# Patient Record
Sex: Female | Born: 1960 | Race: Black or African American | Hispanic: No | Marital: Married | Smoking: Current every day smoker
Health system: Southern US, Community
[De-identification: ages and names within clinical notes are randomized; demographics above are authoritative.]

## PROBLEM LIST (undated history)

## (undated) DIAGNOSIS — I10 Essential (primary) hypertension: Secondary | ICD-10-CM

## (undated) DIAGNOSIS — E119 Type 2 diabetes mellitus without complications: Secondary | ICD-10-CM

## (undated) DIAGNOSIS — Z8719 Personal history of other diseases of the digestive system: Secondary | ICD-10-CM

## (undated) DIAGNOSIS — K862 Cyst of pancreas: Secondary | ICD-10-CM

## (undated) DIAGNOSIS — Z973 Presence of spectacles and contact lenses: Secondary | ICD-10-CM

## (undated) DIAGNOSIS — Z9289 Personal history of other medical treatment: Secondary | ICD-10-CM

## (undated) DIAGNOSIS — D649 Anemia, unspecified: Secondary | ICD-10-CM

## (undated) HISTORY — PX: TUBAL LIGATION: SHX77

## (undated) HISTORY — PX: ESOPHAGOGASTRODUODENOSCOPY: SHX1529

---

## 2009-04-15 DIAGNOSIS — Z9289 Personal history of other medical treatment: Secondary | ICD-10-CM

## 2009-04-15 HISTORY — DX: Personal history of other medical treatment: Z92.89

## 2010-09-03 ENCOUNTER — Emergency Department (HOSPITAL_COMMUNITY)
Admission: EM | Admit: 2010-09-03 | Discharge: 2010-09-03 | Disposition: A | Discharge status: Home / Self Care | Payer: Medicaid Other | Attending: Emergency Medicine | Admitting: Emergency Medicine

## 2010-09-03 ENCOUNTER — Emergency Department (HOSPITAL_COMMUNITY): Payer: Medicaid Other

## 2010-09-03 DIAGNOSIS — R110 Nausea: Secondary | ICD-10-CM | POA: Insufficient documentation

## 2010-09-03 DIAGNOSIS — I10 Essential (primary) hypertension: Secondary | ICD-10-CM | POA: Insufficient documentation

## 2010-09-03 DIAGNOSIS — R1011 Right upper quadrant pain: Secondary | ICD-10-CM | POA: Insufficient documentation

## 2010-09-03 DIAGNOSIS — R42 Dizziness and giddiness: Secondary | ICD-10-CM | POA: Insufficient documentation

## 2010-09-03 LAB — POCT CARDIAC MARKERS: Troponin i, poc: <0.05 ng/mL (ref 0.00–0.09)

## 2010-09-03 LAB — URINALYSIS, ROUTINE W REFLEX MICROSCOPIC
Ketones, ur: NEGATIVE mg/dL
Nitrite: NEGATIVE
Specific Gravity, Urine: 1.016 (ref 1.005–1.030)
Urobilinogen, UA: 0.2 mg/dL (ref 0.0–1.0)
pH: 5.5 (ref 5.0–8.0)

## 2010-09-03 LAB — BASIC METABOLIC PANEL
BUN: 10 mg/dL (ref 6–23)
CO2: 27 mEq/L (ref 19–32)
Chloride: 102 mEq/L (ref 96–112)
Creatinine, Ser: 0.56 mg/dL (ref 0.4–1.2)
Glucose, Bld: 100 mg/dL — ABNORMAL HIGH (ref 70–99)
Sodium: 139 mEq/L (ref 135–145)

## 2010-09-03 LAB — CBC
HCT: 35.3 % — ABNORMAL LOW (ref 36.0–46.0)
MCH: 24.7 pg — ABNORMAL LOW (ref 26.0–34.0)
MCV: 73.8 fL — ABNORMAL LOW (ref 78.0–100.0)
Platelets: 378 10*3/uL (ref 150–400)
RBC: 4.78 MIL/uL (ref 3.87–5.11)

## 2010-09-03 LAB — LIPASE, BLOOD: Lipase: 16 U/L (ref 11–59)

## 2013-12-06 ENCOUNTER — Emergency Department: Payer: Self-pay

## 2013-12-06 ENCOUNTER — Emergency Department
Admission: EM | Admit: 2013-12-06 | Discharge: 2013-12-06 | Disposition: A | Discharge status: Home / Self Care | Payer: Self-pay | Attending: Emergency Medicine | Admitting: Emergency Medicine

## 2013-12-06 DIAGNOSIS — K0889 Other specified disorders of teeth and supporting structures: Secondary | ICD-10-CM

## 2013-12-06 DIAGNOSIS — I10 Essential (primary) hypertension: Secondary | ICD-10-CM | POA: Insufficient documentation

## 2013-12-06 DIAGNOSIS — F17200 Nicotine dependence, unspecified, uncomplicated: Secondary | ICD-10-CM | POA: Insufficient documentation

## 2013-12-06 DIAGNOSIS — K089 Disorder of teeth and supporting structures, unspecified: Secondary | ICD-10-CM | POA: Insufficient documentation

## 2013-12-06 HISTORY — DX: Essential (primary) hypertension: I10

## 2013-12-06 MED ORDER — OXYCODONE-ACETAMINOPHEN 5-325 MG PO TABS
ORAL_TABLET | ORAL | Status: DC
Start: 2013-12-06 — End: 2014-02-15

## 2013-12-06 MED ORDER — OXYCODONE-ACETAMINOPHEN 5-325 MG PO TABS
1.0000 | ORAL_TABLET | Freq: Once | ORAL | Status: AC
Start: 2013-12-06 — End: 2013-12-06
  Administered 2013-12-06: 1 via ORAL
  Filled 2013-12-06: qty 1

## 2013-12-06 NOTE — Discharge Instructions (Signed)
Toothache     You have been seen for a toothache.     A toothache happens when the nerve of the tooth gets irritated. Infection, trauma, decay or cavities may cause this irritation. There may be pain after a tooth is lost (from trauma or being pulled).     Symptoms may include:  · Pain with chewing.  · Sensitivity to hot and cold.  · The gums may get beefy red or inflamed in color.     Treatment depends on the toothache's cause. Follow up with a dentist right away for cavities and chipped teeth. Also see a dentist right away for post-extraction (after pulling) pain. Non-steroidal anti-inflammatory medicines (ibuprofen (Advil® or Motrin®); naproxen (Aleve®, Naprosyn®), etc.) can usually treat dental pain. Often, simple toothaches do not need narcotic pain medicines.     Emergency and Urgent Care Doctors are not Dentists. Urgent Care and Emergency Department treatment IS NO SUBSTITUTE for treatment by a licensed dentist. Follow up with a dentist right away and plan to see your dentist on a regular schedule.     YOU SHOULD SEEK MEDICAL ATTENTION IMMEDIATELY, EITHER HERE OR AT THE NEAREST EMERGENCY DEPARTMENT, IF ANY OF THE FOLLOWING OCCURS:  · Swelling of the face, neck, and cheeks.  · High fever (temperature higher than 100.4ºF / 38ºC), chills, vomiting, signs of dehydration.  · You can’t swallow your saliva (spit) or medicine.

## 2013-12-06 NOTE — ED Notes (Signed)
C/o dental pain onset 1wk ago interm - recently moved to area 2 months ago from [facility]. C/o R sided upper/lower molar pain

## 2013-12-07 NOTE — ED Provider Notes (Signed)
Physician/Midlevel provider first contact with patient: 12/06/13 1745         History     Chief Complaint   Patient presents with   . Dental Pain     HPI     53 yo F w/ h/o hypertension who presents w/ dental pain.  Patient states relocated to [place] about 2 months ago from [facility].  States has not seen dentist in "years"  States over past 3 days worsening R lower jaw pain.  Denies fevers chills.  Denies STS to OP.       Past Medical History   Diagnosis Date   . Hypertension        Past Surgical History   Procedure Laterality Date   . Cesarean section         No family history on file.    Social  History   Substance Use Topics   . Smoking status: Current Every Day Smoker -- 0.10 packs/day     Types: Cigarettes   . Smokeless tobacco: Not on file   . Alcohol Use: Yes      Comment: socially       .     No Known Allergies    Discharge Medication List as of 12/06/2013  5:54 PM      CONTINUE these medications which have NOT CHANGED    Details   acetaminophen (TYLENOL) 500 MG tablet Take 500 mg by mouth., Until Discontinued, Historical Med              Review of Systems   HENT: Positive for dental problem.    All other systems reviewed and are negative.      Physical Exam    BP: (!) 169/98 mmHg, Heart Rate: 90, Temp: 99.4 F (37.4 C), Resp Rate: 18, SpO2: 99 %, Weight: 84.823 kg    Physical Exam   Constitutional: She is oriented to person, place, and time. She appears well-developed and well-nourished. No distress.   Noted BP 169/98 d/w patient   HENT:   Head: Normocephalic and atraumatic.   Poor dentition  Multiple dental caries - including 3 to R lower jaw  No apical abscess   Eyes: Pupils are equal, round, and reactive to light.   Neck: Normal range of motion.   Cardiovascular: Normal rate and regular rhythm.    Pulmonary/Chest: Effort normal and breath sounds normal.   Abdominal: Soft. There is no tenderness.   Musculoskeletal: Normal range of motion.   Neurological: She is alert and oriented to person, place, and time.    Skin: Skin is warm and dry. She is not diaphoretic.   Psychiatric: She has a normal mood and affect.   Nursing note and vitals reviewed.      MDM and ED Course     ED Medication Orders     Start     Status Ordering Provider    12/06/13 1754  oxyCODONE-acetaminophen (PERCOCET) 5-325 MG per tablet 1 tablet   Once     Route: Oral  Ordered Dose: 1 tablet     Last MAR action:  Given Jayleene Glaeser TODD           MDM  Number of Diagnoses or Management Options  Pain, dental:   Diagnosis management comments: Medical Decision Making      Presumptive Diagnosis: hypertension, dental caries    Treatment Plan: home, see patient instructions for treatment and plan    I reviewed the vital signs, nursing notes, past medical history,  past surgical history, family history and social history.  No att. providers found    Vital Signs - BP 169/98 mmHg  Pulse 90  Temp(Src) 99.4 F (37.4 C)  Resp 18  Ht 1.575 m  Wt 84.823 kg  BMI 34.19 kg/m2  SpO2 99%    Pulse Oximetry Analysis -  Normal    Differential Diagnosis (not completely inclusive): dental caries, apical abscess, hypertension    Laboratory results reviewed by EDP: No    Radiologic study results reviewed by EDP: No    Radiologic Studies Interpreted (viewed) by EDP: No              Procedures    Clinical Impression & Disposition     Clinical Impression  Final diagnoses:   Pain, dental        ED Disposition     Discharge Stacy Mckay discharge to home/self care.    Condition at disposition: Stable             Discharge Medication List as of 12/06/2013  5:54 PM      START taking these medications    Details   oxyCODONE-acetaminophen (PERCOCET) 5-325 MG per tablet 1-2 tablets by mouth every 4-6 hours as needed for pain;  Do not drive or operate machinery while taking this medicine, Print                       Olevia Bowens, MD  12/07/13 [phone]

## 2013-12-07 NOTE — ED Notes (Signed)
F/U call: doing well.  No questions.

## 2014-02-15 ENCOUNTER — Emergency Department: Payer: Self-pay

## 2014-02-15 ENCOUNTER — Emergency Department
Admission: EM | Admit: 2014-02-15 | Discharge: 2014-02-15 | Disposition: A | Discharge status: Home / Self Care | Payer: Self-pay | Attending: Emergency Medical Services | Admitting: Emergency Medical Services

## 2014-02-15 DIAGNOSIS — T783XXA Angioneurotic edema, initial encounter: Secondary | ICD-10-CM | POA: Insufficient documentation

## 2014-02-15 DIAGNOSIS — IMO0001 Reserved for inherently not codable concepts without codable children: Secondary | ICD-10-CM

## 2014-02-15 DIAGNOSIS — F17210 Nicotine dependence, cigarettes, uncomplicated: Secondary | ICD-10-CM | POA: Insufficient documentation

## 2014-02-15 DIAGNOSIS — I10 Essential (primary) hypertension: Secondary | ICD-10-CM | POA: Insufficient documentation

## 2014-02-15 MED ORDER — VALSARTAN 40 MG PO TABS
40.0000 mg | ORAL_TABLET | Freq: Two times a day (BID) | ORAL | Status: AC
Start: 2014-02-15 — End: ?

## 2014-02-15 MED ORDER — PREDNISONE 20 MG PO TABS
60.0000 mg | ORAL_TABLET | Freq: Every day | ORAL | Status: AC
Start: 2014-02-15 — End: 2014-02-20

## 2014-02-15 MED ORDER — METHYLPREDNISOLONE SODIUM SUCC 125 MG IJ SOLR
125.0000 mg | Freq: Once | INTRAMUSCULAR | Status: AC
Start: 2014-02-15 — End: 2014-02-15
  Administered 2014-02-15: 125 mg via INTRAVENOUS
  Filled 2014-02-15: qty 2

## 2014-02-15 MED ORDER — DIPHENHYDRAMINE HCL 50 MG/ML IJ SOLN
25.0000 mg | Freq: Once | INTRAMUSCULAR | Status: AC
Start: 2014-02-15 — End: 2014-02-15
  Administered 2014-02-15: 25 mg via INTRAVENOUS
  Filled 2014-02-15: qty 1

## 2014-02-15 MED ORDER — FAMOTIDINE 10 MG/ML IV SOLN (WRAP)
20.0000 mg | Freq: Once | INTRAVENOUS | Status: AC
Start: 2014-02-15 — End: 2014-02-15
  Administered 2014-02-15: 20 mg via INTRAVENOUS
  Filled 2014-02-15: qty 2

## 2014-02-15 NOTE — Discharge Instructions (Signed)
Dear Ms.  Stacy Mckay:    I appreciate your choosing the [facility] for your healthcare needs, and hope your visit today was EXCELLENT.    Instructions:  Please follow-up with the clinics from the list provided in the next week.    Please take:      Benadryl 25 mg every 6-8 hours as needed until swelling resolved.      Pepsin 20 mg, 1 time per day until swelling resolves.     Return to the Emergency Department for any worsening symptoms or concerns.    Below is some information that our patients often find helpful.    We wish you good health and please do not hesitate to contact us if we can ever be of any assistance.    Sincerely,  Pamala Hurry, MD  [facility]    ________________________________________________________________    If you do not continue to improve or your condition worsens, please contact your doctor or return immediately to the Emergency Department.    Thank you for choosing [facility] for your emergency care needs.  We strive to provide EXCELLENT care to you and your family.      DOCTOR REFERRALS  Call [phone] if you need any further referrals and we can help you find a primary care doctor or specialist.  Also, available online at:  [email or web address]    YOUR CONTACT INFORMATION  Before leaving please check with registration to make sure we have an up-to-date contact number.  You can call registration at [phone] to update your information.  For questions about your hospital bill, please call [phone].  For questions about your Emergency Dept Physician bill please call [phone].      FREE HEALTH SERVICES  If you need help with health or social services, please call 2-1-1 for a free referral to resources in your area.  2-1-1 is a free service connecting people with information on health insurance, free clinics, pregnancy, mental health, dental care, food assistance, housing, and  substance abuse counseling.  Also, available online at:  http://www.[facility].org    MEDICAL RECORDS AND TESTS  Certain laboratory test results do not come back the same day, for example urine cultures.   We will contact you if other important findings are noted.  Radiology films are often reviewed again to ensure accuracy.  If there is any discrepancy, we will notify you.      Please call [phone] to pick up a complimentary CD of any radiology studies performed.  If you or your doctor would like to request a copy of your medical records, please call [phone].      ORTHOPEDIC INJURY   Please know that significant injuries can exist even when an initial x-ray is read as normal or negative.  This can occur because some fractures (broken bones) are not initially visible on x-rays.  For this reason, close outpatient follow-up with your primary care doctor or bone specialist (orthopedist) is required.    MEDICATIONS AND FOLLOWUP  Please be aware that some prescription medications can cause drowsiness.  Use caution when driving or operating machinery.    The examination and treatment you have received in our Emergency Department is provided on an emergency basis, and is not intended to be a substitute for your primary care physician.  It is important that your doctor checks you again and that you report any new or remaining problems  at that time.      [place], [place], [place] [address] (1.4 miles, 7 minutes)  [place], [place], [place] [address] (6.5 miles, 13 minutes)  Handout with directions available on request

## 2014-02-15 NOTE — ED Provider Notes (Signed)
Physician/Midlevel provider first contact with patient: 02/15/14 0745         [facility] EMERGENCY DEPARTMENT HISTORY AND PHYSICAL EXAM    Patient Name: Stacy Mckay, Stacy Mckay  Encounter Date:  02/15/2014  Rendering Provider: Pete Glatter, MD  Patient DOB:  07/18/1960  MRN:  [id number]    History of Presenting Illness     Historian: Pt    53 y.o. female h/o HTN p/w gradual onset of moderate generalized facial swelling in pt's lips and bilateral cheeks since yesterday.  Associated with slight feeling of swelling in her upper throat.  Pt has taken Ibuprofen and Aleve with no relief.  Pt notes she went to [employer] and ate chicken and mashed potatoes 2 days ago. Pt reports she does not take any medications.  No h/o similar swelling. No fever.        PMD:  Pcp, Noneorunknown, MD    Past Medical History     Past Medical History   Diagnosis Date   . Hypertension      Dx'd with HTN per pt/non-complaint with medication regime-02/15/14       Past Surgical History     Past Surgical History   Procedure Laterality Date   . Cesarean section         Family History     History reviewed. No pertinent family history.    Social History     History     Social History   . Marital Status: Married     Spouse Name: N/A     Number of Children: N/A   . Years of Education: N/A     Social History Main Topics   . Smoking status: Current Every Day Smoker -- 0.10 packs/day     Types: Cigarettes   . Smokeless tobacco: Not on file   . Alcohol Use: Yes      Comment: socially   . Drug Use: No   . Sexual Activity: Not on file     Other Topics Concern   . Not on file     Social History Narrative       Home Medications     Home medications reviewed by ED MD     Previous Medications    No medications on file       Review of Systems       Constitutional:  No fever  ENT: +generalized facial swelling, +slight feeling of throat swelling  All other systems reviewed and negative      Physical Exam     BP 162/80 mmHg  Pulse 67  Temp(Src) 99.3 F  (37.4 C)  Resp 18  Ht 1.575 m  Wt 90.266 kg  BMI 36.39 kg/m2  SpO2 96%    CONSTITUTIONAL   Patient is afebrile, Vital Signs Reviewed,Well appearing, Patient appears comfortable, Alert.  HEAD   Normocephalic, Moderate angioedema of her lips and cheeks, Slightly present under her face on left mandible.  EYES   Eyes are normal to inspection, No discharge from eyes, Sclera are normal,  ENT  Moist oral mucosa, Voice is normal.  NECK   Normal ROM, No jugular venous distention, Normal inspection.  RESPIRATORY CHEST    Breath sounds normal, No respiratory distress, No apparent shortness of breath.   CARDIOVASCULAR   RRR, Heart sounds normal, Normal S1 S2.  UPPER EXTREMITY   Inspection normal, No cyanosis, No clubbing, No edema.  NEURO   GCS is 15, No focal motor deficits. Speech is normal, appropriate  SKIN   Skin is warm, Skin is dry, Skin is normal color.  PSYCHIATRIC   Oriented X 3, Normal affect.      ED Medications Administered     ED Medication Orders     Start     Status Ordering Provider    02/15/14 0759  methylprednisolone sodium succinate (Solu-MEDROL) injection 125 mg   Once     Route: Intravenous  Ordered Dose: 125 mg     Last MAR action:  Given Avina Eberle S    02/15/14 0759  diphenhydrAMINE (BENADRYL) injection 25 mg   Once     Route: Intravenous  Ordered Dose: 25 mg     Last MAR action:  Given Zoi Devine S    02/15/14 0759  famotidine (PEPCID) injection 20 mg   Once     Route: Intravenous  Ordered Dose: 20 mg     Last MAR action:  Given Elyn Krogh S          Orders Placed During This Encounter   No orders of the defined types were placed in this encounter.       Diagnostic Study Results     The results of the diagnostic studies below were reviewed by the ED provider:    Labs  Results     ** No results found for the last 24 hours. **          Radiologic Studies  Radiology Results (24 Hour)     ** No results found for the last 24 hours. **          Scribe and MD Attestations     I,  Pete Glatter, MD, personally performed the services documented. Max Servando Snare is scribing for me on [place],Kaula G. I reviewed and confirm the accuracy of the information in this medical record.    I, Max Ruge, am serving as a Neurosurgeon to document services personally performed by Pete Glatter, MD, based on the provider's statements to me.     Rendering Provider: Pete Glatter, MD    Monitors, EKG, Critical Care, and Splints     EKG (interpreted by ED physician): na  Cardiac Monitor (interpreted by ED physician): na    Critical Care:   Splint check:      MDM and Clinical Notes     Notes:    11:20AM: Pt reports she has been on diovan in the past, and she requests a refill of the prescription.    Consults:    Diagnosis and Disposition     Clinical Impression  1. Angioedema, initial encounter    2. Elevated blood pressure        Disposition  ED Disposition     Discharge [address] discharge to home/self care.    Condition at disposition: Stable            Prescriptions       New Prescriptions    PREDNISONE (DELTASONE) 20 MG TABLET    Take 3 tablets (60 mg total) by mouth daily.    VALSARTAN (DIOVAN) 40 MG TABLET    Take 1 tablet (40 mg total) by mouth 2 (two) times daily.               Pamala Hurry, MD  02/16/14 1041

## 2014-02-15 NOTE — ED Notes (Signed)
Edema to eyelids and upper lip decreased significantly.

## 2014-02-15 NOTE — ED Notes (Signed)
Pt to ED with co's of swelling to lips, perioribital area and bil cheeks. Symptoms started last night and more swelling noted this AM. Pt denies difficulty with breathing. No respiratory issues. Pain rated 810.

## 2018-04-22 ENCOUNTER — Other Ambulatory Visit: Payer: Self-pay

## 2018-04-22 ENCOUNTER — Encounter: Payer: Self-pay | Admitting: Emergency Medicine

## 2018-04-22 DIAGNOSIS — K859 Acute pancreatitis without necrosis or infection, unspecified: Principal | ICD-10-CM | POA: Diagnosis present

## 2018-04-22 DIAGNOSIS — I10 Essential (primary) hypertension: Secondary | ICD-10-CM | POA: Diagnosis present

## 2018-04-22 DIAGNOSIS — E8881 Metabolic syndrome: Secondary | ICD-10-CM | POA: Diagnosis present

## 2018-04-22 DIAGNOSIS — K861 Other chronic pancreatitis: Secondary | ICD-10-CM | POA: Diagnosis present

## 2018-04-22 DIAGNOSIS — K862 Cyst of pancreas: Secondary | ICD-10-CM | POA: Diagnosis present

## 2018-04-22 DIAGNOSIS — E119 Type 2 diabetes mellitus without complications: Secondary | ICD-10-CM | POA: Diagnosis present

## 2018-04-22 DIAGNOSIS — K863 Pseudocyst of pancreas: Secondary | ICD-10-CM | POA: Diagnosis present

## 2018-04-22 DIAGNOSIS — F17200 Nicotine dependence, unspecified, uncomplicated: Secondary | ICD-10-CM | POA: Diagnosis present

## 2018-04-22 DIAGNOSIS — E876 Hypokalemia: Secondary | ICD-10-CM | POA: Diagnosis present

## 2018-04-22 DIAGNOSIS — K8020 Calculus of gallbladder without cholecystitis without obstruction: Secondary | ICD-10-CM | POA: Diagnosis present

## 2018-04-22 LAB — COMPREHENSIVE METABOLIC PANEL
ALK PHOS: 101 U/L (ref 38–126)
ALT: 12 U/L (ref 0–44)
ANION GAP: 10 (ref 5–15)
AST: 22 U/L (ref 15–41)
Albumin: 3.9 g/dL (ref 3.5–5.0)
BUN: 14 mg/dL (ref 6–20)
CALCIUM: 8.8 mg/dL — AB (ref 8.9–10.3)
CO2: 21 mmol/L — AB (ref 22–32)
Chloride: 106 mmol/L (ref 98–111)
Creatinine, Ser: 0.77 mg/dL (ref 0.44–1.00)
GFR calc non Af Amer: >60 mL/min (ref >60)
Glucose, Bld: 342 mg/dL — ABNORMAL HIGH (ref 70–99)
Potassium: 3.3 mmol/L — ABNORMAL LOW (ref 3.5–5.1)
SODIUM: 137 mmol/L (ref 135–145)
Total Bilirubin: 0.4 mg/dL (ref 0.3–1.2)
Total Protein: 7.9 g/dL (ref 6.5–8.1)

## 2018-04-22 LAB — CBC WITH DIFFERENTIAL/PLATELET
Abs Immature Granulocytes: 0.04 10*3/uL (ref 0.00–0.07)
BASOS PCT: 1 %
Basophils Absolute: 0.1 10*3/uL (ref 0.0–0.1)
EOS PCT: 0 %
Eosinophils Absolute: 0 10*3/uL (ref 0.0–0.5)
HCT: 40 % (ref 36.0–46.0)
HEMOGLOBIN: 13.5 g/dL (ref 12.0–15.0)
Immature Granulocytes: 0 %
Lymphocytes Relative: 14 %
Lymphs Abs: 1.5 10*3/uL (ref 0.7–4.0)
MCH: 27.6 pg (ref 26.0–34.0)
MCHC: 33.8 g/dL (ref 30.0–36.0)
MCV: 81.6 fL (ref 80.0–100.0)
MONO ABS: 0.4 10*3/uL (ref 0.1–1.0)
Monocytes Relative: 4 %
Neutro Abs: 8.9 10*3/uL — ABNORMAL HIGH (ref 1.7–7.7)
Neutrophils Relative %: 81 %
PLATELETS: 384 10*3/uL (ref 150–400)
RBC: 4.9 MIL/uL (ref 3.87–5.11)
RDW: 13.6 % (ref 11.5–15.5)
WBC: 10.9 10*3/uL — AB (ref 4.0–10.5)
nRBC: 0 % (ref 0.0–0.2)

## 2018-04-22 LAB — LIPASE, BLOOD: LIPASE: 296 U/L — AB (ref 11–51)

## 2018-04-22 LAB — URINALYSIS, COMPLETE (UACMP) WITH MICROSCOPIC
Bacteria, UA: NONE SEEN
Bilirubin Urine: NEGATIVE
Ketones, ur: NEGATIVE mg/dL
Leukocytes, UA: NEGATIVE
Nitrite: NEGATIVE
PH: 5 (ref 5.0–8.0)
Protein, ur: NEGATIVE mg/dL
Specific Gravity, Urine: 1.016 (ref 1.005–1.030)

## 2018-04-22 NOTE — ED Triage Notes (Signed)
Patient ambulatory to triage with steady gait, without difficulty or distress noted; pt reports lower abd pain since 4pm with no accomp symptoms

## 2018-04-23 ENCOUNTER — Other Ambulatory Visit: Payer: Self-pay

## 2018-04-23 ENCOUNTER — Inpatient Hospital Stay
Admission: EM | Admit: 2018-04-23 | Discharge: 2018-04-29 | DRG: 439 | Disposition: A | Discharge status: Home / Self Care | Payer: Self-pay | Attending: Internal Medicine | Admitting: Internal Medicine

## 2018-04-23 ENCOUNTER — Emergency Department: Payer: Self-pay

## 2018-04-23 ENCOUNTER — Encounter: Payer: Self-pay | Admitting: Emergency Medicine

## 2018-04-23 DIAGNOSIS — K861 Other chronic pancreatitis: Secondary | ICD-10-CM

## 2018-04-23 DIAGNOSIS — R0602 Shortness of breath: Secondary | ICD-10-CM

## 2018-04-23 DIAGNOSIS — K8689 Other specified diseases of pancreas: Secondary | ICD-10-CM | POA: Diagnosis present

## 2018-04-23 DIAGNOSIS — J9859 Other diseases of mediastinum, not elsewhere classified: Secondary | ICD-10-CM

## 2018-04-23 DIAGNOSIS — K859 Acute pancreatitis without necrosis or infection, unspecified: Principal | ICD-10-CM

## 2018-04-23 HISTORY — DX: Essential (primary) hypertension: I10

## 2018-04-23 HISTORY — DX: Personal history of other diseases of the digestive system: Z87.19

## 2018-04-23 LAB — HEMOGLOBIN A1C
Hgb A1c MFr Bld: 8.8 % — ABNORMAL HIGH (ref 4.8–5.6)
Mean Plasma Glucose: 205.86 mg/dL

## 2018-04-23 LAB — GLUCOSE, CAPILLARY
Glucose-Capillary: 175 mg/dL — ABNORMAL HIGH (ref 70–99)
Glucose-Capillary: 149 mg/dL — ABNORMAL HIGH (ref 70–99)

## 2018-04-23 LAB — TSH: TSH: 0.454 u[IU]/mL (ref 0.350–4.500)

## 2018-04-23 MED ORDER — INSULIN ASPART 100 UNIT/ML ~~LOC~~ SOLN: 0.0000 [IU] | SUBCUTANEOUS | Status: DC

## 2018-04-23 MED ORDER — IOPAMIDOL (ISOVUE-300) INJECTION 61%
100.0000 mL | Freq: Once | INTRAVENOUS | Status: AC | PRN
  Administered 2018-04-23: 100 mL via INTRAVENOUS

## 2018-04-23 MED ORDER — MORPHINE SULFATE (PF) 4 MG/ML IV SOLN
4.0000 mg | Freq: Once | INTRAVENOUS | Status: AC
  Administered 2018-04-23: 4 mg via INTRAVENOUS
  Filled 2018-04-23: qty 1

## 2018-04-23 MED ORDER — ONDANSETRON HCL 4 MG/2ML IJ SOLN
4.0000 mg | Freq: Four times a day (QID) | INTRAMUSCULAR | Status: DC | PRN
  Administered 2018-04-23 – 2018-04-26 (×3): 4 mg via INTRAVENOUS
  Filled 2018-04-23 (×3): qty 2

## 2018-04-23 MED ORDER — HYDRALAZINE HCL 20 MG/ML IJ SOLN
10.0000 mg | Freq: Four times a day (QID) | INTRAMUSCULAR | Status: DC | PRN
  Administered 2018-04-26: 10 mg via INTRAVENOUS
  Filled 2018-04-23: qty 1

## 2018-04-23 MED ORDER — ACETAMINOPHEN 325 MG PO TABS
650.0000 mg | ORAL_TABLET | Freq: Four times a day (QID) | ORAL | Status: DC | PRN
  Administered 2018-04-23 – 2018-04-29 (×6): 650 mg via ORAL
  Filled 2018-04-23 (×6): qty 2

## 2018-04-23 MED ORDER — INSULIN ASPART 100 UNIT/ML ~~LOC~~ SOLN: 0.0000 [IU] | Freq: Every day | SUBCUTANEOUS | Status: DC

## 2018-04-23 MED ORDER — HYDROMORPHONE HCL 1 MG/ML IJ SOLN
0.5000 mg | INTRAMUSCULAR | Status: DC | PRN
  Administered 2018-04-23 – 2018-04-26 (×14): 0.5 mg via INTRAVENOUS
  Filled 2018-04-23 (×14): qty 1

## 2018-04-23 MED ORDER — AMLODIPINE BESYLATE 5 MG PO TABS
5.0000 mg | ORAL_TABLET | Freq: Every day | ORAL | Status: DC
  Administered 2018-04-24 – 2018-04-25 (×2): 5 mg via ORAL
  Filled 2018-04-23 (×4): qty 1

## 2018-04-23 MED ORDER — ONDANSETRON HCL 4 MG PO TABS: 4.0000 mg | ORAL_TABLET | Freq: Four times a day (QID) | ORAL | Status: DC | PRN

## 2018-04-23 MED ORDER — ONDANSETRON HCL 4 MG/2ML IJ SOLN
4.0000 mg | INTRAMUSCULAR | Status: AC
  Administered 2018-04-23: 4 mg via INTRAVENOUS
  Filled 2018-04-23: qty 2

## 2018-04-23 MED ORDER — ACETAMINOPHEN 650 MG RE SUPP: 650.0000 mg | Freq: Four times a day (QID) | RECTAL | Status: DC | PRN

## 2018-04-23 MED ORDER — SODIUM CHLORIDE 0.9 % IV SOLN
INTRAVENOUS | Status: DC
  Administered 2018-04-23 – 2018-04-24 (×5): via INTRAVENOUS

## 2018-04-23 MED ORDER — INSULIN ASPART 100 UNIT/ML ~~LOC~~ SOLN
0.0000 [IU] | Freq: Four times a day (QID) | SUBCUTANEOUS | Status: DC
  Administered 2018-04-23: 3 [IU] via SUBCUTANEOUS
  Administered 2018-04-23 – 2018-04-24 (×2): 2 [IU] via SUBCUTANEOUS
  Filled 2018-04-23 (×3): qty 1

## 2018-04-23 MED ORDER — DOCUSATE SODIUM 100 MG PO CAPS
100.0000 mg | ORAL_CAPSULE | Freq: Two times a day (BID) | ORAL | Status: DC
  Administered 2018-04-23 – 2018-04-29 (×7): 100 mg via ORAL
  Filled 2018-04-23 (×7): qty 1

## 2018-04-23 NOTE — H&P (Signed)
Amanda Hahn is an 58 y.o. female.   Chief Complaint: Abdominal pain HPI: The patient with past medical history of pancreatitis presents to the emergency department complaining of abdominal pain, nausea and vomiting that began hours prior to admission.  The patient reports that she has not had anything to drink since Christmas.  (She admits to being a heavy drinker in the past).  Patient also reports episodes of pancreatitis in the past where a mass was seen on her CT scan but she had not followed up.  Laboratory evaluation revealed lipase 296.  The patient was given pain medicine and started on intravenous fluid prior to the emergency department staff called the hospitalist service for admission.  Past Medical History:  Diagnosis Date  . History of pancreatitis   . Hypertension     Past Surgical History:  Procedure Laterality Date  . CESAREAN SECTION     x2    Family History  Problem Relation Age of Onset  . CAD Mother    Social History:  reports that she has been smoking. She has never used smokeless tobacco. She reports current alcohol use. No history on file for drug.  Allergies: No Known Allergies  Prior to Admission medications   Not on File     Results for orders placed or performed during the hospital encounter of 04/23/18 (from the past 48 hour(s))  CBC with Differential     Status: Abnormal   Collection Time: 04/22/18 10:16 PM  Result Value Ref Range   WBC 10.9 (H) 4.0 - 10.5 K/uL   RBC 4.90 3.87 - 5.11 MIL/uL   Hemoglobin 13.5 12.0 - 15.0 g/dL   HCT 40.0 36.0 - 46.0 %   MCV 81.6 80.0 - 100.0 fL   MCH 27.6 26.0 - 34.0 pg   MCHC 33.8 30.0 - 36.0 g/dL   RDW 13.6 11.5 - 15.5 %   Platelets 384 150 - 400 K/uL   nRBC 0.0 0.0 - 0.2 %   Neutrophils Relative % 81 %   Neutro Abs 8.9 (H) 1.7 - 7.7 K/uL   Lymphocytes Relative 14 %   Lymphs Abs 1.5 0.7 - 4.0 K/uL   Monocytes Relative 4 %   Monocytes Absolute 0.4 0.1 - 1.0 K/uL   Eosinophils Relative 0 %   Eosinophils  Absolute 0.0 0.0 - 0.5 K/uL   Basophils Relative 1 %   Basophils Absolute 0.1 0.0 - 0.1 K/uL   Immature Granulocytes 0 %   Abs Immature Granulocytes 0.04 0.00 - 0.07 K/uL    Comment: Performed at [facility], [address] [place], [place] [address]  Comprehensive metabolic panel     Status: Abnormal   Collection Time: 04/22/18 10:16 PM  Result Value Ref Range   Sodium 137 135 - 145 mmol/L   Potassium 3.3 (L) 3.5 - 5.1 mmol/L   Chloride 106 98 - 111 mmol/L   CO2 21 (L) 22 - 32 mmol/L   Glucose, Bld 342 (H) 70 - 99 mg/dL   BUN 14 6 - 20 mg/dL   Creatinine, Ser 0.77 0.44 - 1.00 mg/dL   Calcium 8.8 (L) 8.9 - 10.3 mg/dL   Total Protein 7.9 6.5 - 8.1 g/dL   Albumin 3.9 3.5 - 5.0 g/dL   AST 22 15 - 41 U/L   ALT 12 0 - 44 U/L   Alkaline Phosphatase 101 38 - 126 U/L   Total Bilirubin 0.4 0.3 - 1.2 mg/dL   GFR calc non Af Amer >60 >  60 mL/min   GFR calc Af Amer >60 >60 mL/min   Anion gap 10 5 - 15    Comment: Performed at [facility], [address] [place], [place] [address]  Lipase, blood     Status: Abnormal   Collection Time: 04/22/18 10:16 PM  Result Value Ref Range   Lipase 296 (H) 11 - 51 U/L    Comment: Performed at [facility], [address] [place], [place] [address]  Urinalysis, Complete w Microscopic     Status: Abnormal   Collection Time: 04/22/18 10:16 PM  Result Value Ref Range   Color, Urine YELLOW (A) YELLOW   APPearance CLEAR (A) CLEAR   Specific Gravity, Urine 1.016 1.005 - 1.030   pH 5.0 5.0 - 8.0   Glucose, UA >=500 (A) NEGATIVE mg/dL   Hgb urine dipstick SMALL (A) NEGATIVE   Bilirubin Urine NEGATIVE NEGATIVE   Ketones, ur NEGATIVE NEGATIVE mg/dL   Protein, ur NEGATIVE NEGATIVE mg/dL   Nitrite NEGATIVE NEGATIVE   Leukocytes, UA NEGATIVE NEGATIVE   RBC / HPF 0-5 0 - 5 RBC/hpf   WBC, UA 0-5 0 - 5 WBC/hpf   Bacteria, UA NONE SEEN NONE SEEN   Squamous Epithelial / LPF 0-5 0 - 5   Mucus PRESENT     Comment: Performed at  [facility], [address], [place], [place] [address]   Ct Abdomen Pelvis W Contrast  Result Date: 04/23/2018 CLINICAL DATA:  Lower abdominal pain since 4 p.m. One episode of vomiting today. EXAM: CT ABDOMEN AND PELVIS WITH CONTRAST TECHNIQUE: Multidetector CT imaging of the abdomen and pelvis was performed using the standard protocol following bolus administration of intravenous contrast. CONTRAST:  140mL ISOVUE-300 IOPAMIDOL (ISOVUE-300) INJECTION 61% COMPARISON:  None. FINDINGS: Lower chest: Mass indistinguishable from the lower esophagus with multiple pockets of rim enhancing fluid. Mass measures up to 6.2 cm with fluid pockets measuring up to 2.3 cm. The regional fat does not appear particularly inflamed. Reportedly the patient is very well appearing on exam and there is no history of chronic dysphagia. Hepatobiliary: Hepatic steatosis.Cholelithiasis with gas containing stone at the fundus. Pancreas: Chronic pancreatitis with areas of calcification and variable main duct dilatation seen at the midline body and tail. No gross mass lesion is seen. There is peripancreatic edema. Spleen: 2 small areas of low-density that are nonspecific and not particularly worrisome. Adrenals/Urinary Tract: 2.2 cm fairly homogeneous left adrenal nodule. No hydronephrosis or stone. Unremarkable bladder. Stomach/Bowel: No obstruction. No appendicitis. Extensive colonic diverticulosis Vascular/Lymphatic: No acute vascular abnormality. No mass or adenopathy. Reproductive:Fibroid uterus. Other: No ascites or pneumoperitoneum.  Fatty midline hernia Musculoskeletal: No acute abnormalities. These results were called by telephone at the time of interpretation on 04/23/2018 at 5:07 am to Dr. Hinda Kehr , who verbally acknowledged these results. IMPRESSION: 1. Acute on chronic pancreatitis. Areas of main duct dilatation is likely related to the chronic pancreatitis, but consider elective MR follow-up. 2. 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 A pseudocyst is favored. Clinical history makes subacute esophageal rupture or abscess unlikely. This would be an unusual appearance for neoplasm. Recommend GI consultation for EGD/EUS consideration. 3. 2.2 cm left adrenal nodule, statistically an adenoma. Attention on follow-up. 4. Extensive colonic diverticulosis. 5. Fibroid uterus. 6. Hepatic steatosis and atherosclerosis. 7. Fatty midline hernia. 8. Cholelithiasis. Electronically Signed   By: Monte Fantasia M.D.   On: 04/23/2018 05:06    Review of Systems  Constitutional: Negative for chills and fever.  HENT: Negative for sore throat and tinnitus.   Eyes: Negative for blurred vision and redness.  Respiratory: Negative for cough and shortness of breath.   Cardiovascular: Negative for chest pain, palpitations, orthopnea and PND.  Gastrointestinal: Positive for abdominal pain, nausea and vomiting. Negative for diarrhea.  Genitourinary: Negative for dysuria, frequency and urgency.  Musculoskeletal: Negative for joint pain and myalgias.  Skin: Negative for rash.       No lesions  Neurological: Negative for speech change, focal weakness and weakness.  Endo/Heme/Allergies: Does not bruise/bleed easily.       No temperature intolerance  Psychiatric/Behavioral: Negative for depression and suicidal ideas.    Blood pressure (!) 155/93, pulse 78, temperature 98.2 F (36.8 C), temperature source Oral, resp. rate 20, height 5\' 2"  (1.575 m), weight 72.6 kg, SpO2 97 %. Physical Exam  Vitals reviewed. Constitutional: She is oriented to person, place, and time. She appears well-developed and well-nourished. No distress.  HENT:  Head: Normocephalic and atraumatic.  Mouth/Throat: Oropharynx is clear and moist.  Eyes: Pupils are equal, round, and reactive to light. Conjunctivae and EOM are normal. No scleral icterus.  Neck: Normal range of motion. Neck supple. No JVD present. No  tracheal deviation present. No thyromegaly present.  Cardiovascular: Normal rate, regular Amanda and normal heart sounds. Exam reveals no gallop and no friction rub.  No murmur heard. Respiratory: Effort normal and breath sounds normal.  GI: Soft. Bowel sounds are normal. She exhibits no distension. There is abdominal tenderness. There is no rebound and no guarding.  Genitourinary:    Genitourinary Comments: Deferred   Musculoskeletal: Normal range of motion.        General: No edema.  Lymphadenopathy:    She has no cervical adenopathy.  Neurological: She is alert and oriented to person, place, and time. No cranial nerve deficit. She exhibits normal muscle tone.  Skin: Skin is warm and dry. No rash noted. No erythema.  Psychiatric: She has a normal mood and affect. Her behavior is normal. Judgment and thought content normal.     Assessment/Plan This is a 58 year old female admitted for pancreatitis. 1.  Pancreatitis: Elevated lipase and abdominal pain.  Manage nausea with antiemetics.  N.p.o. for now.  Hydrate with intravenous fluid.  Consult gastroenterology as base of esophagus is indistinguishable from mediastinal mass/pseudocyst.  Unlikely neoplasm.  Consult VIR for drainage of cyst if recommended by gastroenterology. 2.  Hypertension: Uncontrolled; possibly secondary to pain although the patient does carry this diagnosis.  Labetalol as needed for now.  When patient is able to take p.o. consider amlodipine. 3.  Glucosuria: The patient has more than 500 mg/dL glucose in her urine.  She likely has diabetes.  Check hemoglobin A1c.  Initiate insulin therapy if A1c greater then 6.4. 4.  DVT prophylaxis: SCDs 5.  GI prophylaxis: None for now The patient is a full code.  Time spent on admission orders and patient care approximately 45 minutes  Harrie Foreman, MD 04/23/2018, 7:39 AM

## 2018-04-23 NOTE — Progress Notes (Addendum)
[facility] NAME: Amanda Hahn    MR#:  [id number]  DATE OF BIRTH:  May 09, 1960  SUBJECTIVE:  CHIEF COMPLAINT:   Chief Complaint  Patient presents with  . Abdominal Pain   Patient admitted with acute pancreatitis.  No fevers.  Abdominal pain is improving.  No nausea or vomiting.  REVIEW OF SYSTEMS:  Review of Systems  Constitutional: Negative for chills and fever.  HENT: Negative for hearing loss.   Eyes: Negative for blurred vision and double vision.  Respiratory: Negative for cough and hemoptysis.   Cardiovascular: Negative for chest pain and palpitations.  Gastrointestinal: Positive for abdominal pain. Negative for heartburn and nausea.  Genitourinary: Negative for dysuria and urgency.  Musculoskeletal: Negative for myalgias and neck pain.  Skin: Negative for itching and rash.  Neurological: Negative for dizziness and headaches.  Psychiatric/Behavioral: Negative for depression and hallucinations.    DRUG ALLERGIES:  No Known Allergies VITALS:  Blood pressure (!) 156/87, pulse 80, temperature 98.3 F (36.8 C), temperature source Oral, resp. rate 16, height 5\' 2"  (1.575 m), weight 72.6 kg, SpO2 97 %. PHYSICAL EXAMINATION:   Physical Exam  Constitutional: She is oriented to person, place, and time. She appears well-developed and well-nourished.  HENT:  Head: Normocephalic and atraumatic.  Eyes: Pupils are equal, round, and reactive to light. Conjunctivae and EOM are normal.  Neck: Normal range of motion. Neck supple.  Cardiovascular: Normal rate and regular rhythm.  Pulmonary/Chest: Effort normal and breath sounds normal.  Abdominal: Soft. Bowel sounds are normal. There is abdominal tenderness. There is no rebound and no guarding.  Musculoskeletal: Normal range of motion.        General: No edema.  Neurological: She is alert and oriented to person, place, and time.  Skin: Skin is warm.  Psychiatric: She has a  normal mood and affect. Her behavior is normal.   LABORATORY PANEL:  Female CBC Recent Labs  Lab 04/22/18 2216  WBC 10.9*  HGB 13.5  HCT 40.0  PLT 384   ------------------------------------------------------------------------------------------------------------------ Chemistries  Recent Labs  Lab 04/22/18 2216  NA 137  K 3.3*  CL 106  CO2 21*  GLUCOSE 342*  BUN 14  CREATININE 0.77  CALCIUM 8.8*  AST 22  ALT 12  ALKPHOS 101  BILITOT 0.4   RADIOLOGY:  Ct Abdomen Pelvis W Contrast  Result Date: 04/23/2018 CLINICAL DATA:  Lower abdominal pain since 4 p.m. One episode of vomiting today. EXAM: CT ABDOMEN AND PELVIS WITH CONTRAST TECHNIQUE: Multidetector CT imaging of the abdomen and pelvis was performed using the standard protocol following bolus administration of intravenous contrast. CONTRAST:  185mL ISOVUE-300 IOPAMIDOL (ISOVUE-300) INJECTION 61% COMPARISON:  None. FINDINGS: Lower chest: Mass indistinguishable from the lower esophagus with multiple pockets of rim enhancing fluid. Mass measures up to 6.2 cm with fluid pockets measuring up to 2.3 cm. The regional fat does not appear particularly inflamed. Reportedly the patient is very well appearing on exam and there is no history of chronic dysphagia. Hepatobiliary: Hepatic steatosis.Cholelithiasis with gas containing stone at the fundus. Pancreas: Chronic pancreatitis with areas of calcification and variable main duct dilatation seen at the midline body and tail. No gross mass lesion is seen. There is peripancreatic edema. Spleen: 2 small areas of low-density that are nonspecific and not particularly worrisome. Adrenals/Urinary Tract: 2.2 cm fairly homogeneous left adrenal nodule. No hydronephrosis or stone. Unremarkable bladder. Stomach/Bowel: No obstruction. No appendicitis. Extensive colonic diverticulosis Vascular/Lymphatic: No acute vascular  abnormality. No mass or adenopathy. Reproductive:Fibroid uterus. Other: No ascites or  pneumoperitoneum.  Fatty midline hernia Musculoskeletal: No acute abnormalities. These results were called by telephone at the time of interpretation on 04/23/2018 at 5:07 am to Dr. Hinda Kehr , who verbally acknowledged these results. IMPRESSION: 1. Acute on chronic pancreatitis. Areas of main duct dilatation is likely related to the chronic pancreatitis, but consider elective MR follow-up. 2. Multiloculated ~ 6 cm mass in the lower mediastinum extending through the esophageal hiatus. A pseudocyst is favored. Clinical history makes subacute esophageal rupture or abscess unlikely. This would be an unusual appearance for neoplasm. Recommend GI consultation for EGD/EUS consideration. 3. 2.2 cm left adrenal nodule, statistically an adenoma. Attention on follow-up. 4. Extensive colonic diverticulosis. 5. Fibroid uterus. 6. Hepatic steatosis and atherosclerosis. 7. Fatty midline hernia. 8. Cholelithiasis. Electronically Signed   By: Monte Fantasia M.D.   On: 04/23/2018 05:06   ASSESSMENT AND PLAN:   This is a 58 year old female admitted for pancreatitis.  1.    Acute on chronic pancreatitis: Elevated lipase and abdominal pain.   Denies any nausea vomiting at this time.   Continue IV fluid hydration.  Antiemetics.  N.p.o. for now.  Patient admitted this morning. Noted evidence of cholelithiasis with gas containing stone at the fundus on CAT scan. Gastroenterologist consulted already.  Also sent message of findings on CT scan to gastroenterologist Dr Marius Ditch and she recommended getting surgical input.  I have placed a consult to surgeon on call and sent a message.  Follow-up on input and recommendation  2. 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 A pseudocyst is favored. Clinical history makes subacute esophageal rupture or abscess unlikely. This would be an unusual appearance for neoplasm. Recommend GI consultation for EGD/EUS  consideration. Follow-up on GI evaluation.  3.  Hypertension: Placed on Norvasc.  Placed on as needed hydralazine with parameters for systolic blood pressure greater than 160  4.  Glucosuria: The patient has more than 500 mg/dL glucose in her urine.  She likely has diabetes.  Check hemoglobin A1c.  Initiate insulin therapy if A1c greater then 6.4.  4.  DVT prophylaxis: SCDs pending evaluation by GI in case procedure/intervention indicated   All the records are reviewed and case discussed with Care Management/Social Worker. Management plans discussed with the patient, family and they are in agreement.  CODE STATUS: Full Code  TOTAL TIME TAKING CARE OF THIS PATIENT: 36 minutes.   More than 50% of the time was spent in counseling/coordination of care: YES  POSSIBLE D/C IN 3 DAYS, DEPENDING ON CLINICAL CONDITION.   Amanda Hahn M.D on 04/23/2018 at 2:55 PM  Between 7am to 6pm - Pager - [phone]  After 6pm go to [email or web address] - Proofreader  [facility]  Office  [phone]  CC: Primary care physician; Patient, No Pcp Per  Note: This dictation was prepared with Dragon dictation along with smaller phrase technology. Any transcriptional errors that result from this process are unintentional.

## 2018-04-23 NOTE — ED Notes (Signed)
Patient transported to CT 

## 2018-04-23 NOTE — ED Provider Notes (Signed)
[facility] Emergency Department Provider Note  ____________________________________________   First MD Initiated Contact with Patient 04/23/18 0354     (approximate)  I have reviewed the triage vital signs and the nursing notes.   HISTORY  Chief Complaint Abdominal Pain    HPI Amanda Hahn is a 58 y.o. female whose medical history includes a prior episode of pancreatitis and excessive alcohol use although she reports that is not the case anymore.  She presents for evaluation of gradually worsening upper abdominal pain over the last 24 hours.  She has had some nausea but only vomited once just after she arrived to the emergency department.  She reports that the pain is severe and gets worse when she eats or drinks anything.  She reports that she eats a lot of fried and fatty foods because she works at [employer].  She has not had any alcohol for at least 3 weeks.  She has no history of gallbladder disease.  During her prior pancreatitis episode she spent an extended period of time in the hospital.  The symptoms this time are more mild than that.  She denies fever/chills, chest pain, shortness of breath, diarrhea, lower abdominal pain, and dysuria.  Past Medical History:  Diagnosis Date  . History of pancreatitis   . Hypertension     Patient Active Problem List   Diagnosis Date Noted  . Pancreatitis 04/23/2018    Past Surgical History:  Procedure Laterality Date  . CESAREAN SECTION     x2    Prior to Admission medications   Not on File    Allergies Patient has no known allergies.  Family History  Problem Relation Age of Onset  . CAD Mother     Social History Social History   Tobacco Use  . Smoking status: Current Every Day Smoker  . Smokeless tobacco: Never Used  Substance Use Topics  . Alcohol use: Yes    Comment: occasional  . Drug use: Not on file    Review of Systems Constitutional: No fever/chills Eyes: No visual  changes. ENT: No sore throat. Cardiovascular: Denies chest pain. Respiratory: Denies shortness of breath. Gastrointestinal: Abdominal pain with nausea and one episode of vomiting that is been gradually worsening over the last 24 hours. Genitourinary: Negative for dysuria. Musculoskeletal: Negative for neck pain.  Negative for back pain. Integumentary: Negative for rash. Neurological: Negative for headaches, focal weakness or numbness.   ____________________________________________   PHYSICAL EXAM:  VITAL SIGNS: ED Triage Vitals  Enc Vitals Group     BP 04/22/18 2214 (!) 179/100     Pulse Rate 04/22/18 2214 90     Resp 04/22/18 2214 20     Temp 04/22/18 2214 98.2 F (36.8 C)     Temp Source 04/22/18 2214 Oral     SpO2 04/22/18 2214 100 %     Weight 04/22/18 2212 72.6 kg (160 lb)     Height 04/22/18 2212 1.575 m (5\' 2" )     Head Circumference --      Peak Flow --      Pain Score 04/22/18 2212 10     Pain Loc --      Pain Edu? --      Excl. in [place]? --     Constitutional: Alert and oriented. Well appearing and in no acute distress. Eyes: Conjunctivae are normal.  Head: Atraumatic. Nose: No congestion/rhinnorhea. Mouth/Throat: Mucous membranes are moist. Neck: No stridor.  No meningeal signs.   Cardiovascular: Normal rate,  regular rhythm. Good peripheral circulation. Grossly normal heart sounds. Respiratory: Normal respiratory effort.  No retractions. Lungs CTAB. Gastrointestinal: Abdomen is nondistended and soft.  She has tenderness to palpation of the upper abdomen but a negative Murphy sign, no lower abdominal tenderness and no tenderness at McBurney's point in particular. Musculoskeletal: No lower extremity tenderness nor edema. No gross deformities of extremities. Neurologic:  Normal speech and language. No gross focal neurologic deficits are appreciated.  Skin:  Skin is warm, dry and intact. No rash noted. Psychiatric: Mood and affect are normal. Speech and behavior  are normal.  ____________________________________________   LABS (all labs ordered are listed, but only abnormal results are displayed)  Labs Reviewed  CBC WITH DIFFERENTIAL/PLATELET - Abnormal; Notable for the following components:      Result Value   WBC 10.9 (*)    Neutro Abs 8.9 (*)    All other components within normal limits  COMPREHENSIVE METABOLIC PANEL - Abnormal; Notable for the following components:   Potassium 3.3 (*)    CO2 21 (*)    Glucose, Bld 342 (*)    Calcium 8.8 (*)    All other components within normal limits  LIPASE, BLOOD - Abnormal; Notable for the following components:   Lipase 296 (*)    All other components within normal limits  URINALYSIS, COMPLETE (UACMP) WITH MICROSCOPIC - Abnormal; Notable for the following components:   Color, Urine YELLOW (*)    APPearance CLEAR (*)    Glucose, UA >=500 (*)    Hgb urine dipstick SMALL (*)    All other components within normal limits   ____________________________________________  EKG  No indication for EKG ____________________________________________  RADIOLOGY I, Hinda Kehr, personally discussed these images and results by phone with the on-call radiologist and used this discussion as part of my medical decision making.    ED MD interpretation: Acute on chronic pancreatitis with a large mediastinal mass which could represent a pseudocyst.  Official radiology report(s): Ct Abdomen Pelvis W Contrast  Result Date: 04/23/2018 CLINICAL DATA:  Lower abdominal pain since 4 p.m. One episode of vomiting today. EXAM: CT ABDOMEN AND PELVIS WITH CONTRAST TECHNIQUE: Multidetector CT imaging of the abdomen and pelvis was performed using the standard protocol following bolus administration of intravenous contrast. CONTRAST:  185mL ISOVUE-300 IOPAMIDOL (ISOVUE-300) INJECTION 61% COMPARISON:  None. FINDINGS: Lower chest: Mass indistinguishable from the lower esophagus with multiple pockets of rim enhancing fluid. Mass  measures up to 6.2 cm with fluid pockets measuring up to 2.3 cm. The regional fat does not appear particularly inflamed. Reportedly the patient is very well appearing on exam and there is no history of chronic dysphagia. Hepatobiliary: Hepatic steatosis.Cholelithiasis with gas containing stone at the fundus. Pancreas: Chronic pancreatitis with areas of calcification and variable main duct dilatation seen at the midline body and tail. No gross mass lesion is seen. There is peripancreatic edema. Spleen: 2 small areas of low-density that are nonspecific and not particularly worrisome. Adrenals/Urinary Tract: 2.2 cm fairly homogeneous left adrenal nodule. No hydronephrosis or stone. Unremarkable bladder. Stomach/Bowel: No obstruction. No appendicitis. Extensive colonic diverticulosis Vascular/Lymphatic: No acute vascular abnormality. No mass or adenopathy. Reproductive:Fibroid uterus. Other: No ascites or pneumoperitoneum.  Fatty midline hernia Musculoskeletal: No acute abnormalities. These results were called by telephone at the time of interpretation on 04/23/2018 at 5:07 am to Dr. Hinda Kehr , who verbally acknowledged these results. IMPRESSION: 1. Acute on chronic pancreatitis. Areas of main duct dilatation is likely related to the chronic pancreatitis, but  consider elective MR follow-up. 2. Multiloculated ~ 6 cm mass in the lower mediastinum extending through the esophageal hiatus. A pseudocyst is favored. Clinical history makes subacute esophageal rupture or abscess unlikely. This would be an unusual appearance for neoplasm. Recommend GI consultation for EGD/EUS consideration. 3. 2.2 cm left adrenal nodule, statistically an adenoma. Attention on follow-up. 4. Extensive colonic diverticulosis. 5. Fibroid uterus. 6. Hepatic steatosis and atherosclerosis. 7. Fatty midline hernia. 8. Cholelithiasis. Electronically Signed   By: Monte Fantasia M.D.   On: 04/23/2018 05:06     ____________________________________________   PROCEDURES  Critical Care performed: No   Procedure(s) performed:   Procedures   ____________________________________________   INITIAL IMPRESSION / ASSESSMENT AND PLAN / ED COURSE  As part of my medical decision making, I reviewed the following data within the [place] notes reviewed and incorporated, Labs reviewed , EKG interpreted , Old chart reviewed, Discussed with admitting physician , Discussed with radiologist and Notes from prior ED visits    Differential diagnosis includes, but is not limited to, acute pancreatitis with or without complication, biliary disease, acute gastritis either infectious or not, SBO/ileus.  The patient does not appear to be in a significant amount of distress in spite of her report of severe abdominal pain.  She is getting morphine 4 mg IV and Zofran 4 mg IV as well as 1 L normal saline.  Her lipase is elevated at almost 300 which is consistent with pancreatitis.  Her comprehensive metabolic panel is notable for a slightly decreased potassium and a glucose of 342 but with a normal anion gap and no other acute abnormalities.  Urinalysis is unremarkable.  She has no clinically significant leukocytosis with white blood cell count of only 10.9.  I will obtain a CT scan of the abdomen and pelvis with IV contrast.  However if there is no evidence of complication on the CT scan and the patient is able to tolerate a bland diet with oral fluids, she may be appropriate for discharge home and outpatient follow-up.  She agrees with this plan.  I will reassess after the CT scan.  Clinical Course as of Apr 23 730  Thu Apr 23, 2018  0502 The radiologist called me to discuss the CT results.  In addition to acute on chronic pancreatitis, the patient has a large mass that appears most likely to be a pseudocyst either from the prior episode of pancreatitis she described or less likely from the  current 1.  Regardless the radiologist was quite concerned about the appearance of the mass.  He agreed with the plan for admission for GI consult and further evaluation.  He said there is no indication for a CT scan of the chest at this time because the mass was fully evaluated on the abdominal imaging.   [CF]  [phone] Discussed case in person with Dr. Marcille Blanco who will admit.  I am ordering a second round of morphine 4 mg IV and I discussed the results with the patient and her daughter with Dr. Marcille Blanco at bedside.  They agree with the plan as well.   [CF]    Clinical Course User Index [CF] Hinda Kehr, MD    ____________________________________________  FINAL CLINICAL IMPRESSION(S) / ED DIAGNOSES  Final diagnoses:  Acute on chronic pancreatitis ([place])  Mediastinal mass     MEDICATIONS GIVEN DURING THIS VISIT:  Medications  morphine 4 MG/ML injection 4 mg (4 mg Intravenous Given 04/23/18 0409)  ondansetron (ZOFRAN) injection 4 mg (  4 mg Intravenous Given 04/23/18 0408)  iopamidol (ISOVUE-300) 61 % injection 100 mL (100 mLs Intravenous Contrast Given 04/23/18 0426)  morphine 4 MG/ML injection 4 mg (4 mg Intravenous Given 04/23/18 0526)     ED Discharge Orders    None       Note:  This document was prepared using Dragon voice recognition software and may include unintentional dictation errors.    Hinda Kehr, MD 04/23/18 [phone]

## 2018-04-23 NOTE — Consult Note (Signed)
SURGICAL CONSULTATION NOTE   HISTORY OF PRESENT ILLNESS (HPI):  58 y.o. female admitted to the hospital due to acute diverticulitis. Patient reports that started with abdominal pain two days ago. Pain intensified and decided to come to the hospital. Pain does not radiates. No aggravator or alleviator factor. Denies nausea or vomiting. Patient was evaluated at ED and CT scan was done. CT scan shows chronic calcification of the head of pancreas, a large cystic mass most likely a pseudocyst from previous pancreatitis episodes and a stone on the gallbladder. Lipase 296. Since admission patient refers that pain has improved significantly. Denies fever.   Surgery is consulted by Dr. Stark Jock in this context for evaluation and management of acute pancreatitis.  PAST MEDICAL HISTORY (PMH):  Past Medical History:  Diagnosis Date  . History of pancreatitis   . Hypertension      PAST SURGICAL HISTORY ([place]):  Past Surgical History:  Procedure Laterality Date  . CESAREAN SECTION     x2     MEDICATIONS:  Prior to Admission medications   Not on File     ALLERGIES:  No Known Allergies   SOCIAL HISTORY:  Social History   Socioeconomic History  . Marital status: Single    Spouse name: Not on file  . Number of children: Not on file  . Years of education: Not on file  . Highest education level: Not on file  Occupational History  . Not on file  Social Needs  . Financial resource strain: Not on file  . Food insecurity:    Worry: Not on file    Inability: Not on file  . Transportation needs:    Medical: Not on file    Non-medical: Not on file  Tobacco Use  . Smoking status: Current Every Day Smoker  . Smokeless tobacco: Never Used  Substance and Sexual Activity  . Alcohol use: Yes    Comment: occasional  . Drug use: Not on file  . Sexual activity: Not on file  Lifestyle  . Physical activity:    Days per week: Not on file    Minutes per session: Not on file  . Stress: Not on file   Relationships  . Social connections:    Talks on phone: Not on file    Gets together: Not on file    Attends religious service: Not on file    Active member of club or organization: Not on file    Attends meetings of clubs or organizations: Not on file    Relationship status: Not on file  . Intimate partner violence:    Fear of current or ex partner: Not on file    Emotionally abused: Not on file    Physically abused: Not on file    Forced sexual activity: Not on file  Other Topics Concern  . Not on file  Social History Narrative  . Not on file     FAMILY HISTORY:  Family History  Problem Relation Age of Onset  . CAD Mother      REVIEW OF SYSTEMS:  Constitutional: denies weight loss, fever, chills, or sweats  Eyes: denies any other vision changes, history of eye injury  ENT: denies sore throat, hearing problems  Respiratory: denies shortness of breath, wheezing  Cardiovascular: denies chest pain, palpitations  Gastrointestinal: positive for abdominal pain, Negative for nausea and vomiting, or diarrhea Genitourinary: denies burning with urination or urinary frequency Musculoskeletal: denies any other joint pains or cramps  Skin: denies any other rashes  or skin discolorations  Neurological: denies any other headache, dizziness, weakness  Psychiatric: denies any other depression, anxiety   All other review of systems were negative   VITAL SIGNS:  Temp:  [97.9 F (36.6 C)-98.3 F (36.8 C)] 98 F (36.7 C) (01/09 1559) Pulse Rate:  [71-90] 77 (01/09 1559) Resp:  [15-20] 18 (01/09 1559) BP: (128-188)/(70-100) 147/83 (01/09 1559) SpO2:  [95 %-100 %] 98 % (01/09 1559) Weight:  [72.6 kg] 72.6 kg (01/08 2212)     Height: 5\' 2"  (157.5 cm) Weight: 72.6 kg BMI (Calculated): 29.26   PHYSICAL EXAM:  Constitutional:  -- Normal body habitus  -- Awake, alert, and oriented x3  Eyes:  -- Pupils equally round and reactive to light  -- No scleral icterus  Ear, nose, and throat:   -- No jugular venous distension  Pulmonary:  -- No crackles  -- Equal breath sounds bilaterally -- Breathing non-labored at rest Cardiovascular:  -- S1, S2 present  -- No pericardial rubs Gastrointestinal:  -- Abdomen soft, mild tender on epigastric area, non-distended, no guarding or rebound tenderness. Bowel sound present. -- No abdominal masses appreciated, pulsatile or otherwise  Musculoskeletal and Integumentary:  -- Wounds or skin discoloration: None appreciated -- Extremities: B/L UE and LE FROM, hands and feet warm, no edema  Neurologic:  -- Motor function: intact and symmetric -- Sensation: intact and symmetric   Labs:  CBC Latest Ref Rng & Units 04/22/2018 09/03/2010  WBC 4.0 - 10.5 K/uL 10.9(H) 8.6  Hemoglobin 12.0 - 15.0 g/dL 13.5 11.8(L)  Hematocrit 36.0 - 46.0 % 40.0 35.3(L)  Platelets 150 - 400 K/uL 384 378   CMP Latest Ref Rng & Units 04/22/2018 09/03/2010  Glucose 70 - 99 mg/dL 342(H) 100(H)  BUN 6 - 20 mg/dL 14 10  Creatinine 0.44 - 1.00 mg/dL 0.77 0.56  Sodium 135 - 145 mmol/L 137 139  Potassium 3.5 - 5.1 mmol/L 3.3(L) 3.3(L)  Chloride 98 - 111 mmol/L 106 102  CO2 22 - 32 mmol/L 21(L) 27  Calcium 8.9 - 10.3 mg/dL 8.8(L) 10.0  Total Protein 6.5 - 8.1 g/dL 7.9 -  Total Bilirubin 0.3 - 1.2 mg/dL 0.4 -  Alkaline Phos 38 - 126 U/L 101 -  AST 15 - 41 U/L 22 -  ALT 0 - 44 U/L 12 -   Lipase: 296 U/L  Imaging studies:  EXAM: CT ABDOMEN AND PELVIS WITH CONTRAST  TECHNIQUE: Multidetector CT imaging of the abdomen and pelvis was performed using the standard protocol following bolus administration of intravenous contrast.  CONTRAST:  130mL ISOVUE-300 IOPAMIDOL (ISOVUE-300) INJECTION 61%  COMPARISON:  None.  FINDINGS: Lower chest: Mass indistinguishable from the lower esophagus with multiple pockets of rim enhancing fluid. Mass measures up to 6.2 cm with fluid pockets measuring up to 2.3 cm. The regional fat does not appear particularly inflamed.  Reportedly the patient is very well appearing on exam and there is no history of chronic dysphagia.  Hepatobiliary: Hepatic steatosis.Cholelithiasis with gas containing stone at the fundus.  Pancreas: Chronic pancreatitis with areas of calcification and variable main duct dilatation seen at the midline body and tail. No gross mass lesion is seen. There is peripancreatic edema.  Spleen: 2 small areas of low-density that are nonspecific and not particularly worrisome.  Adrenals/Urinary Tract: 2.2 cm fairly homogeneous left adrenal nodule. No hydronephrosis or stone. Unremarkable bladder.  Stomach/Bowel: No obstruction. No appendicitis. Extensive colonic diverticulosis  Vascular/Lymphatic: No acute vascular abnormality. No mass or adenopathy.  Reproductive:Fibroid uterus.  Other: No ascites or pneumoperitoneum.  Fatty midline hernia  Musculoskeletal: No acute abnormalities.  These results were called by telephone at the time of interpretation on 04/23/2018 at 5:07 am to Dr. Hinda Kehr , who verbally acknowledged these results.  IMPRESSION: 1. Acute on chronic pancreatitis. Areas of main duct dilatation is likely related to the chronic pancreatitis, but consider elective MR follow-up. 2. Multiloculated ~ 6 cm mass in the lower mediastinum extending through the esophageal hiatus. A pseudocyst is favored. Clinical history makes subacute esophageal rupture or abscess unlikely. This would be an unusual appearance for neoplasm. Recommend GI consultation for EGD/EUS consideration. 3. 2.2 cm left adrenal nodule, statistically an adenoma. Attention on follow-up. 4. Extensive colonic diverticulosis. 5. Fibroid uterus. 6. Hepatic steatosis and atherosclerosis. 7. Fatty midline hernia. 8. Cholelithiasis.   Electronically Signed   By: Monte Fantasia M.D.   On: 04/23/2018 05:06  Assessment/Plan:  58 y.o. female with acute over chronic pancreatitis, complicated by  pertinent comorbidities including hypertension, suspected diabetes and chronic pancreatitis. Patient with pancreatitis of unknown etiology. Previous episodes of pancreatitis were suspected to be alcohol induces but patient refers not drinking alcohol since couple of weeks ago. There is a stone on the gallbladder that may be another cause of pancreatitis. The chronic pancreatitis, with the significant calcifications on the head of the pancreas, and the large peripancreatic cystic mass can be another cause of the pancreatitis. At this moment, I agree with current management of bowel rest, IVF and pain management. I agree to start clear liquid diet in the morning if pain continue to improve as patient is doing. I also agree with GI that at this moment the best is to treat the acute pancreatitis first and evaluation of cyst and upper GI as outpatient. Due to the finding of the large cyst, that extend posterior to the esophagus, I do not recommend to proceed with cholecystectomy until the suspected pseudocyst is addressed. Will continue to follow as inpatient and help with recommendations as needed.   Arnold Long, MD

## 2018-04-23 NOTE — ED Notes (Signed)
Report given to Kassie RN

## 2018-04-23 NOTE — Consult Note (Addendum)
Cephas Darby, MD [address]  [place]  [place], [place] [address]  Main: [phone]  Fax: [phone] Pager: [phone]   Consultation  Referring Provider:     No ref. provider found Primary Care Physician:  Patient, No Pcp Per Primary Gastroenterologist: None         Reason for Consultation: Lesion at the esophageal hiatus, evaluate for EGD/EUS,   acute on chronic pancreatitis  Date of Admission:  04/23/2018 Date of Consultation:  04/23/2018         HPI:   Amanda Hahn is a 58 y.o. African-American female with morbid obesity, hypertension, known history of pancreatitis in the past, attributed to alcohol use.  Patient presents to ER yesterday with 1 day history of worsening upper abdominal pain associated with nausea and one episode of nonbloody emesis.  Patient reports having prior history of pancreatitis and she was told that there was a mass that was seen on her CT but she did not follow-up.  She admits to drinking alcohol heavily in the past but has not drank since Christmas.  In the ER, she was found to have elevated lipase 296 and underwent CT scan which revealed 6 cm multiple pockets of rim-enhancing fluid measuring up to 6.2 cm and questionable mass in the lower esophagus. Evidence of chronic pancreatitis with calcification and dilated main duct with peripancreatic edema.  GI is consulted for further evaluation of this possible mass near the lower esophagus.  However, patient denies difficulty swallowing, reflux.  She admits to consuming fatty, greasy foods as she works in [employer].  She states she has been taking some pancreatic enzymes as outpatient.  Today, patient reports that her upper abdominal pain has significantly improved.  She likes to try some liquids.  She reports her pain is mostly in the lower abdomen today.  She denies nausea or vomiting.  She is passing flatus  NSAIDs: None  Antiplts/Anticoagulants/Anti thrombotics: None  GI Procedures:  None  Past Medical History:  Diagnosis Date  . History of pancreatitis   . Hypertension     Past Surgical History:  Procedure Laterality Date  . CESAREAN SECTION     x2    Prior to Admission medications   Not on File    Family History  Problem Relation Age of Onset  . CAD Mother      Social History   Tobacco Use  . Smoking status: Current Every Day Smoker  . Smokeless tobacco: Never Used  Substance Use Topics  . Alcohol use: Yes    Comment: occasional  . Drug use: Not on file    Allergies as of 04/22/2018  . (No Known Allergies)    Review of Systems:    All systems reviewed and negative except where noted in HPI.   Physical Exam:  Vital signs in last 24 hours: Temp:  [97.9 F (36.6 C)-98.3 F (36.8 C)] 98 F (36.7 C) (01/09 1559) Pulse Rate:  [71-90] 77 (01/09 1559) Resp:  [15-20] 18 (01/09 1559) BP: (128-188)/(70-100) 147/83 (01/09 1559) SpO2:  [95 %-100 %] 98 % (01/09 1559) Weight:  [72.6 kg] 72.6 kg (01/08 2212)   General:   Pleasant, cooperative in NAD Head:  Normocephalic and atraumatic. Eyes:   No icterus.   Conjunctiva pink. PERRLA. Ears:  Normal auditory acuity. Neck:  Supple; no masses or thyroidomegaly Lungs: Respirations even and unlabored. Lungs clear to auscultation bilaterally.   No wheezes, crackles, or rhonchi.  Heart:  Regular rate and  rhythm;  Without murmur, clicks, rubs or gallops Abdomen:  Soft, obese abdomen, grossly distended, dull to percussion, mild lower abdominal tenderness, epigastric area nontender. Normal bowel sounds. No appreciable masses or hepatomegaly.  No rebound or guarding.  Rectal:  Not performed. Msk:  Symmetrical without gross deformities.  Strength normal Extremities:  Without edema, cyanosis or clubbing. Neurologic:  Alert and oriented x3;  grossly normal neurologically. Skin:  Intact without significant lesions or rashes. Psych:  Alert and cooperative. Normal affect.  LAB RESULTS: CBC Latest Ref Rng &  Units 04/22/2018 09/03/2010  WBC 4.0 - 10.5 K/uL 10.9(H) 8.6  Hemoglobin 12.0 - 15.0 g/dL 13.5 11.8(L)  Hematocrit 36.0 - 46.0 % 40.0 35.3(L)  Platelets 150 - 400 K/uL 384 378    BMET BMP Latest Ref Rng & Units 04/22/2018 09/03/2010  Glucose 70 - 99 mg/dL 342(H) 100(H)  BUN 6 - 20 mg/dL 14 10  Creatinine 0.44 - 1.00 mg/dL 0.77 0.56  Sodium 135 - 145 mmol/L 137 139  Potassium 3.5 - 5.1 mmol/L 3.3(L) 3.3(L)  Chloride 98 - 111 mmol/L 106 102  CO2 22 - 32 mmol/L 21(L) 27  Calcium 8.9 - 10.3 mg/dL 8.8(L) 10.0    LFT Hepatic Function Latest Ref Rng & Units 04/22/2018  Total Protein 6.5 - 8.1 g/dL 7.9  Albumin 3.5 - 5.0 g/dL 3.9  AST 15 - 41 U/L 22  ALT 0 - 44 U/L 12  Alk Phosphatase 38 - 126 U/L 101  Total Bilirubin 0.3 - 1.2 mg/dL 0.4     STUDIES: Ct Abdomen Pelvis W Contrast  Result Date: 04/23/2018 CLINICAL DATA:  Lower abdominal pain since 4 p.m. One episode of vomiting today. EXAM: CT ABDOMEN AND PELVIS WITH CONTRAST TECHNIQUE: Multidetector CT imaging of the abdomen and pelvis was performed using the standard protocol following bolus administration of intravenous contrast. CONTRAST:  142m ISOVUE-300 IOPAMIDOL (ISOVUE-300) INJECTION 61% COMPARISON:  None. FINDINGS: Lower chest: Mass indistinguishable from the lower esophagus with multiple pockets of rim enhancing fluid. Mass measures up to 6.2 cm with fluid pockets measuring up to 2.3 cm. The regional fat does not appear particularly inflamed. Reportedly the patient is very well appearing on exam and there is no history of chronic dysphagia. Hepatobiliary: Hepatic steatosis.Cholelithiasis with gas containing stone at the fundus. Pancreas: Chronic pancreatitis with areas of calcification and variable main duct dilatation seen at the midline body and tail. No gross mass lesion is seen. There is peripancreatic edema. Spleen: 2 small areas of low-density that are nonspecific and not particularly worrisome. Adrenals/Urinary Tract: 2.2 cm fairly  homogeneous left adrenal nodule. No hydronephrosis or stone. Unremarkable bladder. Stomach/Bowel: No obstruction. No appendicitis. Extensive colonic diverticulosis Vascular/Lymphatic: No acute vascular abnormality. No mass or adenopathy. Reproductive:Fibroid uterus. Other: No ascites or pneumoperitoneum.  Fatty midline hernia Musculoskeletal: No acute abnormalities. These results were called by telephone at the time of interpretation on 04/23/2018 at 5:07 am to Dr. CHinda Kehr, who verbally acknowledged these results. IMPRESSION: 1. Acute on chronic pancreatitis. Areas of main duct dilatation is likely related to the chronic pancreatitis, but consider elective MR follow-up. 2. Multiloculated ~ 6 cm mass in the lower mediastinum extending through the esophageal hiatus. A pseudocyst is favored. Clinical history makes subacute esophageal rupture or abscess unlikely. This would be an unusual appearance for neoplasm. Recommend GI consultation for EGD/EUS consideration. 3. 2.2 cm left adrenal nodule, statistically an adenoma. Attention on follow-up. 4. Extensive colonic diverticulosis. 5. Fibroid uterus. 6. Hepatic steatosis and atherosclerosis. 7. Fatty  midline hernia. 8. Cholelithiasis. Electronically Signed   By: Monte Fantasia M.D.   On: 04/23/2018 05:06      Impression / Plan:   Amanda Hahn is a 58 y.o. African-American female with morbid obesity, hypertension, history of alcohol use, prior history of acute pancreatitis with question pancreatic lesion from prior imaging reported by patient admitted with acute on chronic pancreatitis and CT showing possible lesion in lower esophagus measuring 6 cm in size which is multiloculated with fluid pockets favoring more for pseudocyst of the pancreas given her history rather than esophageal tumor.  Patient denies having any upper GI symptoms including dysphagia, reflux.  Do not recommend upper endoscopic evaluation in setting of acute pancreatitis.  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 -Continue IV fluids -Pain controlled -Can start clear liquids tomorrow if symptoms continue to improve -Check serum triglyceride levels -Check hemoglobin A1c -Advised patient to avoid fatty foods -Maintain abstinence from alcohol  Thank you for involving me in the care of this patient.  Will follow along with you    LOS: 0 days   Sherri Sear, MD  04/23/2018, 5:10 PM   Note: This dictation was prepared with Dragon dictation along with smaller phrase technology. Any transcriptional errors that result from this process are unintentional.

## 2018-04-24 LAB — GLUCOSE, CAPILLARY
Glucose-Capillary: 145 mg/dL — ABNORMAL HIGH (ref 70–99)
Glucose-Capillary: 127 mg/dL — ABNORMAL HIGH (ref 70–99)
Glucose-Capillary: 142 mg/dL — ABNORMAL HIGH (ref 70–99)
Glucose-Capillary: 123 mg/dL — ABNORMAL HIGH (ref 70–99)

## 2018-04-24 LAB — PHOSPHORUS: Phosphorus: 2.6 mg/dL (ref 2.5–4.6)

## 2018-04-24 LAB — BASIC METABOLIC PANEL
ANION GAP: 6 (ref 5–15)
BUN: 10 mg/dL (ref 6–20)
CALCIUM: 7.7 mg/dL — AB (ref 8.9–10.3)
CO2: 23 mmol/L (ref 22–32)
Chloride: 112 mmol/L — ABNORMAL HIGH (ref 98–111)
Creatinine, Ser: 0.66 mg/dL (ref 0.44–1.00)
GFR calc Af Amer: >60 mL/min (ref >60)
GFR calc non Af Amer: >60 mL/min (ref >60)
Glucose, Bld: 135 mg/dL — ABNORMAL HIGH (ref 70–99)
Potassium: 3 mmol/L — ABNORMAL LOW (ref 3.5–5.1)
Sodium: 141 mmol/L (ref 135–145)

## 2018-04-24 LAB — CBC
HCT: 35.7 % — ABNORMAL LOW (ref 36.0–46.0)
HEMOGLOBIN: 11.7 g/dL — AB (ref 12.0–15.0)
MCH: 27.6 pg (ref 26.0–34.0)
MCHC: 32.8 g/dL (ref 30.0–36.0)
MCV: 84.2 fL (ref 80.0–100.0)
PLATELETS: 304 10*3/uL (ref 150–400)
RBC: 4.24 MIL/uL (ref 3.87–5.11)
RDW: 13.9 % (ref 11.5–15.5)
WBC: 11.7 10*3/uL — ABNORMAL HIGH (ref 4.0–10.5)
nRBC: 0 % (ref 0.0–0.2)

## 2018-04-24 LAB — LIPASE, BLOOD: Lipase: 306 U/L — ABNORMAL HIGH (ref 11–51)

## 2018-04-24 LAB — MAGNESIUM: MAGNESIUM: 1.8 mg/dL (ref 1.7–2.4)

## 2018-04-24 LAB — TRIGLYCERIDES: Triglycerides: 105 mg/dL (ref <150)

## 2018-04-24 LAB — HIV ANTIBODY (ROUTINE TESTING W REFLEX): HIV Screen 4th Generation wRfx: NONREACTIVE

## 2018-04-24 MED ORDER — OXYCODONE-ACETAMINOPHEN 5-325 MG PO TABS
1.0000 | ORAL_TABLET | ORAL | Status: DC | PRN
  Administered 2018-04-24 – 2018-04-29 (×12): 1 via ORAL
  Filled 2018-04-24 (×12): qty 1

## 2018-04-24 MED ORDER — POTASSIUM CHLORIDE IN NACL 20-0.9 MEQ/L-% IV SOLN
INTRAVENOUS | Status: DC
  Administered 2018-04-24 – 2018-04-25 (×4): via INTRAVENOUS
  Filled 2018-04-24 (×6): qty 1000

## 2018-04-24 MED ORDER — INSULIN ASPART 100 UNIT/ML ~~LOC~~ SOLN: 0.0000 [IU] | Freq: Every day | SUBCUTANEOUS | Status: DC

## 2018-04-24 MED ORDER — LIVING WELL WITH DIABETES BOOK
Freq: Once | Status: AC
  Administered 2018-04-24: 16:00:00
  Filled 2018-04-24: qty 1

## 2018-04-24 MED ORDER — INSULIN ASPART 100 UNIT/ML ~~LOC~~ SOLN
0.0000 [IU] | Freq: Four times a day (QID) | SUBCUTANEOUS | Status: DC
  Administered 2018-04-24: 1 [IU] via SUBCUTANEOUS
  Filled 2018-04-24: qty 1

## 2018-04-24 MED ORDER — INSULIN ASPART 100 UNIT/ML ~~LOC~~ SOLN
0.0000 [IU] | Freq: Three times a day (TID) | SUBCUTANEOUS | Status: DC
  Administered 2018-04-24: 1 [IU] via SUBCUTANEOUS
  Filled 2018-04-24: qty 1

## 2018-04-24 MED ORDER — POTASSIUM CHLORIDE CRYS ER 20 MEQ PO TBCR
40.0000 meq | EXTENDED_RELEASE_TABLET | Freq: Two times a day (BID) | ORAL | Status: DC
  Administered 2018-04-24: 40 meq via ORAL
  Filled 2018-04-24: qty 2

## 2018-04-24 NOTE — Progress Notes (Signed)
Cephas Darby, MD [address]  [place]  [place], [place] [address]  Main: [phone]  Fax: [phone] Pager: [phone]   Subjective: No acute events overnight, she reports that her abdominal pain was significantly better until she had clear liquids it 1:30 PM that resulted in onset of severe pain and was tearful.  Denies nausea or vomiting.   Objective: Vital signs in last 24 hours: Vitals:   04/24/18 0816 04/24/18 1052 04/24/18 1601 04/24/18 1633  BP: 129/67 (!) 159/93 (!) 148/100 (!) 145/93  Pulse: 88 98 (!) 106 (!) 108  Resp: 18 18 18    Temp: 98.9 F (37.2 C) 98.8 F (37.1 C) 99.7 F (37.6 C) 99.9 F (37.7 C)  TempSrc: Oral Oral Oral Oral  SpO2: 98% 97% 96%   Weight:      Height:       Weight change: 2.722 kg  Intake/Output Summary (Last 24 hours) at 04/24/2018 1811 Last data filed at 04/24/2018 1728 Gross per 24 hour  Intake 3628.98 ml  Output 1675 ml  Net 1953.98 ml     Exam: Heart:: Regular rate and rhythm or S1S2 present Lungs: normal and clear to auscultation Abdomen: soft, nontender, normal bowel sounds   Lab Results: CBC Latest Ref Rng & Units 04/24/2018 04/22/2018 09/03/2010  WBC 4.0 - 10.5 K/uL 11.7(H) 10.9(H) 8.6  Hemoglobin 12.0 - 15.0 g/dL 11.7(L) 13.5 11.8(L)  Hematocrit 36.0 - 46.0 % 35.7(L) 40.0 35.3(L)  Platelets 150 - 400 K/uL 304 384 378   CMP Latest Ref Rng & Units 04/24/2018 04/22/2018 09/03/2010  Glucose 70 - 99 mg/dL 135(H) 342(H) 100(H)  BUN 6 - 20 mg/dL 10 14 10   Creatinine 0.44 - 1.00 mg/dL 0.66 0.77 0.56  Sodium 135 - 145 mmol/L 141 137 139  Potassium 3.5 - 5.1 mmol/L 3.0(L) 3.3(L) 3.3(L)  Chloride 98 - 111 mmol/L 112(H) 106 102  CO2 22 - 32 mmol/L 23 21(L) 27  Calcium 8.9 - 10.3 mg/dL 7.7(L) 8.8(L) 10.0  Total Protein 6.5 - 8.1 g/dL - 7.9 -  Total Bilirubin 0.3 - 1.2 mg/dL - 0.4 -  Alkaline Phos 38 - 126 U/L - 101 -  AST 15 - 41 U/L - 22 -  ALT 0 - 44 U/L - 12 -   Micro Results: No results found for this or  any previous visit (from the past 240 hour(s)). Studies/Results: Ct Abdomen Pelvis W Contrast  Result Date: 04/23/2018 CLINICAL DATA:  Lower abdominal pain since 4 p.m. One episode of vomiting today. EXAM: CT ABDOMEN AND PELVIS WITH CONTRAST TECHNIQUE: Multidetector CT imaging of the abdomen and pelvis was performed using the standard protocol following bolus administration of intravenous contrast. CONTRAST:  178mL ISOVUE-300 IOPAMIDOL (ISOVUE-300) INJECTION 61% COMPARISON:  None. FINDINGS: Lower chest: Mass indistinguishable from the lower esophagus with multiple pockets of rim enhancing fluid. Mass measures up to 6.2 cm with fluid pockets measuring up to 2.3 cm. The regional fat does not appear particularly inflamed. Reportedly the patient is very well appearing on exam and there is no history of chronic dysphagia. Hepatobiliary: Hepatic steatosis.Cholelithiasis with gas containing stone at the fundus. Pancreas: Chronic pancreatitis with areas of calcification and variable main duct dilatation seen at the midline body and tail. No gross mass lesion is seen. There is peripancreatic edema. Spleen: 2 small areas of low-density that are nonspecific and not particularly worrisome. Adrenals/Urinary Tract: 2.2 cm fairly homogeneous left adrenal nodule. No hydronephrosis or stone. Unremarkable bladder. Stomach/Bowel: No obstruction. No appendicitis.  Extensive colonic diverticulosis Vascular/Lymphatic: No acute vascular abnormality. No mass or adenopathy. Reproductive:Fibroid uterus. Other: No ascites or pneumoperitoneum.  Fatty midline hernia Musculoskeletal: No acute abnormalities. These results were called by telephone at the time of interpretation on 04/23/2018 at 5:07 am to Dr. Hinda Kehr , who verbally acknowledged these results. IMPRESSION: 1. Acute on chronic pancreatitis. Areas of main duct dilatation is likely related to the chronic pancreatitis, but consider elective MR follow-up. 2. 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 A pseudocyst is favored. Clinical history makes subacute esophageal rupture or abscess unlikely. This would be an unusual appearance for neoplasm. Recommend GI consultation for EGD/EUS consideration. 3. 2.2 cm left adrenal nodule, statistically an adenoma. Attention on follow-up. 4. Extensive colonic diverticulosis. 5. Fibroid uterus. 6. Hepatic steatosis and atherosclerosis. 7. Fatty midline hernia. 8. Cholelithiasis. Electronically Signed   By: Monte Fantasia M.D.   On: 04/23/2018 05:06   Medications:  I have reviewed the patient's current medications. Prior to Admission:  No medications prior to admission.   Scheduled: . amLODipine  5 mg Oral Daily  . docusate sodium  100 mg Oral BID  . insulin aspart  0-5 Units Subcutaneous QHS  . insulin aspart  0-9 Units Subcutaneous Q6H  . potassium chloride  40 mEq Oral BID   Continuous: . 0.9 % NaCl with KCl 20 mEq / L Stopped (04/24/18 1634)   LMB:[id number] **OR** acetaminophen, hydrALAZINE, HYDROmorphone (DILAUDID) injection, ondansetron **OR** ondansetron (ZOFRAN) IV, oxyCODONE-acetaminophen Scheduled Meds: . amLODipine  5 mg Oral Daily  . docusate sodium  100 mg Oral BID  . insulin aspart  0-5 Units Subcutaneous QHS  . insulin aspart  0-9 Units Subcutaneous Q6H  . potassium chloride  40 mEq Oral BID   Continuous Infusions: . 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 Pain control Increase IV fluids to 125 ml per hour Diabetes education She will probably need insulin regimen given acute on chronic pancreatitis  Dr. Alice Reichert to cover from tomorrow   LOS: 1 day   Lantz Hermann 04/24/2018, 6:11 PM

## 2018-04-24 NOTE — Progress Notes (Addendum)
Inpatient Diabetes Program Recommendations  AACE/ADA: New Consensus Statement on Inpatient Glycemic Control (2015)  Target Ranges:  Prepandial:   less than 140 mg/dL      Peak postprandial:   less than 180 mg/dL (1-2 hours)      Critically ill patients:  140 - 180 mg/dL   Lab Results  Component Value Date   GLUCAP 127 (H) 04/24/2018   HGBA1C 8.8 (H) 04/23/2018    Review of Glycemic Control Results for Amanda Hahn, Amanda Hahn (MRN [id number]) as of 04/24/2018 15:42  Ref. Range 04/23/2018 15:39 04/23/2018 22:29 04/24/2018 04:14 04/24/2018 12:53  Glucose-Capillary Latest Ref Range: 70 - 99 mg/dL 175 (H) 149 (H) 145 (H) 127 (H)   Diabetes history: New diagnosis of DM  Outpatient Diabetes medications: None Current orders for Inpatient glycemic control:  Novolog moderate tid with meals and HS  Inpatient Diabetes Program Recommendations:    Referral received.  A1C=8.8% indicating average blood sugar of 204 mg/dL.  Will order Living Well with DM booklet for patient.  Upon D/c from the hospital, she will likely be able to start on oral DM medications and will likely not need insulin based on A1C and current blood sugars.  Will discuss with RN.   Thanks,  Adah Perl, RN, BC-ADM Inpatient Diabetes Coordinator Pager [phone] (8a-5p)

## 2018-04-24 NOTE — Progress Notes (Signed)
Patient called RN into room and stated she was in extreme pain. Rated pain over a 10 on the 0-10 pain scale. She was sitting on the side of the bed with tears in her eyes. I asked her if she thought the pain was from eating and she said yes. She had clear liquids at 1330. Pt cannot have tylenol until 1630 or dilaudid until 1700 per order. MD (dr. Bridgett Larsson) paged.

## 2018-04-24 NOTE — Progress Notes (Signed)
[facility] Day(s): 1.   Post op day(s):  Marland Kitchen   Interval History: Patient seen and examined, no acute events or new complaints overnight. Patient reports feeling better than yesterday, denies nausea or vomiting.   Vital signs in last 24 hours: [min-max] current  Temp:  [97.9 F (36.6 C)-99.9 F (37.7 C)] 99.9 F (37.7 C) (01/10 1633) Pulse Rate:  [87-108] 108 (01/10 1633) Resp:  [18] 18 (01/10 1601) BP: (111-159)/(59-100) 145/93 (01/10 1633) SpO2:  [95 %-98 %] 96 % (01/10 1601) Weight:  [75.3 kg] 75.3 kg (01/10 0423)     Height: 5\' 2"  (157.5 cm) Weight: 75.3 kg BMI (Calculated): 30.35   Physical Exam:  Constitutional: alert, cooperative and no distress  Gastrointestinal: soft, non-tender, and non-distended  Labs:  CBC Latest Ref Rng & Units 04/24/2018 04/22/2018 09/03/2010  WBC 4.0 - 10.5 K/uL 11.7(H) 10.9(H) 8.6  Hemoglobin 12.0 - 15.0 g/dL 11.7(L) 13.5 11.8(L)  Hematocrit 36.0 - 46.0 % 35.7(L) 40.0 35.3(L)  Platelets 150 - 400 K/uL 304 384 378   CMP Latest Ref Rng & Units 04/24/2018 04/22/2018 09/03/2010  Glucose 70 - 99 mg/dL 135(H) 342(H) 100(H)  BUN 6 - 20 mg/dL 10 14 10   Creatinine 0.44 - 1.00 mg/dL 0.66 0.77 0.56  Sodium 135 - 145 mmol/L 141 137 139  Potassium 3.5 - 5.1 mmol/L 3.0(L) 3.3(L) 3.3(L)  Chloride 98 - 111 mmol/L 112(H) 106 102  CO2 22 - 32 mmol/L 23 21(L) 27  Calcium 8.9 - 10.3 mg/dL 7.7(L) 8.8(L) 10.0  Total Protein 6.5 - 8.1 g/dL - 7.9 -  Total Bilirubin 0.3 - 1.2 mg/dL - 0.4 -  Alkaline Phos 38 - 126 U/L - 101 -  AST 15 - 41 U/L - 22 -  ALT 0 - 44 U/L - 12 -    Imaging studies: No new pertinent imaging studies   Assessment/Plan:  58 y.o. female with acute over chronic pancreatitis, complicated by pertinent comorbidities including hypertension, suspected diabetes and chronic pancreatitis.  No improvement today, agree to take it slowly with diet. Worsening electrolytes. Agree with replacements. Will follow.   Arnold Long, MD

## 2018-04-24 NOTE — Progress Notes (Signed)
[facility] [place] NAME: Amanda Hahn    MR#:  [id number]  DATE OF BIRTH:  03/11/1961  SUBJECTIVE:  CHIEF COMPLAINT:   Chief Complaint  Patient presents with  . Abdominal Pain   Better abdominal pain. REVIEW OF SYSTEMS:  Review of Systems  Constitutional: Negative for chills and fever.  HENT: Negative for hearing loss.   Eyes: Negative for blurred vision and double vision.  Respiratory: Negative for cough and hemoptysis.   Cardiovascular: Negative for chest pain and palpitations.  Gastrointestinal: Positive for abdominal pain. Negative for diarrhea, heartburn, nausea and vomiting.  Genitourinary: Negative for dysuria and urgency.  Musculoskeletal: Negative for myalgias and neck pain.  Skin: Negative for itching and rash.  Neurological: Negative for dizziness and headaches.  Psychiatric/Behavioral: Negative for depression and hallucinations.    DRUG ALLERGIES:  No Known Allergies VITALS:  Blood pressure (!) 159/93, pulse 98, temperature 98.8 F (37.1 C), temperature source Oral, resp. rate 18, height 5\' 2"  (1.575 m), weight 75.3 kg, SpO2 97 %. PHYSICAL EXAMINATION:   Physical Exam  Constitutional: She is oriented to person, place, and time. She appears well-developed and well-nourished.  HENT:  Head: Normocephalic and atraumatic.  Eyes: Pupils are equal, round, and reactive to light. Conjunctivae and EOM are normal.  Neck: Normal range of motion. Neck supple.  Cardiovascular: Normal rate and regular rhythm.  Pulmonary/Chest: Effort normal and breath sounds normal.  Abdominal: Soft. Bowel sounds are normal. There is abdominal tenderness. There is no rebound and no guarding.  Musculoskeletal: Normal range of motion.        General: No edema.  Neurological: She is alert and oriented to person, place, and time.  Skin: Skin is warm.  Psychiatric: She has a normal mood and affect. Her behavior is normal.   LABORATORY PANEL:    Female CBC Recent Labs  Lab 04/24/18 0600  WBC 11.7*  HGB 11.7*  HCT 35.7*  PLT 304   ------------------------------------------------------------------------------------------------------------------ Chemistries  Recent Labs  Lab 04/22/18 2216 04/24/18 0600  NA 137 141  K 3.3* 3.0*  CL 106 112*  CO2 21* 23  GLUCOSE 342* 135*  BUN 14 10  CREATININE 0.77 0.66  CALCIUM 8.8* 7.7*  MG  --  1.8  AST 22  --   ALT 12  --   ALKPHOS 101  --   BILITOT 0.4  --    RADIOLOGY:  No results found. ASSESSMENT AND PLAN:   This is a 58 year old female admitted for pancreatitis.  1.    Acute on chronic pancreatitis: Elevated lipase and abdominal pain.   Denies any nausea vomiting at this time.   Started clear liquid diet and continue IV fluid support. Noted evidence of cholelithiasis with gas containing stone at the fundus on CAT scan.   Dr. Windell Moment does not recommend to proceed with cholecystectomy until the suspected pseudocyst is addressed.  The patient complains of abdominal pain after clear liquid diet. Discontinued clear liquid diet, n.p.o. with IV fluid support.  2. 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 A pseudocyst is favored. Clinical history makes subacute esophageal rupture or abscess unlikely. This would be an unusual appearance for neoplasm. Dr. Marius Ditch does not recommend upper endoscopic evaluation in setting of acute pancreatitis. She would wait until her acute pancreatitis resolves and follow-up as outpatient, perform EGD and repeat imaging at that time.  3.  Hypertension: Continue Norvasc.  Placed on as needed hydralazine with parameters for systolic blood pressure greater than 160  4.    Diabetes with glucosuria: The patient has more than 500 mg/dL glucose in her urine.  Hemoglobin 8.8.  Diabetes is possible related to pancreatitis and pancreatic cyst. Sliding scale AC at  bedtime.  Hypokalemia.  Potassium supplement.  Magnesium is normal.  I discussed with Dr. Windell Moment. All the records are reviewed and case discussed with Care Management/Social Worker. Management plans discussed with the patient, family and they are in agreement.  CODE STATUS: Full Code  TOTAL TIME TAKING CARE OF THIS PATIENT: 28 minutes.   More than 50% of the time was spent in counseling/coordination of care: YES  POSSIBLE D/C IN 3 DAYS, DEPENDING ON CLINICAL CONDITION.   Demetrios Loll M.D on 04/24/2018 at 3:20 PM  Between 7am to 6pm - Pager - [phone]  After 6pm go to [email or web address] - Proofreader  [facility]  Office  [phone]  CC: Primary care physician; Patient, No Pcp Per  Note: This dictation was prepared with Dragon dictation along with smaller phrase technology. Any transcriptional errors that result from this process are unintentional.

## 2018-04-25 ENCOUNTER — Inpatient Hospital Stay: Payer: Self-pay

## 2018-04-25 LAB — CBC
HCT: 35.5 % — ABNORMAL LOW (ref 36.0–46.0)
Hemoglobin: 12.1 g/dL (ref 12.0–15.0)
MCH: 27.9 pg (ref 26.0–34.0)
MCHC: 34.1 g/dL (ref 30.0–36.0)
MCV: 82 fL (ref 80.0–100.0)
Platelets: 295 10*3/uL (ref 150–400)
RBC: 4.33 MIL/uL (ref 3.87–5.11)
RDW: 14.1 % (ref 11.5–15.5)
WBC: 18.1 10*3/uL — ABNORMAL HIGH (ref 4.0–10.5)
nRBC: 0 % (ref 0.0–0.2)

## 2018-04-25 LAB — BASIC METABOLIC PANEL
Anion gap: 10 (ref 5–15)
BUN: 7 mg/dL (ref 6–20)
CO2: 19 mmol/L — ABNORMAL LOW (ref 22–32)
Calcium: 7.9 mg/dL — ABNORMAL LOW (ref 8.9–10.3)
Chloride: 111 mmol/L (ref 98–111)
Creatinine, Ser: 0.63 mg/dL (ref 0.44–1.00)
GFR calc Af Amer: >60 mL/min (ref >60)
GFR calc non Af Amer: >60 mL/min (ref >60)
Glucose, Bld: 120 mg/dL — ABNORMAL HIGH (ref 70–99)
Potassium: 3.4 mmol/L — ABNORMAL LOW (ref 3.5–5.1)
Sodium: 140 mmol/L (ref 135–145)

## 2018-04-25 LAB — LIPASE, BLOOD: Lipase: 68 U/L — ABNORMAL HIGH (ref 11–51)

## 2018-04-25 LAB — GLUCOSE, CAPILLARY
Glucose-Capillary: 108 mg/dL — ABNORMAL HIGH (ref 70–99)
Glucose-Capillary: 121 mg/dL — ABNORMAL HIGH (ref 70–99)
Glucose-Capillary: 158 mg/dL — ABNORMAL HIGH (ref 70–99)

## 2018-04-25 MED ORDER — POTASSIUM CHLORIDE CRYS ER 20 MEQ PO TBCR
40.0000 meq | EXTENDED_RELEASE_TABLET | Freq: Once | ORAL | Status: AC
  Administered 2018-04-25: 40 meq via ORAL
  Filled 2018-04-25: qty 2

## 2018-04-25 NOTE — Progress Notes (Signed)
   04/25/18 1635  Vital Signs  BP (!) 150/89  BP Location Left Arm  Patient Position (if appropriate) Sitting  BP Method Automatic  Pulse Rate 100  Pulse Rate Source Dinamap  Resp 20  Temp 100.1 F (37.8 C)  Temp Source Oral  Oxygen Therapy  SpO2 98 %  Dr. Bridgett Larsson notified pt has spiked fever this afternoon, tachycardic throughout the day, and increased SOB. See new orders.

## 2018-04-25 NOTE — Progress Notes (Signed)
Spoke with Dr. Bridgett Larsson regarding elevated WBC, continued abdominal pain, and GI recommendation (Dr. Alice Reichert) to resume STRICT NPO status and not advance without speaking with GI service. Pt updated and will continue to monitor.

## 2018-04-25 NOTE — Progress Notes (Signed)
[place] Hospital Day(s): 2.   Post op day(s):  Marland Kitchen   Interval History: Patient seen and examined, no acute events or new complaints overnight. Patient reports pain improved again. Refers mild lower abdomen pain, denies nausea.  Vital signs in last 24 hours: [min-max] current  Temp:  [98.8 F (37.1 C)-99.9 F (37.7 C)] 99.8 F (37.7 C) (01/11 0836) Pulse Rate:  [96-114] 96 (01/11 0836) Resp:  [18-20] 18 (01/11 0836) BP: (125-159)/(81-101) 135/85 (01/11 0836) SpO2:  [95 %-100 %] 96 % (01/11 0836) Weight:  [76.7 kg] 76.7 kg (01/11 0500)     Height: 5\' 2"  (157.5 cm) Weight: 76.7 kg BMI (Calculated): 30.9    Physical Exam:  Constitutional: alert, cooperative and no distress  Respiratory: breathing non-labored at rest  Cardiovascular: regular rate and sinus rhythm  Gastrointestinal: soft, non-tender, and non-distended  Labs:  CBC Latest Ref Rng & Units 04/24/2018 04/22/2018 09/03/2010  WBC 4.0 - 10.5 K/uL 11.7(H) 10.9(H) 8.6  Hemoglobin 12.0 - 15.0 g/dL 11.7(L) 13.5 11.8(L)  Hematocrit 36.0 - 46.0 % 35.7(L) 40.0 35.3(L)  Platelets 150 - 400 K/uL 304 384 378   CMP Latest Ref Rng & Units 04/25/2018 04/24/2018 04/22/2018  Glucose 70 - 99 mg/dL 120(H) 135(H) 342(H)  BUN 6 - 20 mg/dL 7 10 14   Creatinine 0.44 - 1.00 mg/dL 0.63 0.66 0.77  Sodium 135 - 145 mmol/L 140 141 137  Potassium 3.5 - 5.1 mmol/L 3.4(L) 3.0(L) 3.3(L)  Chloride 98 - 111 mmol/L 111 112(H) 106  CO2 22 - 32 mmol/L 19(L) 23 21(L)  Calcium 8.9 - 10.3 mg/dL 7.9(L) 7.7(L) 8.8(L)  Total Protein 6.5 - 8.1 g/dL - - 7.9  Total Bilirubin 0.3 - 1.2 mg/dL - - 0.4  Alkaline Phos 38 - 126 U/L - - 101  AST 15 - 41 U/L - - 22  ALT 0 - 44 U/L - - 12   Lipase     Component Value Date/Time   LIPASE 68 (H) 04/25/2018 0618    Imaging studies: No new pertinent imaging studies   Assessment/Plan:  58 y.o.femalewith acute over chronic pancreatitis, complicated by pertinent comorbidities includinghypertension,  suspected diabetes and chronic pancreatitis.  Patient with improved pain again. Patient is afraid of eating. From my standpoint there is no contraindication to restart clear liquid. Lipase much improved today. Again, the etiology of the pancreatitis is unknown, (gallstone, chronic pancreatitis with calcified pancreas). I agree with GI to treat the acute exacerbation to then address the large pseudocyst. I do not recommend surgical management (cholecystectomy) in this admission until pancreatic cyst addressed.   Arnold Long, MD

## 2018-04-25 NOTE — Progress Notes (Signed)
Bloods  Sugars continue to be controlled.  Called patient by phone to discuss new diagnosis of DM and elevated A1C.  We discussed normal blood sugar values, monitoring, and that pancreatitis can sometimes be associated with elevated blood sugars.  She was given Living Well with DM booklet.  Discussed that she will need to check her blood sugars at home (will have RN review proper CBG testing with patient).  She does not have a PCP locally and will place case manager referral.  Will have DM coordinator f/u with patient on 1/13.  Unsure of medication needs at d/c?? Will follow.  Adah Perl, RN, BC-ADM Inpatient Diabetes Coordinator Pager [phone] (8a-5p)

## 2018-04-25 NOTE — Progress Notes (Signed)
[facility] Inpatient Progress Note  Subjective: Patient seen for f/u pancreatitis. Appears to have failed po challenge again today and is being evaluated for low grade fever (100.2) with blood cultures. Patient saying she is not feeling hungry at this time. Still has high need for IV dilaudid and po percocet.  Reports passing flatus today and having one BM yesterday.  Objective: Vital signs in last 24 hours: Temp:  [98.9 F (37.2 C)-100.2 F (37.9 C)] 100.1 F (37.8 C) (01/11 1635) Pulse Rate:  [96-114] 100 (01/11 1635) Resp:  [18-20] 20 (01/11 1635) BP: (125-153)/(77-101) 150/89 (01/11 1635) SpO2:  [95 %-100 %] 98 % (01/11 1635) Weight:  [76.7 kg] 76.7 kg (01/11 0500) Blood pressure (!) 150/89, pulse 100, temperature 100.1 F (37.8 C), temperature source Oral, resp. rate 20, height 5\' 2"  (1.575 m), weight 76.7 kg, SpO2 98 %.    Intake/Output from previous day: 01/10 0701 - 01/11 0700 In: 2883.3 [P.O.:360; I.V.:2523.3] Out: 925 [Urine:925]  Intake/Output this shift: Total I/O In: 616.5 [P.O.:120; I.V.:496.5] Out: 800 [Urine:800]   General appearance:  Alert, NAD. Resp: CTA, no wheezes Cardio:  RRR GI: soft, moderately tender in epigastrium with minimal guarding, no rebound. BS hypoactive. Extremities:  NO edema.    Lab Results: Results for orders placed or performed during the hospital encounter of 04/23/18 (from the past 24 hour(s))  Glucose, capillary     Status: Abnormal   Collection Time: 04/24/18  6:06 PM  Result Value Ref Range   Glucose-Capillary 142 (H) 70 - 99 mg/dL  Glucose, capillary     Status: Abnormal   Collection Time: 04/24/18  8:58 PM  Result Value Ref Range   Glucose-Capillary 123 (H) 70 - 99 mg/dL  Lipase, blood     Status: Abnormal   Collection Time: 04/25/18  6:18 AM  Result Value Ref Range   Lipase 68 (H) 11 - 51 U/L  Basic metabolic panel     Status: Abnormal   Collection Time: 04/25/18  6:18 AM  Result Value Ref Range    Sodium 140 135 - 145 mmol/L   Potassium 3.4 (L) 3.5 - 5.1 mmol/L   Chloride 111 98 - 111 mmol/L   CO2 19 (L) 22 - 32 mmol/L   Glucose, Bld 120 (H) 70 - 99 mg/dL   BUN 7 6 - 20 mg/dL   Creatinine, Ser 0.63 0.44 - 1.00 mg/dL   Calcium 7.9 (L) 8.9 - 10.3 mg/dL   GFR calc non Af Amer >60 >60 mL/min   GFR calc Af Amer >60 >60 mL/min   Anion gap 10 5 - 15  Glucose, capillary     Status: Abnormal   Collection Time: 04/25/18  8:35 AM  Result Value Ref Range   Glucose-Capillary 108 (H) 70 - 99 mg/dL  Glucose, capillary     Status: Abnormal   Collection Time: 04/25/18 11:31 AM  Result Value Ref Range   Glucose-Capillary 121 (H) 70 - 99 mg/dL  Glucose, capillary     Status: Abnormal   Collection Time: 04/25/18  4:33 PM  Result Value Ref Range   Glucose-Capillary 158 (H) 70 - 99 mg/dL   Comment 1 Notify RN   CBC     Status: Abnormal   Collection Time: 04/25/18  5:17 PM  Result Value Ref Range   WBC 18.1 (H) 4.0 - 10.5 K/uL   RBC 4.33 3.87 - 5.11 MIL/uL   Hemoglobin 12.1 12.0 - 15.0 g/dL   HCT 35.5 (L) 36.0 -  46.0 %   MCV 82.0 80.0 - 100.0 fL   MCH 27.9 26.0 - 34.0 pg   MCHC 34.1 30.0 - 36.0 g/dL   RDW 14.1 11.5 - 15.5 %   Platelets 295 150 - 400 K/uL   nRBC 0.0 0.0 - 0.2 %     Recent Labs    04/22/18 2216 04/24/18 0600 04/25/18 1717  WBC 10.9* 11.7* 18.1*  HGB 13.5 11.7* 12.1  HCT 40.0 35.7* 35.5*  PLT 384 304 295   BMET Recent Labs    04/22/18 2216 04/24/18 0600 04/25/18 0618  NA 137 141 140  K 3.3* 3.0* 3.4*  CL 106 112* 111  CO2 21* 23 19*  GLUCOSE 342* 135* 120*  BUN 14 10 7   CREATININE 0.77 0.66 0.63  CALCIUM 8.8* 7.7* 7.9*   LFT Recent Labs    04/22/18 2216  PROT 7.9  ALBUMIN 3.9  AST 22  ALT 12  ALKPHOS 101  BILITOT 0.4   PT/INR No results for input(s): LABPROT, INR in the last 72 hours. Hepatitis Panel No results for input(s): HEPBSAG, HCVAB, HEPAIGM, HEPBIGM in the last 72 hours. C-Diff No results for input(s): CDIFFTOX in the last 72  hours. No results for input(s): CDIFFPCR in the last 72 hours.   Studies/Results: Dg Chest 1 View  Result Date: 04/25/2018 CLINICAL DATA:  Shortness of breath. EXAM: CHEST  1 VIEW COMPARISON:  Chest x-ray dated Sep 03, 2010. FINDINGS: The heart size and mediastinal contours are within normal limits. Normal pulmonary vascularity. Low lung volumes with mild bibasilar atelectasis. Blunting of the left costophrenic angle. No consolidation or pneumothorax. No acute osseous abnormality. IMPRESSION: 1. Low lung volumes with mild bibasilar atelectasis. 2. Small left pleural effusion. Electronically Signed   By: Titus Dubin M.D.   On: 04/25/2018 17:12    Scheduled Inpatient Medications:   . amLODipine  5 mg Oral Daily  . docusate sodium  100 mg Oral BID  . insulin aspart  0-5 Units Subcutaneous QHS    Continuous Inpatient Infusions:    PRN Inpatient Medications:  acetaminophen **OR** acetaminophen, hydrALAZINE, HYDROmorphone (DILAUDID) injection, ondansetron **OR** ondansetron (ZOFRAN) IV, oxyCODONE-acetaminophen   Assessment:  1. Acute on chronic pancreatitis. NOT fully resolved.  2. Low grade fever.  3. Pancreatic pseudocyst. 4. Leukocytosis - suggests unresolved pancreatitis.  Plan:  1. Resume NPO status. DO not re-feed without calling GI service, please. 2. Continue other orders, following.   Timohty Renbarger K. Alice Reichert, M.D. 04/25/2018, 5:31 PM

## 2018-04-25 NOTE — Progress Notes (Signed)
[place] at [place] NAME: Amanda Hahn    MR#:  [id number]  DATE OF BIRTH:  Nov 11, 1960  SUBJECTIVE:  CHIEF COMPLAINT:   Chief Complaint  Patient presents with  . Abdominal Pain   Better abdominal pain.  Started clear liquid diet per Psychologist, sport and exercise. REVIEW OF SYSTEMS:  Review of Systems  Constitutional: Negative for chills and fever.  HENT: Negative for hearing loss.   Eyes: Negative for blurred vision and double vision.  Respiratory: Negative for cough and hemoptysis.   Cardiovascular: Negative for chest pain and palpitations.  Gastrointestinal: Positive for abdominal pain. Negative for diarrhea, heartburn, nausea and vomiting.  Genitourinary: Negative for dysuria and urgency.  Musculoskeletal: Negative for myalgias and neck pain.  Skin: Negative for itching and rash.  Neurological: Negative for dizziness and headaches.  Psychiatric/Behavioral: Negative for depression and hallucinations.    DRUG ALLERGIES:  No Known Allergies VITALS:  Blood pressure 131/77, pulse (!) 106, temperature 100.1 F (37.8 C), temperature source Oral, resp. rate 18, height 5\' 2"  (1.575 m), weight 76.7 kg, SpO2 96 %. PHYSICAL EXAMINATION:   Physical Exam  Constitutional: She is oriented to person, place, and time. She appears well-developed and well-nourished.  HENT:  Head: Normocephalic and atraumatic.  Eyes: Pupils are equal, round, and reactive to light. Conjunctivae and EOM are normal.  Neck: Normal range of motion. Neck supple.  Cardiovascular: Normal rate and regular rhythm.  Pulmonary/Chest: Effort normal and breath sounds normal.  Abdominal: Soft. Bowel sounds are normal. There is abdominal tenderness. There is no rebound and no guarding.  Musculoskeletal: Normal range of motion.        General: No edema.  Neurological: She is alert and oriented to person, place, and time.  Skin: Skin is warm.  Psychiatric: She has a normal mood and affect. Her  behavior is normal.   LABORATORY PANEL:  Female CBC Recent Labs  Lab 04/24/18 0600  WBC 11.7*  HGB 11.7*  HCT 35.7*  PLT 304   ------------------------------------------------------------------------------------------------------------------ Chemistries  Recent Labs  Lab 04/22/18 2216 04/24/18 0600 04/25/18 0618  NA 137 141 140  K 3.3* 3.0* 3.4*  CL 106 112* 111  CO2 21* 23 19*  GLUCOSE 342* 135* 120*  BUN 14 10 7   CREATININE 0.77 0.66 0.63  CALCIUM 8.8* 7.7* 7.9*  MG  --  1.8  --   AST 22  --   --   ALT 12  --   --   ALKPHOS 101  --   --   BILITOT 0.4  --   --    RADIOLOGY:  No results found. ASSESSMENT AND PLAN:   This is a 58 year old female admitted for pancreatitis.  1.    Acute on chronic pancreatitis: Elevated lipase and abdominal pain.   Denies any nausea vomiting at this time.   Started clear liquid diet and continue IV fluid support. Noted evidence of cholelithiasis with gas containing stone at the fundus on CAT scan.   Dr. Windell Moment does not recommend to proceed with cholecystectomy until the suspected pseudocyst is addressed.  The patient complains of abdominal pain after clear liquid diet. Discontinued clear liquid diet, n.p.o. with IV fluid support. Lipase decreased to 68. Try clear liquid diet per Dr. Dr. Windell Moment.  2. 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 A pseudocyst is favored. Clinical history makes subacute esophageal rupture or abscess unlikely. This would be an  unusual appearance for neoplasm. Dr. Marius Ditch does not recommend upper endoscopic evaluation in setting of acute pancreatitis. She would wait until her acute pancreatitis resolves and follow-up as outpatient, perform EGD and repeat imaging at that time.  3.  Hypertension: Continue Norvasc.  Placed on as needed hydralazine with parameters for systolic blood pressure greater than 160  4.    Diabetes with  glucosuria: The patient has more than 500 mg/dL glucose in her urine.  Hemoglobin 8.8.  Diabetes is possible related to pancreatitis and pancreatic cyst. Sliding scale ACHS.  Hypokalemia.  Potassium supplement.  Magnesium is normal.  I discussed with Dr. Alice Reichert. All the records are reviewed and case discussed with Care Management/Social Worker. Management plans discussed with the patient, family and they are in agreement.  CODE STATUS: Full Code  TOTAL TIME TAKING CARE OF THIS PATIENT: 28 minutes.   More than 50% of the time was spent in counseling/coordination of care: YES  POSSIBLE D/C IN 3 DAYS, DEPENDING ON CLINICAL CONDITION.   Demetrios Loll M.D on 04/25/2018 at 3:28 PM  Between 7am to 6pm - Pager - [phone]  After 6pm go to [email or web address] - Proofreader  [facility]  Office  [phone]  CC: Primary care physician; Patient, No Pcp Per  Note: This dictation was prepared with Dragon dictation along with smaller phrase technology. Any transcriptional errors that result from this process are unintentional.

## 2018-04-26 LAB — GLUCOSE, CAPILLARY
Glucose-Capillary: 104 mg/dL — ABNORMAL HIGH (ref 70–99)
Glucose-Capillary: 104 mg/dL — ABNORMAL HIGH (ref 70–99)
Glucose-Capillary: 118 mg/dL — ABNORMAL HIGH (ref 70–99)
Glucose-Capillary: 133 mg/dL — ABNORMAL HIGH (ref 70–99)

## 2018-04-26 MED ORDER — METOPROLOL TARTRATE 5 MG/5ML IV SOLN
5.0000 mg | Freq: Two times a day (BID) | INTRAVENOUS | Status: DC
  Administered 2018-04-26 – 2018-04-27 (×3): 5 mg via INTRAVENOUS
  Filled 2018-04-26 (×4): qty 5

## 2018-04-26 MED ORDER — POTASSIUM CHLORIDE IN NACL 20-0.9 MEQ/L-% IV SOLN
INTRAVENOUS | Status: DC
  Administered 2018-04-26 – 2018-04-29 (×6): via INTRAVENOUS
  Filled 2018-04-26 (×7): qty 1000

## 2018-04-26 MED ORDER — INSULIN ASPART 100 UNIT/ML ~~LOC~~ SOLN
0.0000 [IU] | Freq: Four times a day (QID) | SUBCUTANEOUS | Status: DC
  Administered 2018-04-27 – 2018-04-29 (×8): 1 [IU] via SUBCUTANEOUS
  Filled 2018-04-26 (×8): qty 1

## 2018-04-26 MED ORDER — KETOROLAC TROMETHAMINE 30 MG/ML IJ SOLN
30.0000 mg | Freq: Four times a day (QID) | INTRAMUSCULAR | Status: DC | PRN
  Administered 2018-04-26 – 2018-04-29 (×10): 30 mg via INTRAVENOUS
  Filled 2018-04-26 (×10): qty 1

## 2018-04-26 MED ORDER — HYDROMORPHONE HCL 1 MG/ML IJ SOLN
1.0000 mg | INTRAMUSCULAR | Status: DC | PRN
  Administered 2018-04-26 – 2018-04-29 (×19): 1 mg via INTRAVENOUS
  Filled 2018-04-26 (×19): qty 1

## 2018-04-26 NOTE — Progress Notes (Signed)
[place] Hospital Day(s): 3.   Post op day(s):  Marland Kitchen   Interval History: Patient seen and examined, no acute events or new complaints overnight. Patient reports pain comes and goes. Had pain this morning but resolved and now has no pain.    Vital signs in last 24 hours: [min-max] current  Temp:  [98.4 F (36.9 C)-100.2 F (37.9 C)] 98.4 F (36.9 C) (01/12 0817) Pulse Rate:  [97-106] 97 (01/12 0817) Resp:  [18-20] 20 (01/12 0817) BP: (128-152)/(77-94) 152/87 (01/12 0817) SpO2:  [93 %-98 %] 98 % (01/12 0817) Weight:  [76.1 kg] 76.1 kg (01/12 0501)     Height: 5\' 2"  (157.5 cm) Weight: 76.1 kg BMI (Calculated): 30.68   Physical Exam:  Constitutional: alert, cooperative and no distress  Respiratory: breathing non-labored at rest  Cardiovascular: regular rate and sinus rhythm  Gastrointestinal: soft, non-tender, and non-distended  Labs:  CBC Latest Ref Rng & Units 04/25/2018 04/24/2018 04/22/2018  WBC 4.0 - 10.5 K/uL 18.1(H) 11.7(H) 10.9(H)  Hemoglobin 12.0 - 15.0 g/dL 12.1 11.7(L) 13.5  Hematocrit 36.0 - 46.0 % 35.5(L) 35.7(L) 40.0  Platelets 150 - 400 K/uL 295 304 384   CMP Latest Ref Rng & Units 04/25/2018 04/24/2018 04/22/2018  Glucose 70 - 99 mg/dL 120(H) 135(H) 342(H)  BUN 6 - 20 mg/dL 7 10 14   Creatinine 0.44 - 1.00 mg/dL 0.63 0.66 0.77  Sodium 135 - 145 mmol/L 140 141 137  Potassium 3.5 - 5.1 mmol/L 3.4(L) 3.0(L) 3.3(L)  Chloride 98 - 111 mmol/L 111 112(H) 106  CO2 22 - 32 mmol/L 19(L) 23 21(L)  Calcium 8.9 - 10.3 mg/dL 7.9(L) 7.7(L) 8.8(L)  Total Protein 6.5 - 8.1 g/dL - - 7.9  Total Bilirubin 0.3 - 1.2 mg/dL - - 0.4  Alkaline Phos 38 - 126 U/L - - 101  AST 15 - 41 U/L - - 22  ALT 0 - 44 U/L - - 12   Lipase     Component Value Date/Time   LIPASE 68 (H) 04/25/2018 0618    Imaging studies: No new pertinent imaging studies   Assessment/Plan:  58 y.o.femalewith acute over chronic pancreatitis, complicated by pertinent comorbidities  includinghypertension, suspected diabetes and chronic pancreatitis.  Patient with slow resolving pancreatitis. Agree with GI that yesterday increase in WBC count and recurrent pain suggest not resolving pancreatitis. Continue medical management. No indication for surgery at this moment. Will follow along.   Arnold Long, MD

## 2018-04-26 NOTE — Progress Notes (Signed)
[place] at [place] NAME: Amanda Hahn    MR#:  [id number]  DATE OF BIRTH:  [birth year]  SUBJECTIVE:  CHIEF COMPLAINT:   Chief Complaint  Patient presents with  . Abdominal Pain   Patient developed severe abdominal pain after clear liquid diet.  She is on n.p.o. REVIEW OF SYSTEMS:  Review of Systems  Constitutional: Negative for chills and fever.  HENT: Negative for hearing loss.   Eyes: Negative for blurred vision and double vision.  Respiratory: Negative for cough and hemoptysis.   Cardiovascular: Negative for chest pain and palpitations.  Gastrointestinal: Positive for abdominal pain. Negative for diarrhea, heartburn, nausea and vomiting.  Genitourinary: Negative for dysuria and urgency.  Musculoskeletal: Negative for myalgias and neck pain.  Skin: Negative for itching and rash.  Neurological: Negative for dizziness and headaches.  Psychiatric/Behavioral: Negative for depression and hallucinations.    DRUG ALLERGIES:  No Known Allergies VITALS:  Blood pressure 128/66, pulse 100, temperature 98.8 F (37.1 C), temperature source Oral, resp. rate 20, height 5\' 2"  (1.575 m), weight 76.1 kg, SpO2 99 %. PHYSICAL EXAMINATION:   Physical Exam  Constitutional: She is oriented to person, place, and time. She appears well-developed and well-nourished.  HENT:  Head: Normocephalic and atraumatic.  Eyes: Pupils are equal, round, and reactive to light. Conjunctivae and EOM are normal.  Neck: Normal range of motion. Neck supple.  Cardiovascular: Normal rate and regular rhythm.  Pulmonary/Chest: Effort normal and breath sounds normal.  Abdominal: Soft. Bowel sounds are normal. There is abdominal tenderness. There is no rebound and no guarding.  Musculoskeletal: Normal range of motion.        General: No edema.  Neurological: She is alert and oriented to person, place, and time.  Skin: Skin is warm.  Psychiatric: She has a normal mood  and affect. Her behavior is normal.   LABORATORY PANEL:  Female CBC Recent Labs  Lab 04/25/18 1717  WBC 18.1*  HGB 12.1  HCT 35.5*  PLT 295   ------------------------------------------------------------------------------------------------------------------ Chemistries  Recent Labs  Lab 04/22/18 2216 04/24/18 0600 04/25/18 0618  NA 137 141 140  K 3.3* 3.0* 3.4*  CL 106 112* 111  CO2 21* 23 19*  GLUCOSE 342* 135* 120*  BUN 14 10 7   CREATININE 0.77 0.66 0.63  CALCIUM 8.8* 7.7* 7.9*  MG  --  1.8  --   AST 22  --   --   ALT 12  --   --   ALKPHOS 101  --   --   BILITOT 0.4  --   --    RADIOLOGY:  Dg Chest 1 View  Result Date: 04/25/2018 CLINICAL DATA:  Shortness of breath. EXAM: CHEST  1 VIEW COMPARISON:  Chest x-ray dated Sep 03, 2010. FINDINGS: The heart size and mediastinal contours are within normal limits. Normal pulmonary vascularity. Low lung volumes with mild bibasilar atelectasis. Blunting of the left costophrenic angle. No consolidation or pneumothorax. No acute osseous abnormality. IMPRESSION: 1. Low lung volumes with mild bibasilar atelectasis. 2. Small left pleural effusion. Electronically Signed   By: Titus Dubin M.D.   On: 04/25/2018 17:12   ASSESSMENT AND PLAN:   This is a 58 year old female admitted for pancreatitis.  1.    Acute on chronic pancreatitis: Elevated lipase and abdominal pain.   Denies any nausea vomiting at this time.   Started clear liquid diet and continue IV fluid support. Noted evidence of cholelithiasis with gas containing stone  at the fundus on CAT scan.   Dr. Windell Moment does not recommend to proceed with cholecystectomy until the suspected pseudocyst is addressed.  The patient complains of abdominal pain after clear liquid diet. Discontinued clear liquid diet, n.p.o. with IV fluid support. Lipase decreased to 68. Strict n.p.o. per Dr. Alice Reichert.  2. Multiloculated ~ 6 cm mass in the lower mediastinum extending through the  esophageal hiatus noted on CT scan. A pseudocyst is favored. Clinical history makes subacute esophageal rupture or abscess unlikely. This would be an unusual appearance for neoplasm. Dr. Marius Ditch does not recommend upper endoscopic evaluation in setting of acute pancreatitis. She would wait until her acute pancreatitis resolves and follow-up as outpatient, perform EGD and repeat imaging at that time.  3.  Hypertension: Unable to take p.o. Norvasc due to n.p.o.  Start Lopressor 25 mg IV every 12 hours.  4.    Diabetes with glucosuria: The patient has more than 500 mg/dL glucose in her urine.  Hemoglobin 8.8.  Diabetes is possible related to pancreatitis and pancreatic cyst. Sliding scale.  Hypokalemia.  Potassium supplement.  Magnesium is normal.  I discussed with Dr. Alice Reichert. All the records are reviewed and case discussed with Care Management/Social Worker. Management plans discussed with the patient, family and they are in agreement.  CODE STATUS: Full Code  TOTAL TIME TAKING CARE OF THIS PATIENT: 27 minutes.   More than 50% of the time was spent in counseling/coordination of care: YES  POSSIBLE D/C IN 3 DAYS, DEPENDING ON CLINICAL CONDITION.   Demetrios Loll M.D on 04/26/2018 at 1:35 PM  Between 7am to 6pm - Pager - [phone]  After 6pm go to [email or web address] - Proofreader  [facility]  Office  [phone]  CC: Primary care physician; Patient, No Pcp Per  Note: This dictation was prepared with Dragon dictation along with smaller phrase technology. Any transcriptional errors that result from this process are unintentional.

## 2018-04-26 NOTE — Progress Notes (Signed)
Pt complains of severe abdominal pain thisAM,nausea. Noted with dry heaving,nothing produced. PRN zofran given and 1mg  IV dilaudid. Pt reports relief initially, but says her pain meds wear off quickly. Asks for additional pain medication or more frequent dose. Will discuss with MD at rounds.

## 2018-04-26 NOTE — Progress Notes (Signed)
[facility] Gastroenterology Inpatient Progress Note  Subjective: Patient seen for f/u pancreatitis. Patient with c/o increased abdominal pain, incr WBC to 18k. Now strict NPO.   Objective: Vital signs in last 24 hours: Temp:  [98.4 F (36.9 C)-100.2 F (37.9 C)] 98.4 F (36.9 C) (01/12 0817) Pulse Rate:  [97-106] 97 (01/12 0817) Resp:  [18-20] 20 (01/12 0817) BP: (128-152)/(77-94) 152/87 (01/12 0817) SpO2:  [93 %-98 %] 98 % (01/12 0817) Weight:  [76.1 kg] 76.1 kg (01/12 0501) Blood pressure (!) 152/87, pulse 97, temperature 98.4 F (36.9 C), temperature source Oral, resp. rate 20, height 5\' 2"  (1.575 m), weight 76.1 kg, SpO2 98 %.    Intake/Output from previous day: 01/11 0701 - 01/12 0700 In: 1723.2 [P.O.:120; I.V.:1603.2] Out: 2000 [Urine:2000]  Intake/Output this shift: Total I/O In: -  Out: 350 [Urine:350]   General appearance:  Alert, NAD. Flat affect. Resp:  CTA but decr breath sounds Cardio:  RRR GI:  Distended, moderately tender diffusely without masses or rebound. BS hypoactive. Extremities:  Trace edema.   Lab Results: Results for orders placed or performed during the hospital encounter of 04/23/18 (from the past 24 hour(s))  Glucose, capillary     Status: Abnormal   Collection Time: 04/25/18 11:31 AM  Result Value Ref Range   Glucose-Capillary 121 (H) 70 - 99 mg/dL  Glucose, capillary     Status: Abnormal   Collection Time: 04/25/18  4:33 PM  Result Value Ref Range   Glucose-Capillary 158 (H) 70 - 99 mg/dL   Comment 1 Notify RN   CBC     Status: Abnormal   Collection Time: 04/25/18  5:17 PM  Result Value Ref Range   WBC 18.1 (H) 4.0 - 10.5 K/uL   RBC 4.33 3.87 - 5.11 MIL/uL   Hemoglobin 12.1 12.0 - 15.0 g/dL   HCT 35.5 (L) 36.0 - 46.0 %   MCV 82.0 80.0 - 100.0 fL   MCH 27.9 26.0 - 34.0 pg   MCHC 34.1 30.0 - 36.0 g/dL   RDW 14.1 11.5 - 15.5 %   Platelets 295 150 - 400 K/uL   nRBC 0.0 0.0 - 0.2 %  Glucose, capillary     Status: Abnormal   Collection Time: 04/26/18  8:18 AM  Result Value Ref Range   Glucose-Capillary 133 (H) 70 - 99 mg/dL     Recent Labs    04/24/18 0600 04/25/18 1717  WBC 11.7* 18.1*  HGB 11.7* 12.1  HCT 35.7* 35.5*  PLT 304 295   BMET Recent Labs    04/24/18 0600 04/25/18 0618  NA 141 140  K 3.0* 3.4*  CL 112* 111  CO2 23 19*  GLUCOSE 135* 120*  BUN 10 7  CREATININE 0.66 0.63  CALCIUM 7.7* 7.9*   LFT No results for input(s): PROT, ALBUMIN, AST, ALT, ALKPHOS, BILITOT, BILIDIR, IBILI in the last 72 hours. PT/INR No results for input(s): LABPROT, INR in the last 72 hours. Hepatitis Panel No results for input(s): HEPBSAG, HCVAB, HEPAIGM, HEPBIGM in the last 72 hours. C-Diff No results for input(s): CDIFFTOX in the last 72 hours. No results for input(s): CDIFFPCR in the last 72 hours.   Studies/Results: Dg Chest 1 View  Result Date: 04/25/2018 CLINICAL DATA:  Shortness of breath. EXAM: CHEST  1 VIEW COMPARISON:  Chest x-ray dated Sep 03, 2010. FINDINGS: The heart size and mediastinal contours are within normal limits. Normal pulmonary vascularity. Low lung volumes with mild bibasilar atelectasis. Blunting of the left costophrenic angle.  No consolidation or pneumothorax. No acute osseous abnormality. IMPRESSION: 1. Low lung volumes with mild bibasilar atelectasis. 2. Small left pleural effusion. Electronically Signed   By: Titus Dubin M.D.   On: 04/25/2018 17:12    Scheduled Inpatient Medications:   . amLODipine  5 mg Oral Daily  . docusate sodium  100 mg Oral BID  . insulin aspart  0-9 Units Subcutaneous Q6H    Continuous Inpatient Infusions:   . 0.9 % NaCl with KCl 20 mEq / L      PRN Inpatient Medications:  acetaminophen **OR** acetaminophen, hydrALAZINE, HYDROmorphone (DILAUDID) injection, ketorolac, ondansetron **OR** ondansetron (ZOFRAN) IV, oxyCODONE-acetaminophen    Assessment:  1. Acute on chronic pancreatitis. Ongoing. 2. Low grade fever.  3. Pancreatic  pseudocyst. 4. Leukocytosis - suggests unresolved pancreatitis. 18k 5. Fluid overload yesterday, IVF held.   Plan:  1. Continue NPO status. DO not re-feed without calling GI service, please. 2.  Resume brisk IV hydration as tolerated, discussed with attending MD Dr. Bridgett Larsson. 3.  Continue other orders, following.   Ash Mcelwain K. Alice Reichert, M.D. 04/26/2018, 11:11 AM

## 2018-04-27 DIAGNOSIS — K852 Alcohol induced acute pancreatitis without necrosis or infection: Secondary | ICD-10-CM

## 2018-04-27 DIAGNOSIS — K5900 Constipation, unspecified: Secondary | ICD-10-CM

## 2018-04-27 DIAGNOSIS — K863 Pseudocyst of pancreas: Secondary | ICD-10-CM

## 2018-04-27 LAB — BASIC METABOLIC PANEL
Anion gap: 9 (ref 5–15)
BUN: 11 mg/dL (ref 6–20)
CO2: 19 mmol/L — ABNORMAL LOW (ref 22–32)
CREATININE: 0.6 mg/dL (ref 0.44–1.00)
Calcium: 8.1 mg/dL — ABNORMAL LOW (ref 8.9–10.3)
Chloride: 111 mmol/L (ref 98–111)
GFR calc Af Amer: >60 mL/min (ref >60)
GFR calc non Af Amer: >60 mL/min (ref >60)
Glucose, Bld: 112 mg/dL — ABNORMAL HIGH (ref 70–99)
Potassium: 3.5 mmol/L (ref 3.5–5.1)
Sodium: 139 mmol/L (ref 135–145)

## 2018-04-27 LAB — GLUCOSE, CAPILLARY
Glucose-Capillary: 117 mg/dL — ABNORMAL HIGH (ref 70–99)
Glucose-Capillary: 108 mg/dL — ABNORMAL HIGH (ref 70–99)
Glucose-Capillary: 99 mg/dL (ref 70–99)
Glucose-Capillary: 133 mg/dL — ABNORMAL HIGH (ref 70–99)
Glucose-Capillary: 129 mg/dL — ABNORMAL HIGH (ref 70–99)

## 2018-04-27 LAB — MAGNESIUM: Magnesium: 2.1 mg/dL (ref 1.7–2.4)

## 2018-04-27 LAB — CBC
HEMATOCRIT: 32.5 % — AB (ref 36.0–46.0)
Hemoglobin: 11 g/dL — ABNORMAL LOW (ref 12.0–15.0)
MCH: 27.6 pg (ref 26.0–34.0)
MCHC: 33.8 g/dL (ref 30.0–36.0)
MCV: 81.7 fL (ref 80.0–100.0)
Platelets: 309 10*3/uL (ref 150–400)
RBC: 3.98 MIL/uL (ref 3.87–5.11)
RDW: 14.2 % (ref 11.5–15.5)
WBC: 12.4 10*3/uL — ABNORMAL HIGH (ref 4.0–10.5)
nRBC: 0.2 % (ref 0.0–0.2)

## 2018-04-27 MED ORDER — POLYETHYLENE GLYCOL 3350 17 G PO PACK
17.0000 g | PACK | Freq: Every day | ORAL | Status: DC | PRN
  Administered 2018-04-27: 17 g via ORAL
  Filled 2018-04-27 (×3): qty 1

## 2018-04-27 MED ORDER — METOPROLOL TARTRATE 50 MG PO TABS
25.0000 mg | ORAL_TABLET | Freq: Two times a day (BID) | ORAL | Status: DC
  Administered 2018-04-27 – 2018-04-29 (×4): 25 mg via ORAL
  Filled 2018-04-27 (×6): qty 0.5

## 2018-04-27 MED ORDER — AMLODIPINE BESYLATE 10 MG PO TABS
10.0000 mg | ORAL_TABLET | Freq: Every day | ORAL | Status: DC
  Administered 2018-04-27 – 2018-04-29 (×3): 10 mg via ORAL
  Filled 2018-04-27 (×3): qty 1

## 2018-04-27 NOTE — Progress Notes (Signed)
MD order in to complete patients home med list. Patient states she does not take any daily medications at home. Pt states she does not have a primary care provider or insurances. Care management consult in place to help patient find a primary care provider and help patient get insurance.     Hilbert Bible, RN

## 2018-04-27 NOTE — Progress Notes (Signed)
Inpatient Diabetes Program Recommendations  AACE/ADA: New Consensus Statement on Inpatient Glycemic Control  Target Ranges:  Prepandial:   less than 140 mg/dL      Peak postprandial:   less than 180 mg/dL (1-2 hours)      Critically ill patients:  140 - 180 mg/dL   Results for Amanda Hahn, Amanda Hahn (MRN [id number]) as of 04/27/2018 15:17  Ref. Range 04/26/2018 08:18 04/26/2018 12:24 04/26/2018 17:55 04/26/2018 23:42 04/27/2018 05:56 04/27/2018 12:24  Glucose-Capillary Latest Ref Range: 70 - 99 mg/dL 133 (H) 118 (H) 104 (H) 104 (H) 108 (H) 99  Results for Amanda Hahn, Amanda Hahn (MRN [id number]) as of 04/27/2018 15:17  Ref. Range 04/23/2018 14:47  Hemoglobin A1C Latest Ref Range: 4.8 - 5.6 % 8.8 (H)   Review of Glycemic Control  Diabetes history: No Outpatient Diabetes medications: NA Current orders for Inpatient glycemic control: Novolog 0-9 units TID with meals   NOTE: CBGs have ranged from 99-133 mg/dl over the past 28 hours and patient has not received any insulin over the past 28 hours. Diabetes Coordinator spoke with patient over the phone on 04/25/18.  Spoke with patient regarding DM dx. Patient up in chair and states that she continues to have abdominal pain and tried a liquid diet but pain because worse with eating so now NPO again. Patient has Living Well with DM book at bedside but states that she has not reviewed the book yet. Asked some general questions about DM control and targets. Patient was not able to provide correct answers to questions asked. Reviewed basic pathophysiology of DM and reviewed glucose and A1C goals. Discussed current glucose trends and explained that glucose has ranged fairly well while she has been NPO. Explained that once she begins to eat, glucose may increase.  Also, discussed pancreatitis and potential impact on glycemic control. Discussed Metformin which is a typical first line medication used for DM control. Explained how Metformin works and informed patient it has not been noted  what she will be discharged on yet but wanted to discuss Metformin since it is a common medication prescribed for DM control. Informed patient that RD consult would be ordered and that CM consult already ordered (patient has no insurance and will need help with establishing care for follow up and perhaps medications needs). Patient verbalized understanding of information discussed. Spoke with Abby, RN and asked that nursing work with patient on DM education with each interaction. Will follow up with patient tomorrow.  Thanks, Barnie Alderman, RN, MSN, CDE Diabetes Coordinator Inpatient Diabetes Program [phone] (Team Pager from 8am to 5pm)

## 2018-04-27 NOTE — Progress Notes (Signed)
[facility] NAME: Amanda Hahn    MR#:  [id number]  DATE OF BIRTH:  03-08-1961  SUBJECTIVE:  CHIEF COMPLAINT:   Chief Complaint  Patient presents with  . Abdominal Pain   The patient has better abdominal pain, she wants to eat. REVIEW OF SYSTEMS:  Review of Systems  Constitutional: Negative for chills and fever.  HENT: Negative for hearing loss.   Eyes: Negative for blurred vision and double vision.  Respiratory: Negative for cough and hemoptysis.   Cardiovascular: Negative for chest pain and palpitations.  Gastrointestinal: Positive for abdominal pain. Negative for diarrhea, heartburn, nausea and vomiting.  Genitourinary: Negative for dysuria and urgency.  Musculoskeletal: Negative for myalgias and neck pain.  Skin: Negative for itching and rash.  Neurological: Negative for dizziness and headaches.  Psychiatric/Behavioral: Negative for depression and hallucinations.    DRUG ALLERGIES:  No Known Allergies VITALS:  Blood pressure (!) 154/89, pulse (!) 103, temperature 99.4 F (37.4 C), temperature source Oral, resp. rate 20, height 5\' 2"  (1.575 m), weight 76.1 kg, SpO2 97 %. PHYSICAL EXAMINATION:   Physical Exam  Constitutional: She is oriented to person, place, and time. She appears well-developed and well-nourished.  HENT:  Head: Normocephalic and atraumatic.  Eyes: Pupils are equal, round, and reactive to light. Conjunctivae and EOM are normal.  Neck: Normal range of motion. Neck supple.  Cardiovascular: Normal rate and regular rhythm.  Pulmonary/Chest: Effort normal and breath sounds normal.  Abdominal: Soft. Bowel sounds are normal. There is abdominal tenderness. There is no rebound and no guarding.  Abdominal mass due to incisional hernia  Musculoskeletal: Normal range of motion.        General: No edema.  Neurological: She is alert and oriented to person, place, and time.  Skin: Skin is warm.  Psychiatric:  She has a normal mood and affect. Her behavior is normal.   LABORATORY PANEL:  Female CBC Recent Labs  Lab 04/27/18 0308  WBC 12.4*  HGB 11.0*  HCT 32.5*  PLT 309   ------------------------------------------------------------------------------------------------------------------ Chemistries  Recent Labs  Lab 04/22/18 2216  04/27/18 0308  NA 137   < > 139  K 3.3*   < > 3.5  CL 106   < > 111  CO2 21*   < > 19*  GLUCOSE 342*   < > 112*  BUN 14   < > 11  CREATININE 0.77   < > 0.60  CALCIUM 8.8*   < > 8.1*  MG  --    < > 2.1  AST 22  --   --   ALT 12  --   --   ALKPHOS 101  --   --   BILITOT 0.4  --   --    < > = values in this interval not displayed.   RADIOLOGY:  No results found. ASSESSMENT AND PLAN:   This is a 58 year old female admitted for pancreatitis.  1.    Acute on chronic pancreatitis: Elevated lipase and abdominal pain.   Denies any nausea vomiting at this time.   Started clear liquid diet and continue IV fluid support. Noted evidence of cholelithiasis with gas containing stone at the fundus on CAT scan.   Dr. Windell Moment does not recommend to proceed with cholecystectomy until the suspected pseudocyst is addressed.  The patient complains of abdominal pain after clear liquid diet. Discontinued clear liquid diet, n.p.o. with IV fluid support. Lipase decreased to 68. Try clear  liquid diet per Dr. Vicente Males.  2. 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 A pseudocyst is favored. Clinical history makes subacute esophageal rupture or abscess unlikely. This would be an unusual appearance for neoplasm. Dr. Marius Ditch does not recommend upper endoscopic evaluation in setting of acute pancreatitis. She would wait until her acute pancreatitis resolves and follow-up as outpatient, perform EGD and repeat imaging at that time.  3.  Hypertension: Continue p.o. Norvasc, Lopressor 25 mg IV every 12 hours.  4.     Diabetes with glucosuria: The patient had more than 500 mg/dL glucose in her urine.  Hemoglobin 8.8.  Diabetes is possible related to pancreatitis and pancreatic cyst. Better controlled with sliding scale.  Hypokalemia.  Improved with potassium supplement.  Magnesium is normal.  All the records are reviewed and case discussed with Care Management/Social Worker. Management plans discussed with the patient, family and they are in agreement.  CODE STATUS: Full Code  TOTAL TIME TAKING CARE OF THIS PATIENT: 27 minutes.   More than 50% of the time was spent in counseling/coordination of care: YES  POSSIBLE D/C IN 2 DAYS, DEPENDING ON CLINICAL CONDITION.   Demetrios Loll M.D on 04/27/2018 at 4:29 PM  Between 7am to 6pm - Pager - [phone]  After 6pm go to [email or web address] - Proofreader  [facility]  Office  [phone]  CC: Primary care physician; Patient, No Pcp Per  Note: This dictation was prepared with Dragon dictation along with smaller phrase technology. Any transcriptional errors that result from this process are unintentional.

## 2018-04-27 NOTE — Progress Notes (Signed)
   Jonathon Bellows , MD [address], [place], [place], [place], [address] [address] [place], [place], [place], [address] Phone: [phone]  Fax: [phone]   Amanda Hahn is being followed for acute pancreatitis   Subjective: Feels much better, minimal abdominal pain, tolerated clears.    Objective: Vital signs in last 24 hours: Vitals:   04/26/18 2342 04/27/18 0415 04/27/18 0612 04/27/18 0917  BP: 139/71 (!) 155/85  (!) 158/93  Pulse: 97 (!) 102  (!) 106  Resp: 18 20  20   Temp: 98.3 F (36.8 C) 99 F (37.2 C)  99.1 F (37.3 C)  TempSrc: Oral Oral  Oral  SpO2: 96% 98%  98%  Weight:   76.1 kg   Height:       Weight change: -0.024 kg  Intake/Output Summary (Last 24 hours) at 04/27/2018 1051 Last data filed at 04/27/2018 1028 Gross per 24 hour  Intake 1548.26 ml  Output 2125 ml  Net -576.74 ml     Exam: Heart:: Regular rate and rhythm, S1S2 present or without murmur or extra heart sounds Lungs: normal, clear to auscultation and clear to auscultation and percussion Abdomen: soft , non distended, mild epigastric tenderness   Lab Results: @LABTEST2 @ Micro Results: No results found for this or any previous visit (from the past 240 hour(s)). Studies/Results: Dg Chest 1 View  Result Date: 04/25/2018 CLINICAL DATA:  Shortness of breath. EXAM: CHEST  1 VIEW COMPARISON:  Chest x-ray dated Sep 03, 2010. FINDINGS: The heart size and mediastinal contours are within normal limits. Normal pulmonary vascularity. Low lung volumes with mild bibasilar atelectasis. Blunting of the left costophrenic angle. No consolidation or pneumothorax. No acute osseous abnormality. IMPRESSION: 1. Low lung volumes with mild bibasilar atelectasis. 2. Small left pleural effusion. Electronically Signed   By: Titus Dubin M.D.   On: 04/25/2018 17:12   Medications: I have reviewed the patient's current medications. Scheduled Meds: . amLODipine  5 mg Oral Daily  . docusate sodium  100 mg  Oral BID  . insulin aspart  0-9 Units Subcutaneous Q6H  . metoprolol tartrate  5 mg Intravenous Q12H   Continuous Infusions: . 0.9 % NaCl with KCl 20 mEq / L 75 mL/hr at 04/27/18 1028   PRN Meds:.acetaminophen **OR** acetaminophen, hydrALAZINE, HYDROmorphone (DILAUDID) injection, ketorolac, ondansetron **OR** ondansetron (ZOFRAN) IV, oxyCODONE-acetaminophen   Assessment: Active Problems:   Pancreatitis  Amanda Hahn 58 y.o. female admitted to the hospital on 04/23/2018 with pancreatitis. Known history of alcohol abuse and pancreatitis in the past. Last drink at X mas 2019 . Multiloculated 6 cm mass in the lower mediastinum extending through esophageal hiatus favoring a pseudocyst seen on Ct scan. Feels better today   Plan: 1. Continue clears - if does well advance to full liquid diet tomorrow  2. Quit all alcohol  3. If pain worsens, change to NPO and get CT scan of the abdomen to rule out enlargement of pancreatic pseudocyst.  4. If has no pain then repeat CT abdomen in 6-8 weeks to check for resolution of the large cyst.  5. Has not had a bowel movement for a few days informed nurse for miralax/supository    LOS: 4 days   Jonathon Bellows, MD 04/27/2018, 10:51 AM

## 2018-04-27 NOTE — Plan of Care (Signed)
Patient's BP controlled with IV Metoprolol; CBG's are 104 tonight (no insulin required); IV fluids continued; IV site clear and soft; patient remains NPO; voiding; patient has slept at long intervals tonight; patient got teary when talking about her present illness; nurse used active listening and reassured patient; patient ambulating in room independently and with assistance.

## 2018-04-27 NOTE — Progress Notes (Signed)
MD Bridgett Larsson in room evaluating patient. MD made aware by RN of lump in right lower quadrant of abdomen. MD evaluated/palpated lump and stated he would review patients history. No new orders given at this time.    Hilbert Bible, RN

## 2018-04-27 NOTE — Progress Notes (Signed)
[facility] Hospital Day(s): 4.   Post op day(s):  Marland Kitchen   Interval History: Patient seen and examined. Patient reports continue with intermittent abdominal pain. Pain sometimes in epigastric area, sometimes in the lower abdomen. Denies vomiting.   Vital signs in last 24 hours: [min-max] current  Temp:  [98.1 F (36.7 C)-99.1 F (37.3 C)] 98.1 F (36.7 C) (01/13 1229) Pulse Rate:  [92-106] 98 (01/13 1229) Resp:  [18-20] 20 (01/13 1229) BP: (139-158)/(71-96) 155/96 (01/13 1229) SpO2:  [96 %-100 %] 100 % (01/13 1229) Weight:  [76.1 kg] 76.1 kg (01/13 0612)     Height: 5\' 2"  (157.5 cm) Weight: 76.1 kg BMI (Calculated): 30.67   Physical Exam:  Constitutional: alert, cooperative and no distress  Respiratory: breathing non-labored at rest  Cardiovascular: regular rate and sinus rhythm  Gastrointestinal: soft, non-tender, and non-distended  Labs:  CBC Latest Ref Rng & Units 04/27/2018 04/25/2018 04/24/2018  WBC 4.0 - 10.5 K/uL 12.4(H) 18.1(H) 11.7(H)  Hemoglobin 12.0 - 15.0 g/dL 11.0(L) 12.1 11.7(L)  Hematocrit 36.0 - 46.0 % 32.5(L) 35.5(L) 35.7(L)  Platelets 150 - 400 K/uL 309 295 304   CMP Latest Ref Rng & Units 04/27/2018 04/25/2018 04/24/2018  Glucose 70 - 99 mg/dL 112(H) 120(H) 135(H)  BUN 6 - 20 mg/dL 11 7 10   Creatinine 0.44 - 1.00 mg/dL 0.60 0.63 0.66  Sodium 135 - 145 mmol/L 139 140 141  Potassium 3.5 - 5.1 mmol/L 3.5 3.4(L) 3.0(L)  Chloride 98 - 111 mmol/L 111 111 112(H)  CO2 22 - 32 mmol/L 19(L) 19(L) 23  Calcium 8.9 - 10.3 mg/dL 8.1(L) 7.9(L) 7.7(L)  Total Protein 6.5 - 8.1 g/dL - - -  Total Bilirubin 0.3 - 1.2 mg/dL - - -  Alkaline Phos 38 - 126 U/L - - -  AST 15 - 41 U/L - - -  ALT 0 - 44 U/L - - -    Imaging studies: No new pertinent imaging studies   Assessment/Plan:  58 y.o.femalewith acute over chronic pancreatitis, complicated by pertinent comorbidities includinghypertension, suspected diabetes and chronic pancreatitis. Patient continue with  intermittent abdominal pain. I recommend to continue treatment for pancreatitis (IVF's, pain management). Lower abdomen lump is an incisional hernia. No indication for surgical management at this hernia at this moment. Hernia should be addressed as an elective basis. Contraindicated to perform this surgery during acute episode of pancreatitis.   Arnold Long, MD

## 2018-04-28 LAB — GLUCOSE, CAPILLARY
Glucose-Capillary: 117 mg/dL — ABNORMAL HIGH (ref 70–99)
Glucose-Capillary: 131 mg/dL — ABNORMAL HIGH (ref 70–99)
Glucose-Capillary: 145 mg/dL — ABNORMAL HIGH (ref 70–99)
Glucose-Capillary: 147 mg/dL — ABNORMAL HIGH (ref 70–99)

## 2018-04-28 MED ORDER — METOPROLOL TARTRATE 25 MG PO TABS: 25.0000 mg | ORAL_TABLET | Freq: Two times a day (BID) | ORAL | 2 refills | Status: AC

## 2018-04-28 MED ORDER — METFORMIN HCL 500 MG PO TABS: 500.0000 mg | ORAL_TABLET | Freq: Two times a day (BID) | ORAL | 2 refills | Status: AC

## 2018-04-28 MED ORDER — AMLODIPINE BESYLATE 10 MG PO TABS: 10.0000 mg | ORAL_TABLET | Freq: Every day | ORAL | 2 refills | Status: AC

## 2018-04-28 NOTE — Progress Notes (Signed)
Inpatient Diabetes Program Recommendations  AACE/ADA: New Consensus Statement on Inpatient Glycemic Control  Target Ranges:  Prepandial:   less than 140 mg/dL      Peak postprandial:   less than 180 mg/dL (1-2 hours)      Critically ill patients:  140 - 180 mg/dL   Results for Amanda Hahn, Amanda Hahn (MRN [id number]) as of 04/28/2018 14:24  Ref. Range 04/27/2018 05:56 04/27/2018 12:24 04/27/2018 17:55 04/27/2018 22:44 04/28/2018 05:16 04/28/2018 11:30  Glucose-Capillary Latest Ref Range: 70 - 99 mg/dL 108 (H) 99 133 (H) 129 (H) 145 (H) 131 (H)  Results for Amanda Hahn, Amanda Hahn (MRN [id number]) as of 04/28/2018 14:24  Ref. Range 04/23/2018 14:47  Hemoglobin A1C Latest Ref Range: 4.8 - 5.6 % 8.8 (H)   Review of Glycemic Control  Diabetes history: No Outpatient Diabetes medications: NA Current orders for Inpatient glycemic control: Novolog 0-9 units Q6H  Inpatient Diabetes Program Recommendations: Outpatient DM recommendations: Recommend discharging patient on Metformin 500 mg BID, have patient check glucose 1-2 times per day, and follow up with provider at one of the local clinics.  NOTE: Met with patient again today to review new DM dx. Patient was not able to answer questions regarding target glucose or A1C. Patient has not yet reviewed Living Well with DM book. Encouraged patient to read entire book to increase DM knowledge. Reviewed Living Well with DM book and discussed DM. Explained impact pancreatitis could have on DM control. Patient states that she has tolerated liquid diet and diet was advanced to soft diet but she has not had soft diet yet.  Informed patient it would be recommended that she be discharged on Metformin. Patient states that she took Metformin in 2011 for 60 days after she had her last episode of pancreatitis. Discussed Metformin and how it works for DM control.  Encouraged patient to take medication as prescribed, to check glucose and keep a log of glucose readings, and to follow up with  provider at one of the clinics. CM consult ordered and they are following to assist with follow up and medication needs. Patient verbalized understanding of information discussed and states that she does not have any questions or concerns at this time.  Will ask if CM has any glucometers on hand that they could provide to patient.   Thanks, Barnie Alderman, RN, MSN, CDE Diabetes Coordinator Inpatient Diabetes Program [phone] (Team Pager from 8am to 5pm)

## 2018-04-28 NOTE — Progress Notes (Signed)
Amanda Hahn at [place] NAME: Amanda Hahn    MR#:  [id number]  DATE OF BIRTH:  03/26/1961  SUBJECTIVE:  CHIEF COMPLAINT:   Chief Complaint  Patient presents with  . Abdominal Pain   The patient has better abdominal pain, she tolerated clear liquid diet and had a bowel movement. REVIEW OF SYSTEMS:  Review of Systems  Constitutional: Negative for chills and fever.  HENT: Negative for hearing loss.   Eyes: Negative for blurred vision and double vision.  Respiratory: Negative for cough and hemoptysis.   Cardiovascular: Negative for chest pain and palpitations.  Gastrointestinal: Positive for abdominal pain. Negative for blood in stool, constipation, diarrhea, heartburn, melena, nausea and vomiting.  Genitourinary: Negative for dysuria and urgency.  Musculoskeletal: Negative for myalgias and neck pain.  Skin: Negative for itching and rash.  Neurological: Negative for dizziness and headaches.  Psychiatric/Behavioral: Negative for depression and hallucinations.    DRUG ALLERGIES:  No Known Allergies VITALS:  Blood pressure 127/85, pulse 98, temperature 98.5 F (36.9 C), temperature source Oral, resp. rate 18, height 5\' 2"  (1.575 m), weight 76.7 kg, SpO2 98 %. PHYSICAL EXAMINATION:   Physical Exam  Constitutional: She is oriented to person, place, and time. She appears well-developed and well-nourished.  HENT:  Head: Normocephalic and atraumatic.  Eyes: Pupils are equal, round, and reactive to light. Conjunctivae and EOM are normal.  Neck: Normal range of motion. Neck supple.  Cardiovascular: Normal rate and regular rhythm.  Pulmonary/Chest: Effort normal and breath sounds normal.  Abdominal: Soft. Bowel sounds are normal. She exhibits distension. There is abdominal tenderness. There is no rebound and no guarding.  Abdominal mass due to incisional hernia  Musculoskeletal: Normal range of motion.        General: No edema.    Neurological: She is alert and oriented to person, place, and time.  Skin: Skin is warm.  Psychiatric: She has a normal mood and affect. Her behavior is normal.   LABORATORY PANEL:  Female CBC Recent Labs  Lab 04/27/18 0308  WBC 12.4*  HGB 11.0*  HCT 32.5*  PLT 309   ------------------------------------------------------------------------------------------------------------------ Chemistries  Recent Labs  Lab 04/22/18 2216  04/27/18 0308  NA 137   < > 139  K 3.3*   < > 3.5  CL 106   < > 111  CO2 21*   < > 19*  GLUCOSE 342*   < > 112*  BUN 14   < > 11  CREATININE 0.77   < > 0.60  CALCIUM 8.8*   < > 8.1*  MG  --    < > 2.1  AST 22  --   --   ALT 12  --   --   ALKPHOS 101  --   --   BILITOT 0.4  --   --    < > = values in this interval not displayed.   RADIOLOGY:  No results found. ASSESSMENT AND PLAN:   This is a 58 year old female admitted for pancreatitis.  1.    Acute on chronic pancreatitis: Elevated lipase and abdominal pain.   Denies any nausea vomiting at this time.   Started clear liquid diet and continue IV fluid support. Noted evidence of cholelithiasis with gas containing stone at the fundus on CAT scan.   Dr. Windell Moment does not recommend to proceed with cholecystectomy until the suspected pseudocyst is addressed.  Lipase decreased to 68. Advance to full liquid diet then soft diet  if tolerated per Dr. Vicente Males.  2. 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 A pseudocyst is favored. Clinical history makes subacute esophageal rupture or abscess unlikely. This would be an unusual appearance for neoplasm. Dr. Marius Ditch does not recommend upper endoscopic evaluation in setting of acute pancreatitis. She would wait until her acute pancreatitis resolves and follow-up as outpatient, perform EGD and repeat imaging at that time.  3.  Hypertension: Continue p.o. Norvasc, Lopressor 25 mg po every 12  hours.  4.    Diabetes with glucosuria: The patient had more than 500 mg/dL glucose in her urine.  Hemoglobin 8.8.  Diabetes is possible related to pancreatitis and pancreatic cyst. Better controlled with sliding scale.  The patient did need to start metformin after discharge.  Hypokalemia.  Improved with potassium supplement.  Magnesium is normal.  All the records are reviewed and case discussed with Care Management/Social Worker. Management plans discussed with the patient, family and they are in agreement.  CODE STATUS: Full Code  TOTAL TIME TAKING CARE OF THIS PATIENT: 27 minutes.   More than 50% of the time was spent in counseling/coordination of care: YES  POSSIBLE D/C IN 2 DAYS, DEPENDING ON CLINICAL CONDITION.   Demetrios Loll M.D on 04/28/2018 at 3:33 PM  Between 7am to 6pm - Pager - [phone]  After 6pm go to [email or web address] - Proofreader  [facility]  Office  [phone]  CC: Primary care physician; Patient, No Pcp Per  Note: This dictation was prepared with Dragon dictation along with smaller phrase technology. Any transcriptional errors that result from this process are unintentional.

## 2018-04-28 NOTE — Progress Notes (Signed)
   Jonathon Bellows , MD [address], [place], [place], [place], [address] [address], [place], [place], [address] Phone: [phone]  Fax: [phone]   Amanda Hahn is being followed for acute pancreatitis   Subjective:   Feels much better tolerating PO   Objective: Vital signs in last 24 hours: Vitals:   04/27/18 2259 04/28/18 0301 04/28/18 0500 04/28/18 0715  BP: (!) 150/93 (!) 149/96  (!) 146/83  Pulse: (!) 108 96  99  Resp: 20 18  20   Temp: 98.7 F (37.1 C) 98.6 F (37 C)  98.7 F (37.1 C)  TempSrc: Oral Oral  Oral  SpO2: 100% 97%  97%  Weight:   76.7 kg   Height:       Weight change: 0.568 kg  Intake/Output Summary (Last 24 hours) at 04/28/2018 2751 Last data filed at 04/28/2018 0710 Gross per 24 hour  Intake 1125.96 ml  Output 1115 ml  Net 10.96 ml     Exam: Heart:: Regular rate and rhythm, S1S2 present or without murmur or extra heart sounds Lungs: normal, clear to auscultation and clear to auscultation and percussion Abdomen: soft, nontender, normal bowel sounds   Lab Results: @LABTEST2 @ Micro Results: No results found for this or any previous visit (from the past 240 hour(s)). Studies/Results: No results found. Medications: I have reviewed the patient's current medications. Scheduled Meds: . amLODipine  10 mg Oral Daily  . docusate sodium  100 mg Oral BID  . insulin aspart  0-9 Units Subcutaneous Q6H  . metoprolol tartrate  25 mg Oral BID   Continuous Infusions: . 0.9 % NaCl with KCl 20 mEq / L 75 mL/hr at 04/28/18 0255   PRN Meds:.acetaminophen **OR** acetaminophen, hydrALAZINE, HYDROmorphone (DILAUDID) injection, ketorolac, ondansetron **OR** ondansetron (ZOFRAN) IV, oxyCODONE-acetaminophen, polyethylene glycol   Assessment: Active Problems:   Pancreatitis  Amanda Hahn 58 y.o. female admitted to the hospital on 04/23/2018 with pancreatitis. Known history of alcohol abuse and pancreatitis in the past. Last drink at [facility]  2019 . Multiloculated 6 cm mass in the lower mediastinum extending through esophageal hiatus favoring a pseudocyst seen on Ct scan. Feels better today   Plan: 1. Advance to full liquids and then to soft diet  2. Quit all alcohol  3. If pain worsens, change to NPO and get CT scan of the abdomen to rule out enlargement of pancreatic pseudocyst.  4. If has no pain then repeat CT abdomen in 6-8 weeks to check for resolution of the large cyst.  5.Will also consider possible EGD as an outpatient   I will sign off.  Please call me if any further GI concerns or questions.  We would like to thank you for the opportunity to participate in the care of Amanda Hahn.      LOS: 5 days   Jonathon Bellows, MD 04/28/2018, 9:37 AM

## 2018-04-28 NOTE — Plan of Care (Signed)
  RD consulted for nutrition education regarding diabetes. Also identified need for education on pancreatitis.  Met with patient at bedside. She reports she is concerned about her diagnosis of diabetes as she has never had high blood sugar before. She reports she did have acute pancreatitis about 4 years ago. She is not on any PERT. She is having well-formed bowel movements. She reports some gas right now, but she has been tolerating her liquid diet. No worsening of pain post-prandially. She typically eats 3 meals per day plus snacks between meals. She reports eating some higher fat foods such as biscuits, cookies, chips, macaroni and cheese, and fried chicken. 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.  Lab Results  Component Value Date   HGBA1C 8.8 (H) 04/23/2018   RD provided "Ready, Set, Start Counting" handout from the Academy of Nutrition and Dietetics. Discussed different food groups and their effects on blood sugar, emphasizing carbohydrate-containing foods. Provided list of carbohydrates and recommended serving sizes of common foods.  Discussed importance of controlled and consistent carbohydrate intake throughout the day. Provided examples of ways to balance meals/snacks and encouraged intake of high-fiber, whole grain complex carbohydrates. Teach back method used.  Lipase     Component Value Date/Time   LIPASE 68 (H) 04/25/2018 0618    Lipid Panel     Component Value Date/Time   TRIG 105 04/24/2018 0600   RD provided "Pancreatitis Nutrition Therapy" and "Pancreatitis Label Reading Tips" handouts from the Academy of Nutrition and Dietetics. Discussed role of the pancreas and how it helps digest and absorb food. Reviewed that foods high in fat are especially hard to digest and can cause pain. Encouraged patient to eat a low-fat diet where total fat intake is limited to 25-30% of Calories eaten per day. For patient this comes out to 45-60 grams of fat daily. Encouraged intake of small,  low-fat meals throughout the day to help ease pain and encouraged patient to drink adequate fluids. Discouraged drinking alcoholic beverages. Provided a list of foods not recommended on a low-fat diet and discussed alternatives patient will enjoy. Also reviewed label reading tips, especially the importance of comparing portion size to amount patient is planning on eating. Teach back method used.  Expect fair to good compliance.  Body mass index is 30.91 kg/m. Pt meets criteria for obesity class I based on current BMI.  Current diet order is full liquid diet. Labs and medications reviewed. No further nutrition interventions warranted at this time. RD contact information provided. If additional nutrition issues arise, please re-consult RD.  Willey Blade, MS, [place], LDN Office: [phone] Pager: [phone] After Hours/Weekend Pager: [phone]

## 2018-04-28 NOTE — Progress Notes (Signed)
Amanda Hahn with Care Management notified of consult regarding discharge planning for pt need for PCP, medications, and no insurance.

## 2018-04-29 LAB — GLUCOSE, CAPILLARY
Glucose-Capillary: 139 mg/dL — ABNORMAL HIGH (ref 70–99)
Glucose-Capillary: 131 mg/dL — ABNORMAL HIGH (ref 70–99)
Glucose-Capillary: 134 mg/dL — ABNORMAL HIGH (ref 70–99)
Glucose-Capillary: 127 mg/dL — ABNORMAL HIGH (ref 70–99)

## 2018-04-29 MED ORDER — SIMETHICONE 80 MG PO CHEW: 80.0000 mg | CHEWABLE_TABLET | Freq: Four times a day (QID) | ORAL | 0 refills | Status: DC | PRN

## 2018-04-29 MED ORDER — OXYCODONE-ACETAMINOPHEN 5-325 MG PO TABS: 1.0000 | ORAL_TABLET | Freq: Four times a day (QID) | ORAL | 0 refills | Status: DC | PRN

## 2018-04-29 MED ORDER — SIMETHICONE 80 MG PO CHEW
80.0000 mg | CHEWABLE_TABLET | Freq: Four times a day (QID) | ORAL | Status: DC | PRN
  Administered 2018-04-29 (×2): 80 mg via ORAL
  Filled 2018-04-29 (×2): qty 1

## 2018-04-29 NOTE — Progress Notes (Signed)
Advanced diet to soft diet, encouraged pt to order breakfast. Pt ordered grits and dry toast. Only ate 2 bites of toast due to poor appetite. Pt is drinking PO fluids with multiple juice and water at bedside.Will continue to monitor.

## 2018-04-29 NOTE — Progress Notes (Signed)
Provided and reviewed discharge paperwork and prescriptions. Provided teach back instruction on use of glucose monitor/lancets/test strips which was provided to pt by care management in order for pt to check her blood glucose twice daily. Pt also verbalized understanding of diabetes teaching provided by diabetes coordinator (see note), pt asked appropriate questions regarding diet and they were answered accordingly. This nurse faxed prescriptions and paperwork (provided by care management) to medication management so that prescriptions can be picked up as soon as the office opens at 0830 in the morning, 04/30/2018. Follow up appointment with GI physician provided. Pt to contact [facility] tomorrow morning for follow up appointment as soon as possible to establish primary care. VS stable. Pt verbalized understanding of information provided, teach back method was utilized. Pt discharged with her daughter to transport her home. Taken to visitor entrance via wheelchair by hospital volunteer to go home.

## 2018-04-29 NOTE — Care Management (Signed)
RNCM consult for follow up care. Medication assistance.  RNCM request printed prescriptions from MD to initiate assistance.  Per MD will need to wait for diet to be advanced to determine what medications will be needed.RNCM to provide patient with application to [facility], Medication Management, and glucometer kit. Will follow up 1/15

## 2018-04-29 NOTE — Progress Notes (Signed)
Pt did order and ate a small amount of her lunch which consisted of rice and pot roast with gravy. Continues to tolerate fluids as well. Poor appetite, but denies nausea/vomiting. Will continue to monitor.

## 2018-04-29 NOTE — Discharge Summary (Signed)
[name]    MR#:  [id number]  DATE OF BIRTH:  October 31, 1960  DATE OF ADMISSION:  04/23/2018   ADMITTING PHYSICIAN: Harrie Foreman, MD  DATE OF DISCHARGE:  04/29/2018 PRIMARY CARE PHYSICIAN: Patient, No Pcp Per   ADMISSION DIAGNOSIS:  Mediastinal mass [J98.59] Acute on chronic pancreatitis ([place]) [K85.90, K86.1] DISCHARGE DIAGNOSIS:  Active Problems:   Pancreatitis  SECONDARY DIAGNOSIS:   Past Medical History:  Diagnosis Date  . History of pancreatitis   . Hypertension    HOSPITAL COURSE:  This is a 58 year old female admitted for pancreatitis.  1.   Acute on chronic pancreatitis: Elevated lipase and abdominal pain.  Denies any nausea vomiting at this time.   Started clear liquid diet and continue IV fluid support. Noted evidence of cholelithiasis with gas containing stone at the fundus on CAT scan.   Dr. Windell Moment does not recommend to proceed with cholecystectomy until the suspected pseudocyst is addressed.  Lipase decreased to 68. Advance to full liquid diet then soft diet if tolerated per Dr. Vicente Males. The patient tolerated full liquid diet.  Started soft diet.   She is advised to eat small amount of food with multiple times as tolerated.  2. 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 A pseudocyst is favored. Clinical history makes subacute esophageal rupture or abscess unlikely. This would be an unusual appearance for neoplasm. Dr. Marius Ditch does not recommend upper endoscopicevaluation in setting of acute pancreatitis. She would wait until her acute pancreatitis resolves and follow-up as outpatient, perform EGD and repeat imaging at that time.  3. Hypertension: Continue p.o. Norvasc, Lopressor 25 mg po every 12 hours.  4.  Diabetes with glucosuria:The patient had more than 500 mg/dL glucose in her urine.  Hemoglobin 8.8.   Diabetes is possible related to pancreatitis and pancreatic cyst. Better controlled with sliding scale.  The patient needs to start metformin after discharge.  Hypokalemia.  Improved with potassium supplement.  Magnesium is normal. DISCHARGE CONDITIONS:  Stable, discharge to home today. CONSULTS OBTAINED:  Treatment Team:  Herbert Pun, MD DRUG ALLERGIES:  No Known Allergies DISCHARGE MEDICATIONS:   Allergies as of 04/29/2018   No Known Allergies     Medication List    TAKE these medications   amLODipine 10 MG tablet Commonly known as:  NORVASC Take 1 tablet (10 mg total) by mouth daily.   metFORMIN 500 MG tablet Commonly known as:  GLUCOPHAGE Take 1 tablet (500 mg total) by mouth 2 (two) times daily with a meal.   metoprolol tartrate 25 MG tablet Commonly known as:  LOPRESSOR Take 1 tablet (25 mg total) by mouth 2 (two) times daily.   oxyCODONE-acetaminophen 5-325 MG tablet Commonly known as:  PERCOCET/ROXICET Take 1 tablet by mouth every 6 (six) hours as needed for moderate pain.   simethicone 80 MG chewable tablet Commonly known as:  MYLICON Chew 1 tablet (80 mg total) by mouth every 6 (six) hours as needed for flatulence.        DISCHARGE INSTRUCTIONS:  See AVS.  If you experience worsening of your admission symptoms, develop shortness of breath, life threatening emergency, suicidal or homicidal thoughts you must seek medical attention immediately by calling 911 or calling your MD immediately  if symptoms less severe.  You Must read complete instructions/literature along with all the possible adverse reactions/side effects for all the Medicines you  take and that have been prescribed to you. Take any new Medicines after you have completely understood and accpet all the possible adverse reactions/side effects.   Please note  You were cared for by a hospitalist during your hospital stay. If you have any questions about your discharge medications or the  care you received while you were in the hospital after you are discharged, you can call the unit and asked to speak with the hospitalist on call if the hospitalist that took care of you is not available. Once you are discharged, your primary care physician will handle any further medical issues. Please note that NO REFILLS for any discharge medications will be authorized once you are discharged, as it is imperative that you return to your primary care physician (or establish a relationship with a primary care physician if you do not have one) for your aftercare needs so that they can reassess your need for medications and monitor your lab values.    On the day of Discharge:  VITAL SIGNS:  Blood pressure 109/78, pulse 80, temperature 98.8 F (37.1 C), temperature source Oral, resp. rate 20, height 5\' 2"  (1.575 m), weight 78 kg, SpO2 98 %. PHYSICAL EXAMINATION:  GENERAL:  58 y.o.-year-old patient lying in the bed with no acute distress.  EYES: Pupils equal, round, reactive to light and accommodation. No scleral icterus. Extraocular muscles intact.  HEENT: Head atraumatic, normocephalic. Oropharynx and nasopharynx clear.  NECK:  Supple, no jugular venous distention. No thyroid enlargement, no tenderness.  LUNGS: Normal breath sounds bilaterally, no wheezing, rales,rhonchi or crepitation. No use of accessory muscles of respiration.  CARDIOVASCULAR: S1, S2 normal. No murmurs, rubs, or gallops.  ABDOMEN: Soft, mild tenderness, mild distended. Bowel sounds present. No organomegaly or mass.  EXTREMITIES: No pedal edema, cyanosis, or clubbing.  NEUROLOGIC: Cranial nerves II through XII are intact. Muscle strength 5/5 in all extremities. Sensation intact. Gait not checked.  PSYCHIATRIC: The patient is alert and oriented x 3.  SKIN: No obvious rash, lesion, or ulcer.  DATA REVIEW:   CBC Recent Labs  Lab 04/27/18 0308  WBC 12.4*  HGB 11.0*  HCT 32.5*  PLT 309    Chemistries  Recent Labs  Lab  04/22/18 2216  04/27/18 0308  NA 137   < > 139  K 3.3*   < > 3.5  CL 106   < > 111  CO2 21*   < > 19*  GLUCOSE 342*   < > 112*  BUN 14   < > 11  CREATININE 0.77   < > 0.60  CALCIUM 8.8*   < > 8.1*  MG  --    < > 2.1  AST 22  --   --   ALT 12  --   --   ALKPHOS 101  --   --   BILITOT 0.4  --   --    < > = values in this interval not displayed.     Microbiology Results  No results found for this or any previous visit.  RADIOLOGY:  No results found.   Management plans discussed with the patient, family and they are in agreement.  CODE STATUS: Full Code   TOTAL TIME TAKING CARE OF THIS PATIENT: 35 minutes.    Demetrios Loll M.D on 04/29/2018 at 3:26 PM  Between 7am to 6pm - Pager - [phone]  After 6pm go to [email or web address] - [facility]  Office  [phone]  CC:  Primary care physician; Patient, No Pcp Per   Note: This dictation was prepared with Dragon dictation along with smaller phrase technology. Any transcriptional errors that result from this process are unintentional.

## 2018-05-02 ENCOUNTER — Inpatient Hospital Stay (HOSPITAL_COMMUNITY)
Admission: EM | Admit: 2018-05-02 | Discharge: 2018-05-19 | DRG: 439 | Disposition: A | Discharge status: Home Health | Payer: Self-pay | Attending: Internal Medicine | Admitting: Internal Medicine

## 2018-05-02 ENCOUNTER — Encounter (HOSPITAL_COMMUNITY): Payer: Self-pay

## 2018-05-02 ENCOUNTER — Other Ambulatory Visit: Payer: Self-pay

## 2018-05-02 ENCOUNTER — Emergency Department (HOSPITAL_COMMUNITY): Payer: Self-pay

## 2018-05-02 DIAGNOSIS — E11649 Type 2 diabetes mellitus with hypoglycemia without coma: Secondary | ICD-10-CM | POA: Diagnosis present

## 2018-05-02 DIAGNOSIS — R140 Abdominal distension (gaseous): Secondary | ICD-10-CM

## 2018-05-02 DIAGNOSIS — E871 Hypo-osmolality and hyponatremia: Secondary | ICD-10-CM | POA: Diagnosis not present

## 2018-05-02 DIAGNOSIS — Z0189 Encounter for other specified special examinations: Secondary | ICD-10-CM

## 2018-05-02 DIAGNOSIS — K469 Unspecified abdominal hernia without obstruction or gangrene: Secondary | ICD-10-CM | POA: Diagnosis present

## 2018-05-02 DIAGNOSIS — F1021 Alcohol dependence, in remission: Secondary | ICD-10-CM | POA: Diagnosis present

## 2018-05-02 DIAGNOSIS — Z8249 Family history of ischemic heart disease and other diseases of the circulatory system: Secondary | ICD-10-CM

## 2018-05-02 DIAGNOSIS — K8689 Other specified diseases of pancreas: Secondary | ICD-10-CM | POA: Diagnosis present

## 2018-05-02 DIAGNOSIS — Z79899 Other long term (current) drug therapy: Secondary | ICD-10-CM

## 2018-05-02 DIAGNOSIS — K863 Pseudocyst of pancreas: Secondary | ICD-10-CM | POA: Diagnosis present

## 2018-05-02 DIAGNOSIS — Z7984 Long term (current) use of oral hypoglycemic drugs: Secondary | ICD-10-CM

## 2018-05-02 DIAGNOSIS — K311 Adult hypertrophic pyloric stenosis: Secondary | ICD-10-CM | POA: Diagnosis present

## 2018-05-02 DIAGNOSIS — E876 Hypokalemia: Secondary | ICD-10-CM | POA: Diagnosis present

## 2018-05-02 DIAGNOSIS — D649 Anemia, unspecified: Secondary | ICD-10-CM | POA: Diagnosis not present

## 2018-05-02 DIAGNOSIS — K567 Ileus, unspecified: Secondary | ICD-10-CM | POA: Diagnosis not present

## 2018-05-02 DIAGNOSIS — K8020 Calculus of gallbladder without cholecystitis without obstruction: Secondary | ICD-10-CM | POA: Diagnosis present

## 2018-05-02 DIAGNOSIS — Z79891 Long term (current) use of opiate analgesic: Secondary | ICD-10-CM

## 2018-05-02 DIAGNOSIS — K860 Alcohol-induced chronic pancreatitis: Secondary | ICD-10-CM | POA: Diagnosis present

## 2018-05-02 DIAGNOSIS — F17210 Nicotine dependence, cigarettes, uncomplicated: Secondary | ICD-10-CM | POA: Diagnosis present

## 2018-05-02 DIAGNOSIS — I10 Essential (primary) hypertension: Secondary | ICD-10-CM | POA: Diagnosis present

## 2018-05-02 DIAGNOSIS — Z4659 Encounter for fitting and adjustment of other gastrointestinal appliance and device: Secondary | ICD-10-CM

## 2018-05-02 DIAGNOSIS — K859 Acute pancreatitis without necrosis or infection, unspecified: Principal | ICD-10-CM | POA: Diagnosis present

## 2018-05-02 DIAGNOSIS — E119 Type 2 diabetes mellitus without complications: Secondary | ICD-10-CM

## 2018-05-02 DIAGNOSIS — K56609 Unspecified intestinal obstruction, unspecified as to partial versus complete obstruction: Secondary | ICD-10-CM | POA: Diagnosis not present

## 2018-05-02 DIAGNOSIS — R1110 Vomiting, unspecified: Secondary | ICD-10-CM

## 2018-05-02 HISTORY — DX: Type 2 diabetes mellitus without complications: E11.9

## 2018-05-02 LAB — CBC
HCT: 38.1 % (ref 36.0–46.0)
Hemoglobin: 12.5 g/dL (ref 12.0–15.0)
MCH: 26.9 pg (ref 26.0–34.0)
MCHC: 32.8 g/dL (ref 30.0–36.0)
MCV: 82.1 fL (ref 80.0–100.0)
Platelets: 476 10*3/uL — ABNORMAL HIGH (ref 150–400)
RBC: 4.64 MIL/uL (ref 3.87–5.11)
RDW: 14.6 % (ref 11.5–15.5)
WBC: 15.1 10*3/uL — ABNORMAL HIGH (ref 4.0–10.5)
nRBC: 0 % (ref 0.0–0.2)

## 2018-05-02 LAB — URINALYSIS, ROUTINE W REFLEX MICROSCOPIC
Bacteria, UA: NONE SEEN
Bilirubin Urine: NEGATIVE
Glucose, UA: NEGATIVE mg/dL
Ketones, ur: 20 mg/dL — AB
Leukocytes, UA: NEGATIVE
Nitrite: NEGATIVE
Protein, ur: 30 mg/dL — AB
SPECIFIC GRAVITY, URINE: 1.017 (ref 1.005–1.030)
pH: 6 (ref 5.0–8.0)

## 2018-05-02 LAB — COMPREHENSIVE METABOLIC PANEL
ALT: 9 U/L (ref 0–44)
AST: 23 U/L (ref 15–41)
Albumin: 2.8 g/dL — ABNORMAL LOW (ref 3.5–5.0)
Alkaline Phosphatase: 154 U/L — ABNORMAL HIGH (ref 38–126)
Anion gap: 15 (ref 5–15)
BUN: 9 mg/dL (ref 6–20)
CO2: 25 mmol/L (ref 22–32)
Calcium: 9 mg/dL (ref 8.9–10.3)
Chloride: 96 mmol/L — ABNORMAL LOW (ref 98–111)
Creatinine, Ser: 0.62 mg/dL (ref 0.44–1.00)
GFR calc Af Amer: >60 mL/min (ref >60)
Glucose, Bld: 104 mg/dL — ABNORMAL HIGH (ref 70–99)
Potassium: 3.2 mmol/L — ABNORMAL LOW (ref 3.5–5.1)
Sodium: 136 mmol/L (ref 135–145)
Total Bilirubin: 1.2 mg/dL (ref 0.3–1.2)
Total Protein: 8.8 g/dL — ABNORMAL HIGH (ref 6.5–8.1)

## 2018-05-02 LAB — GLUCOSE, CAPILLARY: Glucose-Capillary: 84 mg/dL (ref 70–99)

## 2018-05-02 LAB — LIPASE, BLOOD: Lipase: 165 U/L — ABNORMAL HIGH (ref 11–51)

## 2018-05-02 MED ORDER — SODIUM CHLORIDE 0.9% FLUSH: 3.0000 mL | Freq: Once | INTRAVENOUS | Status: DC

## 2018-05-02 MED ORDER — POTASSIUM CHLORIDE IN NACL 20-0.9 MEQ/L-% IV SOLN
INTRAVENOUS | Status: DC
  Administered 2018-05-02 – 2018-05-05 (×6): via INTRAVENOUS
  Filled 2018-05-02 (×8): qty 1000

## 2018-05-02 MED ORDER — MORPHINE SULFATE (PF) 4 MG/ML IV SOLN
4.0000 mg | INTRAVENOUS | Status: DC | PRN
  Administered 2018-05-02: 4 mg via INTRAVENOUS
  Filled 2018-05-02: qty 1

## 2018-05-02 MED ORDER — IOPAMIDOL (ISOVUE-300) INJECTION 61%
100.0000 mL | Freq: Once | INTRAVENOUS | Status: AC | PRN
  Administered 2018-05-02: 100 mL via INTRAVENOUS

## 2018-05-02 MED ORDER — ACETAMINOPHEN 650 MG RE SUPP: 650.0000 mg | Freq: Four times a day (QID) | RECTAL | Status: DC | PRN

## 2018-05-02 MED ORDER — HYDROMORPHONE HCL 1 MG/ML IJ SOLN
1.0000 mg | INTRAMUSCULAR | Status: DC | PRN
  Administered 2018-05-02 – 2018-05-05 (×17): 1 mg via INTRAVENOUS
  Filled 2018-05-02 (×20): qty 1

## 2018-05-02 MED ORDER — ACETAMINOPHEN 325 MG PO TABS
650.0000 mg | ORAL_TABLET | Freq: Four times a day (QID) | ORAL | Status: DC | PRN
  Administered 2018-05-16 – 2018-05-18 (×3): 650 mg via ORAL
  Filled 2018-05-02 (×3): qty 2

## 2018-05-02 MED ORDER — IOPAMIDOL (ISOVUE-300) INJECTION 61%
INTRAVENOUS | Status: AC
  Filled 2018-05-02: qty 100

## 2018-05-02 MED ORDER — HYDROMORPHONE HCL 1 MG/ML IJ SOLN
1.0000 mg | Freq: Once | INTRAMUSCULAR | Status: AC
  Administered 2018-05-02: 1 mg via INTRAVENOUS
  Filled 2018-05-02: qty 1

## 2018-05-02 MED ORDER — ONDANSETRON HCL 4 MG/2ML IJ SOLN
4.0000 mg | Freq: Once | INTRAMUSCULAR | Status: AC
  Administered 2018-05-03: 4 mg via INTRAVENOUS
  Filled 2018-05-02: qty 2

## 2018-05-02 MED ORDER — METOPROLOL TARTRATE 25 MG PO TABS
25.0000 mg | ORAL_TABLET | Freq: Two times a day (BID) | ORAL | Status: DC
  Administered 2018-05-02 – 2018-05-19 (×34): 25 mg via ORAL
  Filled 2018-05-02 (×34): qty 1

## 2018-05-02 MED ORDER — SODIUM CHLORIDE (PF) 0.9 % IJ SOLN
INTRAMUSCULAR | Status: AC
  Filled 2018-05-02: qty 50

## 2018-05-02 MED ORDER — INSULIN ASPART 100 UNIT/ML ~~LOC~~ SOLN
0.0000 [IU] | Freq: Four times a day (QID) | SUBCUTANEOUS | Status: DC
  Administered 2018-05-04 – 2018-05-05 (×3): 1 [IU] via SUBCUTANEOUS
  Administered 2018-05-05: 2 [IU] via SUBCUTANEOUS
  Administered 2018-05-06 – 2018-05-08 (×7): 1 [IU] via SUBCUTANEOUS
  Administered 2018-05-09 – 2018-05-10 (×7): 2 [IU] via SUBCUTANEOUS
  Administered 2018-05-11: 1 [IU] via SUBCUTANEOUS
  Administered 2018-05-11: 2 [IU] via SUBCUTANEOUS
  Administered 2018-05-11 – 2018-05-12 (×2): 1 [IU] via SUBCUTANEOUS
  Administered 2018-05-12 – 2018-05-14 (×9): 2 [IU] via SUBCUTANEOUS
  Administered 2018-05-14: 3 [IU] via SUBCUTANEOUS
  Administered 2018-05-15 (×3): 2 [IU] via SUBCUTANEOUS
  Administered 2018-05-15: 3 [IU] via SUBCUTANEOUS
  Administered 2018-05-15 – 2018-05-16 (×2): 1 [IU] via SUBCUTANEOUS
  Administered 2018-05-16 – 2018-05-17 (×5): 2 [IU] via SUBCUTANEOUS
  Administered 2018-05-18 (×2): 1 [IU] via SUBCUTANEOUS
  Administered 2018-05-19 (×3): 2 [IU] via SUBCUTANEOUS

## 2018-05-02 MED ORDER — POTASSIUM CHLORIDE 10 MEQ/100ML IV SOLN
10.0000 meq | INTRAVENOUS | Status: AC
  Administered 2018-05-02 (×3): 10 meq via INTRAVENOUS
  Filled 2018-05-02 (×3): qty 100

## 2018-05-02 MED ORDER — ONDANSETRON HCL 4 MG/2ML IJ SOLN
4.0000 mg | Freq: Once | INTRAMUSCULAR | Status: AC | PRN
  Administered 2018-05-02: 4 mg via INTRAVENOUS
  Filled 2018-05-02: qty 2

## 2018-05-02 MED ORDER — SODIUM CHLORIDE 0.9 % IV SOLN
Freq: Once | INTRAVENOUS | Status: AC
  Administered 2018-05-02: 13:00:00 via INTRAVENOUS

## 2018-05-02 MED ORDER — BISACODYL 10 MG RE SUPP: 10.0000 mg | Freq: Every day | RECTAL | Status: DC | PRN

## 2018-05-02 MED ORDER — ENOXAPARIN SODIUM 40 MG/0.4ML ~~LOC~~ SOLN
40.0000 mg | SUBCUTANEOUS | Status: DC
  Administered 2018-05-02 – 2018-05-18 (×17): 40 mg via SUBCUTANEOUS
  Filled 2018-05-02 (×17): qty 0.4

## 2018-05-02 MED ORDER — AMLODIPINE BESYLATE 10 MG PO TABS
10.0000 mg | ORAL_TABLET | Freq: Every day | ORAL | Status: DC
  Administered 2018-05-03 – 2018-05-19 (×17): 10 mg via ORAL
  Filled 2018-05-02 (×18): qty 1

## 2018-05-02 NOTE — ED Provider Notes (Signed)
[facility] DEPT Provider Note   CSN: [id number] Arrival date & time: 05/02/18  1056     History   Chief Complaint Chief Complaint  Patient presents with  . Abdominal Pain    HPI Amanda Hahn is a 58 y.o. female.  HPI   58 yo F with h/o pancreatitis, DM, here with ongoing abd pain. Pt was just admitted to [facility] for pancreatitis, was feeling better but sx then returned. She reports that over the past 3 days since returning home, she's had ongoing, severe pain with persistent nausea. She began vomiting today. No fever. No chills. No flank pain. Pain is aching, gnawing, severe, worse w/ palpation and eating. No alleviating factors.  Past Medical History:  Diagnosis Date  . Diabetes mellitus without complication ([place])   . History of pancreatitis   . Hypertension     Patient Active Problem List   Diagnosis Date Noted  . Pancreatitis 04/23/2018    Past Surgical History:  Procedure Laterality Date  . CESAREAN SECTION     x2     OB History   No obstetric history on file.      Home Medications    Prior to Admission medications   Medication Sig Start Date End Date Taking? Authorizing Provider  amLODipine (NORVASC) 10 MG tablet Take 1 tablet (10 mg total) by mouth daily. 04/29/18  Yes Demetrios Loll, MD  metFORMIN (GLUCOPHAGE) 500 MG tablet Take 1 tablet (500 mg total) by mouth 2 (two) times daily with a meal. 04/28/18 07/27/18 Yes Demetrios Loll, MD  metoprolol tartrate (LOPRESSOR) 25 MG tablet Take 1 tablet (25 mg total) by mouth 2 (two) times daily. 04/28/18  Yes Demetrios Loll, MD  oxyCODONE-acetaminophen (PERCOCET/ROXICET) 5-325 MG tablet Take 1 tablet by mouth every 6 (six) hours as needed for moderate pain. 04/29/18  Yes Demetrios Loll, MD  simethicone [facility]) 80 MG chewable tablet Chew 1 tablet (80 mg total) by mouth every 6 (six) hours as needed for flatulence. Patient not taking: Reported on 05/02/2018 04/29/18   Demetrios Loll, MD    Family  History Family History  Problem Relation Age of Onset  . CAD Mother     Social History Social History   Tobacco Use  . Smoking status: Current Every Day Smoker  . Smokeless tobacco: Never Used  Substance Use Topics  . Alcohol use: Yes    Comment: occasional  . Drug use: Not on file     Allergies   Patient has no known allergies.   Review of Systems Review of Systems  Constitutional: Positive for fatigue. Negative for chills and fever.  HENT: Negative for congestion and rhinorrhea.   Eyes: Negative for visual disturbance.  Respiratory: Negative for cough, shortness of breath and wheezing.   Cardiovascular: Negative for chest pain and leg swelling.  Gastrointestinal: Positive for abdominal pain, nausea and vomiting. Negative for diarrhea.  Genitourinary: Negative for dysuria and flank pain.  Musculoskeletal: Negative for neck pain and neck stiffness.  Skin: Negative for rash and wound.  Allergic/Immunologic: Negative for immunocompromised state.  Neurological: Negative for syncope, weakness and headaches.  All other systems reviewed and are negative.    Physical Exam Updated Vital Signs BP 132/81   Pulse 92   Temp 98.3 F (36.8 C) (Oral)   Resp 18   Ht 5\' 2"  (1.575 m)   Wt 78 kg   SpO2 95%   BMI 31.46 kg/m   Physical Exam Vitals signs and nursing note reviewed.  Constitutional:  General: She is not in acute distress.    Appearance: She is well-developed.  HENT:     Head: Normocephalic and atraumatic.  Eyes:     Conjunctiva/sclera: Conjunctivae normal.  Neck:     Musculoskeletal: Neck supple.  Cardiovascular:     Rate and Rhythm: Normal rate and regular rhythm.     Heart sounds: Normal heart sounds. No murmur. No friction rub.  Pulmonary:     Effort: Pulmonary effort is normal. No respiratory distress.     Breath sounds: Normal breath sounds. No wheezing or rales.  Abdominal:     General: There is no distension.     Palpations: Abdomen is soft.      Tenderness: There is abdominal tenderness in the epigastric area. There is guarding. There is no rebound.  Skin:    General: Skin is warm.     Capillary Refill: Capillary refill takes less than 2 seconds.  Neurological:     Mental Status: She is alert and oriented to person, place, and time.     Motor: No abnormal muscle tone.      ED Treatments / Results  Labs (all labs ordered are listed, but only abnormal results are displayed) Labs Reviewed  LIPASE, BLOOD - Abnormal; Notable for the following components:      Result Value   Lipase 165 (*)    All other components within normal limits  COMPREHENSIVE METABOLIC PANEL - Abnormal; Notable for the following components:   Potassium 3.2 (*)    Chloride 96 (*)    Glucose, Bld 104 (*)    Total Protein 8.8 (*)    Albumin 2.8 (*)    Alkaline Phosphatase 154 (*)    All other components within normal limits  CBC - Abnormal; Notable for the following components:   WBC 15.1 (*)    Platelets 476 (*)    All other components within normal limits  URINALYSIS, ROUTINE W REFLEX MICROSCOPIC - Abnormal; Notable for the following components:   APPearance HAZY (*)    Hgb urine dipstick SMALL (*)    Ketones, ur 20 (*)    Protein, ur 30 (*)    All other components within normal limits  CBC  COMPREHENSIVE METABOLIC PANEL    EKG None  Radiology Ct Abdomen Pelvis W Contrast  Result Date: 05/02/2018 CLINICAL DATA:  Patient was recently diagnosed with pancreatitis April 23, 2018. Continue epigastric pain. EXAM: CT ABDOMEN AND PELVIS WITH CONTRAST TECHNIQUE: Multidetector CT imaging of the abdomen and pelvis was performed using the standard protocol following bolus administration of intravenous contrast. CONTRAST:  164mL ISOVUE-300 IOPAMIDOL (ISOVUE-300) INJECTION 61% COMPARISON:  April 23, 2018 FINDINGS: Lower chest: Multiloculated mass is identified at the esophageal hiatus. This is unchanged compared prior exam. Favor pancreatic pseudocyst.  Mild atelectasis of right lung base is noted. The heart size is enlarged. Hepatobiliary: There is a small cyst at the gallbladder fossa unchanged compared prior exam. Fatty infiltration of liver is noted. The gallbladder is normal. The biliary tree is normal. 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 Calcifications are again identified in the pancreatic head. Spleen: There is a cyst in the spleen unchanged. The spleen is otherwise unremarkable. Adrenals/Urinary Tract: 2.2 cm mass in the left adrenal gland is unchanged. The right adrenal gland is normal. The bilateral kidneys demonstrate no hydronephrosis. There are small simple cysts in both kidneys. The bladder is partial decompressed without abnormality. Stomach/Bowel: There  is dilatation of the duodenum likely due to focal ileus as resolve adjacent pancreatitis and pancreatic pseudocyst. The mid to distal small bowel loops are normal in caliber. There is diverticulosis of colon without diverticulitis. The appendix is normal. Stomach is stable. Vascular/Lymphatic: Aortic atherosclerosis. No enlarged abdominal or pelvic lymph nodes. Reproductive: Partial calcified uterine fibroid is noted. The adnexa are normal. Other: None. Musculoskeletal: No acute abnormality is noted. 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 The previously noted complicated cystic mass at the esophageal hiatus is unchanged, favor pancreatic pseudocyst. Electronically Signed   By: Abelardo Diesel M.D.   On: 05/02/2018 13:38    Procedures Procedures (including critical care time)  Medications Ordered in ED Medications  sodium chloride flush (NS) 0.9 % injection 3 mL (3 mLs Intravenous Not Given 05/02/18 1410)  ondansetron (ZOFRAN) injection 4 mg (4 mg Intravenous Not Given 05/02/18 1224)  iopamidol (ISOVUE-300) 61 % injection (has no  administration in time range)  sodium chloride (PF) 0.9 % injection (has no administration in time range)  potassium chloride 10 mEq in 100 mL IVPB (10 mEq Intravenous New Bag/Given 05/02/18 1710)  amLODipine (NORVASC) tablet 10 mg (10 mg Oral Not Given 05/02/18 1830)  metoprolol tartrate (LOPRESSOR) tablet 25 mg (has no administration in time range)  enoxaparin (LOVENOX) injection 40 mg (has no administration in time range)  0.9 % NaCl with KCl 20 mEq/ L  infusion (has no administration in time range)  acetaminophen (TYLENOL) tablet 650 mg (has no administration in time range)    Or  acetaminophen (TYLENOL) suppository 650 mg (has no administration in time range)  bisacodyl (DULCOLAX) suppository 10 mg (has no administration in time range)  insulin aspart (novoLOG) injection 0-9 Units (has no administration in time range)  HYDROmorphone (DILAUDID) injection 1 mg (has no administration in time range)  ondansetron (ZOFRAN) injection 4 mg (4 mg Intravenous Given 05/02/18 1213)  HYDROmorphone (DILAUDID) injection 1 mg (1 mg Intravenous Given 05/02/18 1230)  0.9 %  sodium chloride infusion ( Intravenous New Bag/Given 05/02/18 1230)  iopamidol (ISOVUE-300) 61 % injection 100 mL (100 mLs Intravenous Contrast Given 05/02/18 1309)  HYDROmorphone (DILAUDID) injection 1 mg (1 mg Intravenous Given 05/02/18 1516)     Initial Impression / Assessment and Plan / ED Course  I have reviewed the triage vital signs and the nursing notes.  Pertinent labs & imaging results that were available during my care of the patient were reviewed by me and considered in my medical decision making (see chart for details).     58 yo F here with recurrent abd pain. Suspect ongoing, acute on chronic pancreatitis. Imaging today shows new large pseudocyst, no signs of abscess, necrosis, or hemorrhage. She has persistent severe pain in ED. Discussed case with Dr. Ardis Hughs of [facility] as well as Dr. Harlow Asa of Surgery. No acute  intervention indicated at this time, though pt certainly could have sx of obstruction 2/2 the size of her cyst. If anything, this would be an endoscopic GI or IR procedure once the cyst/fluid collection as matured. Dr. Ardis Hughs feels comfortable managing at [facility]. Currently, pt will need fluids, pain control, and nutrition.  Final Clinical Impressions(s) / ED Diagnoses   Final diagnoses:  Acute pancreatitis without infection or necrosis, unspecified pancreatitis type  Peripancreatic fluid collection    ED Discharge Orders    None       Duffy Bruce, MD 05/02/18 1930

## 2018-05-02 NOTE — ED Notes (Signed)
Bed: XF58 Expected date:  Expected time:  Means of arrival:  Comments: For triage 3

## 2018-05-02 NOTE — H&P (Addendum)
[facility] History and Physical  Laaibah Wartman EXB:[id number] DOB: [birth year] DOA: 05/02/2018  Referring physician: ED  PCP: Patient, No Pcp Per   Chief Complaint: Nausea, vomiting, abdominal pain  HPI: Amanda Hahn is a 58 y.o. female with pancreatitis, diabetes mellitus type 2, hypertension presented to hospital with complaints of nausea vomiting abdominal pain.  Of note patient was recently discharged from [facility] after treatment of pancreatitis.  She was in the hospital for at least 3 days and was discharged home almost 3 days back. Post discharge, she continued to have nausea, vomiting and severe abdominal pain.  Patient describes the pain as aching in sensation worse with eating and pressure to the abdomen.  It is severe in intensity with radiation to the back occasionally.  Patient states that that she has not eaten in several days and has poor appetite.  She had small bowel movement yesterday but is largely constipated.  Due to persistent symptoms, patient decided to come to our hospital.  Patient currently denies fever, chills or rigors.  Denies any urinary urgency, frequency or dysuria.  Denies cough, shortness of breath, fever or chills.  No chest pain or palpitation.  No dizziness lightheadedness or syncope.  ED Course: In the ED, patient had enderness over the epigastric region with some guarding.  Lipase was elevated about 165.  Potassium was low at 3.2.  Lipase elevated at 15.1.  CT scan of the abdomen repeated today showed large pseudocyst measuring around 20 x 10 cm which was completely new compared to a CT scan done 1 week back when she was admitted at [facility].  This case was discussed with Dr. Ardis Hughs of our GI as well as Dr. Tera Helper of general surgery.  No acute intervention were advised and patient was advised conservative treatment.  Patient was then considered for admission to the hospital for IV fluids pain control and  nutrition.  Review of Systems:  All systems were reviewed and were negative unless otherwise mentioned in the HPI  Past Medical History:  Diagnosis Date  . Diabetes mellitus without complication ([place])   . History of pancreatitis   . Hypertension    Past Surgical History:  Procedure Laterality Date  . CESAREAN SECTION     x2    Social History:  reports that she has been smoking. She has never used smokeless tobacco. She reports current alcohol use. No history on file for drug.  No Known Allergies  Family History  Problem Relation Age of Onset  . CAD Mother      Prior to Admission medications   Medication Sig Start Date End Date Taking? Authorizing Provider  amLODipine (NORVASC) 10 MG tablet Take 1 tablet (10 mg total) by mouth daily. 04/29/18  Yes Demetrios Loll, MD  metFORMIN (GLUCOPHAGE) 500 MG tablet Take 1 tablet (500 mg total) by mouth 2 (two) times daily with a meal. 04/28/18 07/27/18 Yes Demetrios Loll, MD  metoprolol tartrate (LOPRESSOR) 25 MG tablet Take 1 tablet (25 mg total) by mouth 2 (two) times daily. 04/28/18  Yes Demetrios Loll, MD  oxyCODONE-acetaminophen (PERCOCET/ROXICET) 5-325 MG tablet Take 1 tablet by mouth every 6 (six) hours as needed for moderate pain. 04/29/18  Yes Demetrios Loll, MD  simethicone [facility]) 80 MG chewable tablet Chew 1 tablet (80 mg total) by mouth every 6 (six) hours as needed for flatulence. Patient not taking: Reported on 05/02/2018 04/29/18   Demetrios Loll, MD    Physical Exam: Vitals:   05/02/18 1401 05/02/18  1430 05/02/18 1530 05/02/18 1619  BP:  (!) 146/88 130/84 134/86  Pulse: 86  92 91  Resp:    18  Temp:      TempSrc:      SpO2: 91%  92% 98%  Weight:      Height:       Wt Readings from Last 3 Encounters:  05/02/18 78 kg  04/29/18 78 kg   Body mass index is 31.46 kg/m.  General:  Average built, not in obvious distress HENT: Normocephalic, pupils equally reacting to light and accommodation.  No scleral pallor or icterus noted. Oral mucosa  is moist.  Chest:  Clear breath sounds.  Diminished breath sounds bilaterally. No crackles or wheezes.  CVS: S1 &S2 heard. No murmur.  Regular rate and rhythm. Abdomen: Soft, tenderness noted over the epigastric region.  Bowel sounds heard.,  Mildly distended abdomen Extremities: No cyanosis, clubbing or edema.  Peripheral pulses are palpable. Psych: Alert, awake and oriented, normal mood CNS:  No cranial nerve deficits.  Power equal in all extremities.   No cerebellar signs.   Skin: Warm and dry.  No rashes noted.  Labs on Admission:  Basic Metabolic Panel: Recent Labs  Lab 04/27/18 0308 05/02/18 1218  NA 139 136  K 3.5 3.2*  CL 111 96*  CO2 19* 25  GLUCOSE 112* 104*  BUN 11 9  CREATININE 0.60 0.62  CALCIUM 8.1* 9.0  MG 2.1  --    Liver Function Tests: Recent Labs  Lab 05/02/18 1218  AST 23  ALT 9  ALKPHOS 154*  BILITOT 1.2  PROT 8.8*  ALBUMIN 2.8*   Recent Labs  Lab 05/02/18 1218  LIPASE 165*   No results for input(s): AMMONIA in the last 168 hours. CBC: Recent Labs  Lab 04/25/18 1717 04/27/18 0308 05/02/18 1218  WBC 18.1* 12.4* 15.1*  HGB 12.1 11.0* 12.5  HCT 35.5* 32.5* 38.1  MCV 82.0 81.7 82.1  PLT 295 309 476*   Cardiac Enzymes: No results for input(s): CKTOTAL, CKMB, CKMBINDEX, TROPONINI in the last 168 hours.  BNP (last 3 results) No results for input(s): BNP in the last 8760 hours.  ProBNP (last 3 results) No results for input(s): PROBNP in the last 8760 hours.  CBG: Recent Labs  Lab 04/28/18 2337 04/29/18 0544 04/29/18 1133 04/29/18 1214 04/29/18 1651  GLUCAP 117* 139* 131* 134* 127*     Radiological Exams on Admission: Ct Abdomen Pelvis W Contrast  Result Date: 05/02/2018 CLINICAL DATA:  Patient was recently diagnosed with pancreatitis April 23, 2018. Continue epigastric pain. EXAM: CT ABDOMEN AND PELVIS WITH CONTRAST TECHNIQUE: Multidetector CT imaging of the abdomen and pelvis was performed using the standard protocol  following bolus administration of intravenous contrast. CONTRAST:  177mL ISOVUE-300 IOPAMIDOL (ISOVUE-300) INJECTION 61% COMPARISON:  April 23, 2018 FINDINGS: Lower chest: Multiloculated mass is identified at the esophageal hiatus. This is unchanged compared prior exam. Favor pancreatic pseudocyst. Mild atelectasis of right lung base is noted. The heart size is enlarged. Hepatobiliary: There is a small cyst at the gallbladder fossa unchanged compared prior exam. Fatty infiltration of liver is noted. The gallbladder is normal. The biliary tree is normal. 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 Calcifications are again identified in the pancreatic head. Spleen: There is a cyst in the spleen unchanged. The spleen is otherwise unremarkable. Adrenals/Urinary Tract: 2.2 cm mass in the left adrenal gland is unchanged. The right adrenal  gland is normal. The bilateral kidneys demonstrate no hydronephrosis. There are small simple cysts in both kidneys. The bladder is partial decompressed without abnormality. Stomach/Bowel: There is dilatation of the duodenum likely due to focal ileus as resolve adjacent pancreatitis and pancreatic pseudocyst. The mid to distal small bowel loops are normal in caliber. There is diverticulosis of colon without diverticulitis. The appendix is normal. Stomach is stable. Vascular/Lymphatic: Aortic atherosclerosis. No enlarged abdominal or pelvic lymph nodes. Reproductive: Partial calcified uterine fibroid is noted. The adnexa are normal. Other: None. Musculoskeletal: No acute abnormality is noted. 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 The previously noted complicated cystic mass at the esophageal hiatus is unchanged, favor pancreatic pseudocyst. Electronically Signed   By: Abelardo Diesel M.D.   On: 05/02/2018 13:38   EKG: Not available for  review   Assessment/Plan Principal Problem:   Pancreatitis  Acute on likely chronic pancreatitis with large cystic fluid collection.  Multiloculated mass at the esophageal hiatus.  Likely pseudocyst.  GI and surgery on board.  Recommend conservative treatment with  IV fluids pain control.  We will keep the patient n.p.o. except meds.  Mildly elevated lipase as well.  On IV fluid hydration.  Prior history of alcohol abuse but currently does not drink..  Patient has mild leukocytosis but no fever.  Diabetes mellitus type 2.  Patient is on metformin at home.  Will hold.  Continue sliding scale insulin.  N.p.o. we will continue with IV fluids.  Leukocytosis but no fever at this time.  Will closely monitor.  Hypokalemia.  We will continue replacement with IV fluids  Hypertension.  Patient is on amlodipine metoprolol at home.  Will resume.  Consultant: Dr. Marylene Buerger GI, Dr. Harlow Asa general surgery  Code Status: Full code  DVT Prophylaxis: Lovenox  Antibiotics: None at this time but if patient develops fever could consider antibiotic given the large cystic fluid collection and possibility of infection  Family Communication:  Patients' condition and plan of care including tests being ordered have been discussed with the patient and patient's family who indicate understanding and agree with the plan.  Disposition Plan: Home  Severity of Illness: The appropriate patient status for this patient is INPATIENT. Inpatient status is judged to be reasonable and necessary in order to provide the required intensity of service to ensure the patient's safety. The patient's presenting symptoms, physical exam findings, and initial radiographic and laboratory data in the context of their chronic comorbidities is felt to place them at high risk for further clinical deterioration. Furthermore, it is not anticipated that the patient will be medically stable for discharge from the hospital within 2 midnights of  admission. I certify that at the point of admission it is my clinical judgment that the patient will require inpatient hospital care spanning beyond 2 midnights from the point of admission due to high intensity of service, high risk for further deterioration and high frequency of surveillance required.   Signed, Flora Lipps, MD [facility] 05/02/2018

## 2018-05-02 NOTE — ED Triage Notes (Signed)
Pt states that she was recently diagnosed with pancreatitis on 04/23/18. Pt reports that since she continues to have epigastric pain 10/10 and ongoing nausea and vomiting and associated diarrhea. Pt states that she has not been able to sleep d/t symptoms. Pt accompanied with son in law.

## 2018-05-02 NOTE — ED Notes (Signed)
Pt urine sample in room

## 2018-05-03 DIAGNOSIS — K859 Acute pancreatitis without necrosis or infection, unspecified: Principal | ICD-10-CM

## 2018-05-03 LAB — CBC
HCT: 35.3 % — ABNORMAL LOW (ref 36.0–46.0)
Hemoglobin: 11.5 g/dL — ABNORMAL LOW (ref 12.0–15.0)
MCH: 27.4 pg (ref 26.0–34.0)
MCHC: 32.6 g/dL (ref 30.0–36.0)
MCV: 84.2 fL (ref 80.0–100.0)
PLATELETS: 445 10*3/uL — AB (ref 150–400)
RBC: 4.19 MIL/uL (ref 3.87–5.11)
RDW: 15 % (ref 11.5–15.5)
WBC: 17.2 10*3/uL — ABNORMAL HIGH (ref 4.0–10.5)
nRBC: 0 % (ref 0.0–0.2)

## 2018-05-03 LAB — GLUCOSE, CAPILLARY
GLUCOSE-CAPILLARY: 78 mg/dL (ref 70–99)
GLUCOSE-CAPILLARY: 95 mg/dL (ref 70–99)
Glucose-Capillary: 107 mg/dL — ABNORMAL HIGH (ref 70–99)
Glucose-Capillary: 73 mg/dL (ref 70–99)
Glucose-Capillary: 77 mg/dL (ref 70–99)

## 2018-05-03 LAB — COMPREHENSIVE METABOLIC PANEL
ALT: 9 U/L (ref 0–44)
AST: 19 U/L (ref 15–41)
Albumin: 2.5 g/dL — ABNORMAL LOW (ref 3.5–5.0)
Alkaline Phosphatase: 122 U/L (ref 38–126)
Anion gap: 16 — ABNORMAL HIGH (ref 5–15)
BUN: 8 mg/dL (ref 6–20)
CO2: 21 mmol/L — ABNORMAL LOW (ref 22–32)
Calcium: 8.3 mg/dL — ABNORMAL LOW (ref 8.9–10.3)
Chloride: 102 mmol/L (ref 98–111)
Creatinine, Ser: 0.71 mg/dL (ref 0.44–1.00)
GFR calc Af Amer: >60 mL/min (ref >60)
GFR calc non Af Amer: >60 mL/min (ref >60)
Glucose, Bld: 75 mg/dL (ref 70–99)
Potassium: 3.9 mmol/L (ref 3.5–5.1)
Sodium: 139 mmol/L (ref 135–145)
Total Bilirubin: 1.2 mg/dL (ref 0.3–1.2)
Total Protein: 7.1 g/dL (ref 6.5–8.1)

## 2018-05-03 MED ORDER — ENSURE ENLIVE PO LIQD
237.0000 mL | Freq: Three times a day (TID) | ORAL | Status: DC
  Administered 2018-05-04 – 2018-05-06 (×4): 237 mL via ORAL

## 2018-05-03 MED ORDER — LIP MEDEX EX OINT
TOPICAL_OINTMENT | CUTANEOUS | Status: AC
  Administered 2018-05-03: 12:00:00
  Filled 2018-05-03: qty 7

## 2018-05-03 MED ORDER — KETOROLAC TROMETHAMINE 30 MG/ML IJ SOLN
30.0000 mg | Freq: Once | INTRAMUSCULAR | Status: AC
  Administered 2018-05-04: 30 mg via INTRAVENOUS
  Filled 2018-05-03: qty 1

## 2018-05-03 MED ORDER — FENTANYL CITRATE (PF) 100 MCG/2ML IJ SOLN
25.0000 ug | Freq: Once | INTRAMUSCULAR | Status: AC
  Administered 2018-05-03: 25 ug via INTRAVENOUS
  Filled 2018-05-03: qty 2

## 2018-05-03 MED ORDER — ONDANSETRON HCL 4 MG/2ML IJ SOLN
4.0000 mg | Freq: Four times a day (QID) | INTRAMUSCULAR | Status: DC | PRN
  Administered 2018-05-04 – 2018-05-06 (×5): 4 mg via INTRAVENOUS
  Filled 2018-05-03 (×6): qty 2

## 2018-05-03 NOTE — Consult Note (Addendum)
Referring Provider:  Dr. Earlie Counts, [facility] Primary Care Physician:  Patient, No Pcp Per Primary Gastroenterologist:  Althia Forts, recently seen by Ebbie Ridge during her recent hospitalization there but was new to them as well  Reason for Consultation:  Pancreatitis with enlarging fluid collection   HPI: Amanda Hahn is a 58 y.o. female with ETOH pancreatitis, diabetes mellitus type 2, and hypertension who presented to hospital with complaints of nausea, vomiting, and abdominal pain.  Of note patient was recently discharged from [facility] after treatment of pancreatitis.  She was in the hospital from 1/9 to 1/15 and was discharged home.  three days later she presented back here to [facility] ED as post discharge, she continued to have nausea, vomiting, and severe abdominal pain.  Patient describes the pain as aching in sensation, worse with eating and pressure to the abdomen.  It is severe in intensity with radiation to the back occasionally.  Patient states that that she has not eaten in several days and has poor appetite.  Due to persistent symptoms, patient decided to come to our hospital.  Patient currently denies fever and chills.  ED Course:  Lipase was elevated about 165.  Potassium was low at 3.2.  WBC count elevated at 15.1.  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 The previously noted complicated cystic mass at the esophageal hiatus is unchanged, favor pancreatic pseudocyst.  CT scan of the abdomen and pelvis with contrast at [facility] on 04/23/2018 showed the following:  IMPRESSION: 1. Acute on chronic pancreatitis. Areas of main duct dilatation is likely related to the chronic pancreatitis, but consider elective MR follow-up. 2. Multiloculated ~ 6 cm mass in the lower mediastinum extending through the esophageal hiatus. A pseudocyst is  favored. Clinical history makes subacute esophageal rupture or abscess unlikely. This would be an unusual appearance for neoplasm. Recommend GI consultation for EGD/EUS consideration. 3. 2.2 cm left adrenal nodule, statistically an adenoma. Attention on follow-up. 4. Extensive colonic diverticulosis. 5. Fibroid uterus. 6. Hepatic steatosis and atherosclerosis. 7. Fatty midline hernia. 8. Cholelithiasis.  Today she is feeling a little better.  Drinking very small amount of clear liquids this AM.  Reports first episode of pancreatitis was in 2011 or 2012.  Says that she was drinking more heavily back then.  This is her second bout with pancreatitis, but says that she only drinks a few times a year now.  Her last ETOH was Christmas Eve when she had 2 shots of ETOH.   Past Medical History:  Diagnosis Date  . Diabetes mellitus without complication ([place])   . History of pancreatitis   . Hypertension     Past Surgical History:  Procedure Laterality Date  . CESAREAN SECTION     x2    Prior to Admission medications   Medication Sig Start Date End Date Taking? Authorizing Provider  amLODipine (NORVASC) 10 MG tablet Take 1 tablet (10 mg total) by mouth daily. 04/29/18  Yes Demetrios Loll, MD  metFORMIN (GLUCOPHAGE) 500 MG tablet Take 1 tablet (500 mg total) by mouth 2 (two) times daily with a meal. 04/28/18 07/27/18 Yes Demetrios Loll, MD  metoprolol tartrate (LOPRESSOR) 25 MG tablet Take 1 tablet (25 mg total) by mouth 2 (two) times daily. 04/28/18  Yes Demetrios Loll, MD  oxyCODONE-acetaminophen (PERCOCET/ROXICET) 5-325 MG tablet Take 1 tablet by mouth every 6 (six) hours as needed for  moderate pain. 04/29/18  Yes Demetrios Loll, MD  simethicone [facility]) 80 MG chewable tablet Chew 1 tablet (80 mg total) by mouth every 6 (six) hours as needed for flatulence. Patient not taking: Reported on 05/02/2018 04/29/18   Demetrios Loll, MD    Current Facility-Administered Medications  Medication Dose Route Frequency  Provider Last Rate Last Dose  . 0.9 % NaCl with KCl 20 mEq/ L  infusion   Intravenous Continuous Pokhrel, Laxman, MD 100 mL/hr at 05/03/18 0725    . acetaminophen (TYLENOL) tablet 650 mg  650 mg Oral Q6H PRN Pokhrel, Laxman, MD       Or  . acetaminophen (TYLENOL) suppository 650 mg  650 mg Rectal Q6H PRN Pokhrel, Laxman, MD      . amLODipine (NORVASC) tablet 10 mg  10 mg Oral Daily Pokhrel, Laxman, MD   10 mg at 05/03/18 0949  . bisacodyl (DULCOLAX) suppository 10 mg  10 mg Rectal Daily PRN Pokhrel, Laxman, MD      . enoxaparin (LOVENOX) injection 40 mg  40 mg Subcutaneous Q24H Pokhrel, Laxman, MD   40 mg at 05/02/18 2017  . HYDROmorphone (DILAUDID) injection 1 mg  1 mg Intravenous Q3H PRN Pokhrel, Laxman, MD   1 mg at 05/03/18 0728  . insulin aspart (novoLOG) injection 0-9 Units  0-9 Units Subcutaneous Q6H Pokhrel, Laxman, MD      . metoprolol tartrate (LOPRESSOR) tablet 25 mg  25 mg Oral BID Pokhrel, Laxman, MD   25 mg at 05/03/18 0949  . ondansetron (ZOFRAN) injection 4 mg  4 mg Intravenous Once Pokhrel, Laxman, MD      . sodium chloride flush (NS) 0.9 % injection 3 mL  3 mL Intravenous Once Pokhrel, Laxman, MD        Allergies as of 05/02/2018  . (No Known Allergies)    Family History  Problem Relation Age of Onset  . CAD Mother     Social History   Socioeconomic History  . Marital status: Single    Spouse name: Not on file  . Number of children: Not on file  . Years of education: Not on file  . Highest education level: Not on file  Occupational History  . Not on file  Social Needs  . Financial resource strain: Not on file  . Food insecurity:    Worry: Not on file    Inability: Not on file  . Transportation needs:    Medical: Not on file    Non-medical: Not on file  Tobacco Use  . Smoking status: Current Every Day Smoker  . Smokeless tobacco: Never Used  Substance and Sexual Activity  . Alcohol use: Yes    Comment: occasional  . Drug use: Not on file  . Sexual  activity: Not Currently  Lifestyle  . Physical activity:    Days per week: Not on file    Minutes per session: Not on file  . Stress: Not on file  Relationships  . Social connections:    Talks on phone: Not on file    Gets together: Not on file    Attends religious service: Not on file    Active member of club or organization: Not on file    Attends meetings of clubs or organizations: Not on file    Relationship status: Not on file  . Intimate partner violence:    Fear of current or ex partner: Not on file    Emotionally abused: Not on file    Physically abused:  Not on file    Forced sexual activity: Not on file  Other Topics Concern  . Not on file  Social History Narrative  . Not on file    Review of Systems: ROS is O/W negative except as mentioned in HPI.  Physical Exam: Vital signs in last 24 hours: Temp:  [98.2 F (36.8 C)-98.4 F (36.9 C)] 98.2 F (36.8 C) (01/19 0604) Pulse Rate:  [85-94] 86 (01/19 0604) Resp:  [16-18] 16 (01/19 0604) BP: (130-146)/(72-94) 137/86 (01/19 0604) SpO2:  [91 %-98 %] 96 % (01/19 0604) Weight:  [78 kg] 78 kg (01/18 1138) Last BM Date: 05/01/18 General:  Alert, Well-developed, well-nourished, pleasant and cooperative in NAD Head:  Normocephalic and atraumatic. Eyes:  Sclera clear, no icterus.  Conjunctiva pink. Ears:  Normal auditory acuity. Mouth:  No deformity or lesions.  Poor dentition.   Lungs:  Clear throughout to auscultation.  No wheezes, crackles, or rhonchi.  Heart:  Regular rate and rhythm; no murmurs, clicks, rubs, or gallops. Abdomen:  Soft, non-distended.  BS present.  Fullness in upper abdomen.  Minimal upper abdominal TTP.  Msk:  Symmetrical without gross deformities. Pulses:  Normal pulses noted. Extremities:  Without clubbing or edema. Neurologic:  Alert and oriented x 4;  grossly normal neurologically. Skin:  Intact without significant lesions or rashes. Psych:  Alert and cooperative. Normal mood and  affect.  Intake/Output from previous day: 01/18 0701 - 01/19 0700 In: 862.9 [I.V.:862.9] Out: -   Lab Results: Recent Labs    05/02/18 1218 05/03/18 0501  WBC 15.1* 17.2*  HGB 12.5 11.5*  HCT 38.1 35.3*  PLT 476* 445*   BMET Recent Labs    05/02/18 1218 05/03/18 0501  NA 136 139  K 3.2* 3.9  CL 96* 102  CO2 25 21*  GLUCOSE 104* 75  BUN 9 8  CREATININE 0.62 0.71  CALCIUM 9.0 8.3*   LFT Recent Labs    05/03/18 0501  PROT 7.1  ALBUMIN 2.5*  AST 19  ALT 9  ALKPHOS 122  BILITOT 1.2   Studies/Results: Ct Abdomen Pelvis W Contrast  Result Date: 05/02/2018 CLINICAL DATA:  Patient was recently diagnosed with pancreatitis April 23, 2018. Continue epigastric pain. EXAM: CT ABDOMEN AND PELVIS WITH CONTRAST TECHNIQUE: Multidetector CT imaging of the abdomen and pelvis was performed using the standard protocol following bolus administration of intravenous contrast. CONTRAST:  162mL ISOVUE-300 IOPAMIDOL (ISOVUE-300) INJECTION 61% COMPARISON:  April 23, 2018 FINDINGS: Lower chest: Multiloculated mass is identified at the esophageal hiatus. This is unchanged compared prior exam. Favor pancreatic pseudocyst. Mild atelectasis of right lung base is noted. The heart size is enlarged. Hepatobiliary: There is a small cyst at the gallbladder fossa unchanged compared prior exam. Fatty infiltration of liver is noted. The gallbladder is normal. The biliary tree is normal. 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 Calcifications are again identified in the pancreatic head. Spleen: There is a cyst in the spleen unchanged. The spleen is otherwise unremarkable. Adrenals/Urinary Tract: 2.2 cm mass in the left adrenal gland is unchanged. The right adrenal gland is normal. The bilateral kidneys demonstrate no hydronephrosis. There are small simple cysts in both kidneys. The bladder is partial decompressed without  abnormality. Stomach/Bowel: There is dilatation of the duodenum likely due to focal ileus as resolve adjacent pancreatitis and pancreatic pseudocyst. The mid to distal small bowel loops are normal in caliber. There is diverticulosis of colon without  diverticulitis. The appendix is normal. Stomach is stable. Vascular/Lymphatic: Aortic atherosclerosis. No enlarged abdominal or pelvic lymph nodes. Reproductive: Partial calcified uterine fibroid is noted. The adnexa are normal. Other: None. Musculoskeletal: No acute abnormality is noted. 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 The previously noted complicated cystic mass at the esophageal hiatus is unchanged, favor pancreatic pseudocyst. Electronically Signed   By: Abelardo Diesel M.D.   On: 05/02/2018 13:38   IMPRESSION:  -Acute on chronic ETOH pancreatitis with large fluid collection measuring 20 x 10 cm.  Not technically a "pseudocyst" at this point and too immature to drain.  PLAN: -For now the goal is pain control and antiemetics that can be scheduled ATC if needed. -Clear liquid diet and advance to full liquids is the goal as long as she can tolerate adequate amounts of full liquids then could maintain on Ensure, etc, for the next several weeks.  If unable then may need post-pyloric feeding tube. -Otherwise would plan on re-imaging in several weeks to reassess size of fluid collection and see about draining if needed. -Needs total ETOH cessation/abstinence.   Laban Emperor. Zehr  05/03/2018, 9:51 AM   ________________________________________________________________________  Velora Heckler GI MD note:  I personally examined the patient, reviewed the data and agree with the assessment and plan described above.    First she has gallstones in her GB and so her recent acute pancreatitis certainly may have been 'biliary' in origin. As such she should eventually be considered for GB resection. She  only drank 2 shots of etoh prior to the acute pancreatitis.    Second, the acute pancreatic fluid collection is quite large and causes mass affect on her GI tract. It may take weeks for the fluid to organize/mature well enough so that it would be amenable and safe for drainage (ideally transgastric drainage).  In the meantime it is most important that she be able to maintain adequate nutrition.  She tolerated clear liquids and hopefully will tolerate further advancement. I will change to full liquids now and will add ensure TID.  If she cannot maintain adequate nutrition orally, will have to consider post pyloric nasal feeding tube which would probably need to remain in place for weeks.  Will follow along. If she tolerates nutritious liquid diet and her pain is controlled she will be safe for d/c and can follow up with her [facility] GI doctors (or with me if she prefers).     Owens Loffler, MD [facility] Pager [phone]

## 2018-05-03 NOTE — Progress Notes (Signed)
PROGRESS NOTE    Amanda Hahn  IZT:[id number] DOB: 19-Mar-1961 DOA: 05/02/2018 PCP: Patient, No Pcp Per    Brief Narrative:  58 y.o. female with pancreatitis, diabetes mellitus type 2, hypertension presented to hospital with complaints of nausea vomiting abdominal pain.  Of note patient was recently discharged from [facility] after treatment of pancreatitis.  She was in the hospital for at least 3 days and was discharged home almost 3 days back. Post discharge, she continued to have nausea, vomiting and severe abdominal pain.  Patient describes the pain as aching in sensation worse with eating and pressure to the abdomen.  It is severe in intensity with radiation to the back occasionally.  Patient states that that she has not eaten in several days and has poor appetite.  She had small bowel movement yesterday but is largely constipated.  Due to persistent symptoms, patient decided to come to our hospital.  Patient currently denies fever, chills or rigors.  Denies any urinary urgency, frequency or dysuria.  Denies cough, shortness of breath, fever or chills.  No chest pain or palpitation.  No dizziness lightheadedness or syncope.  ED Course: In the ED, patient had enderness over the epigastric region with some guarding.  Lipase was elevated about 165.  Potassium was low at 3.2.  Lipase elevated at 15.1.  CT scan of the abdomen repeated today showed large pseudocyst measuring around 20 x 10 cm which was completely new compared to a CT scan done 1 week back when she was admitted at [facility].  This case was discussed with Dr. Ardis Hughs of our GI as well as Dr. Tera Helper of general surgery.  No acute intervention were advised and patient was advised conservative treatment.  Patient was then considered for admission to the hospital for IV fluids pain control and nutrition.   Assessment & Plan:   Principal Problem:   Pancreatitis  Acute on likely chronic pancreatitis with  large cystic fluid collection.  Multiloculated mass at the esophageal hiatus concerns for large pseudocyst.  - GI following. No indication for drainage at this point in time. Recommendation for re-imaging several weeks later to decide if drain warranted then - Pt reports feeling better today - Will start clears. Continue per GI recs  Diabetes mellitus type 2.  Patient is on metformin at home.  Will hold while in hospital.. Continue sliding scale insulin.  Leukocytosis.  no fever at this time.  Will closely monitor. Stable at present  Hypokalemia.  Replaced  Hypertension.  Pt continued on amlodipine metoprolol at home.  Will resume.  DVT prophylaxis: Lovenox subQ Code Status: Full Family Communication: Pt in room, family not at bedside Disposition Plan: Uncertain at this time  Consultants:   GI  Procedures:     Antimicrobials: Anti-infectives (From admission, onward)   None       Subjective: Feeling better. Wants to start eating  Objective: Vitals:   05/02/18 1700 05/02/18 2106 05/03/18 0604 05/03/18 1321  BP: 132/81 133/81 137/86 140/87  Pulse: 92 91 86 80  Resp:  18 16 16   Temp:  98.4 F (36.9 C) 98.2 F (36.8 C) 99 F (37.2 C)  TempSrc:  Oral Oral Oral  SpO2: 95% 96% 96% 99%  Weight:      Height:        Intake/Output Summary (Last 24 hours) at 05/03/2018 1704 Last data filed at 05/03/2018 1600 Gross per 24 hour  Intake 1860.06 ml  Output -  Net 1860.06 ml  Filed Weights   05/02/18 1138  Weight: 78 kg    Examination:  General exam: Appears calm and comfortable  Respiratory system: Clear to auscultation. Respiratory effort normal. Cardiovascular system: S1 & S2 heard, RRR Gastrointestinal system: Abdomen is nondistended, generally tender. Normal bowel sounds heard. Central nervous system: Alert and oriented. No focal neurological deficits. Extremities: Symmetric 5 x 5 power. Skin: No rashes, lesions  Psychiatry: Judgement and insight appear  normal. Mood & affect appropriate.   Data Reviewed: I have personally reviewed following labs and imaging studies  CBC: Recent Labs  Lab 04/27/18 0308 05/02/18 1218 05/03/18 0501  WBC 12.4* 15.1* 17.2*  HGB 11.0* 12.5 11.5*  HCT 32.5* 38.1 35.3*  MCV 81.7 82.1 84.2  PLT 309 476* 185*   Basic Metabolic Panel: Recent Labs  Lab 04/27/18 0308 05/02/18 1218 05/03/18 0501  NA 139 136 139  K 3.5 3.2* 3.9  CL 111 96* 102  CO2 19* 25 21*  GLUCOSE 112* 104* 75  BUN 11 9 8   CREATININE 0.60 0.62 0.71  CALCIUM 8.1* 9.0 8.3*  MG 2.1  --   --    GFR: Estimated Creatinine Clearance: 73.3 mL/min (by C-G formula based on SCr of 0.71 mg/dL). Liver Function Tests: Recent Labs  Lab 05/02/18 1218 05/03/18 0501  AST 23 19  ALT 9 9  ALKPHOS 154* 122  BILITOT 1.2 1.2  PROT 8.8* 7.1  ALBUMIN 2.8* 2.5*   Recent Labs  Lab 05/02/18 1218  LIPASE 165*   No results for input(s): AMMONIA in the last 168 hours. Coagulation Profile: No results for input(s): INR, PROTIME in the last 168 hours. Cardiac Enzymes: No results for input(s): CKTOTAL, CKMB, CKMBINDEX, TROPONINI in the last 168 hours. BNP (last 3 results) No results for input(s): PROBNP in the last 8760 hours. HbA1C: No results for input(s): HGBA1C in the last 72 hours. CBG: Recent Labs  Lab 04/29/18 1651 05/02/18 1952 05/03/18 0014 05/03/18 0445 05/03/18 1142  GLUCAP 127* 84 78 77 73   Lipid Profile: No results for input(s): CHOL, HDL, LDLCALC, TRIG, CHOLHDL, LDLDIRECT in the last 72 hours. Thyroid Function Tests: No results for input(s): TSH, T4TOTAL, FREET4, T3FREE, THYROIDAB in the last 72 hours. Anemia Panel: No results for input(s): VITAMINB12, FOLATE, FERRITIN, TIBC, IRON, RETICCTPCT in the last 72 hours. Sepsis Labs: No results for input(s): PROCALCITON, LATICACIDVEN in the last 168 hours.  No results found for this or any previous visit (from the past 240 hour(s)).   Radiology Studies: Ct Abdomen Pelvis  W Contrast  Result Date: 05/02/2018 CLINICAL DATA:  Patient was recently diagnosed with pancreatitis April 23, 2018. Continue epigastric pain. EXAM: CT ABDOMEN AND PELVIS WITH CONTRAST TECHNIQUE: Multidetector CT imaging of the abdomen and pelvis was performed using the standard protocol following bolus administration of intravenous contrast. CONTRAST:  144mL ISOVUE-300 IOPAMIDOL (ISOVUE-300) INJECTION 61% COMPARISON:  April 23, 2018 FINDINGS: Lower chest: Multiloculated mass is identified at the esophageal hiatus. This is unchanged compared prior exam. Favor pancreatic pseudocyst. Mild atelectasis of right lung base is noted. The heart size is enlarged. Hepatobiliary: There is a small cyst at the gallbladder fossa unchanged compared prior exam. Fatty infiltration of liver is noted. The gallbladder is normal. The biliary tree is normal. 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 Calcifications are again identified in the pancreatic head. Spleen: There is a cyst in the spleen unchanged. The spleen is otherwise unremarkable. Adrenals/Urinary Tract:  2.2 cm mass in the left adrenal gland is unchanged. The right adrenal gland is normal. The bilateral kidneys demonstrate no hydronephrosis. There are small simple cysts in both kidneys. The bladder is partial decompressed without abnormality. Stomach/Bowel: There is dilatation of the duodenum likely due to focal ileus as resolve adjacent pancreatitis and pancreatic pseudocyst. The mid to distal small bowel loops are normal in caliber. There is diverticulosis of colon without diverticulitis. The appendix is normal. Stomach is stable. Vascular/Lymphatic: Aortic atherosclerosis. No enlarged abdominal or pelvic lymph nodes. Reproductive: Partial calcified uterine fibroid is noted. The adnexa are normal. Other: None. Musculoskeletal: No acute abnormality is noted. 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 The previously noted complicated cystic mass at the esophageal hiatus is unchanged, favor pancreatic pseudocyst. Electronically Signed   By: Abelardo Diesel M.D.   On: 05/02/2018 13:38    Scheduled Meds: . amLODipine  10 mg Oral Daily  . enoxaparin (LOVENOX) injection  40 mg Subcutaneous Q24H  . feeding supplement (ENSURE ENLIVE)  237 mL Oral TID WC  . insulin aspart  0-9 Units Subcutaneous Q6H  . lip balm      . metoprolol tartrate  25 mg Oral BID  . sodium chloride flush  3 mL Intravenous Once   Continuous Infusions: . 0.9 % NaCl with KCl 20 mEq / L 100 mL/hr at 05/03/18 0725     LOS: 1 day   Marylu Lund, MD [facility] Pager On Amion  If 7PM-7AM, please contact night-coverage 05/03/2018, 5:04 PM

## 2018-05-04 DIAGNOSIS — K852 Alcohol induced acute pancreatitis without necrosis or infection: Secondary | ICD-10-CM

## 2018-05-04 DIAGNOSIS — K863 Pseudocyst of pancreas: Secondary | ICD-10-CM

## 2018-05-04 DIAGNOSIS — E46 Unspecified protein-calorie malnutrition: Secondary | ICD-10-CM

## 2018-05-04 DIAGNOSIS — R1084 Generalized abdominal pain: Secondary | ICD-10-CM

## 2018-05-04 LAB — COMPREHENSIVE METABOLIC PANEL
ALT: 7 U/L (ref 0–44)
ANION GAP: 12 (ref 5–15)
AST: 19 U/L (ref 15–41)
Albumin: 2.3 g/dL — ABNORMAL LOW (ref 3.5–5.0)
Alkaline Phosphatase: 117 U/L (ref 38–126)
BUN: 5 mg/dL — ABNORMAL LOW (ref 6–20)
CALCIUM: 8.4 mg/dL — AB (ref 8.9–10.3)
CO2: 21 mmol/L — ABNORMAL LOW (ref 22–32)
Chloride: 104 mmol/L (ref 98–111)
Creatinine, Ser: 0.58 mg/dL (ref 0.44–1.00)
Glucose, Bld: 113 mg/dL — ABNORMAL HIGH (ref 70–99)
Potassium: 4 mmol/L (ref 3.5–5.1)
Sodium: 137 mmol/L (ref 135–145)
Total Bilirubin: 0.9 mg/dL (ref 0.3–1.2)
Total Protein: 6.8 g/dL (ref 6.5–8.1)

## 2018-05-04 LAB — GLUCOSE, CAPILLARY
GLUCOSE-CAPILLARY: 97 mg/dL (ref 70–99)
Glucose-Capillary: 117 mg/dL — ABNORMAL HIGH (ref 70–99)
Glucose-Capillary: 135 mg/dL — ABNORMAL HIGH (ref 70–99)

## 2018-05-04 LAB — LIPASE, BLOOD: Lipase: 203 U/L — ABNORMAL HIGH (ref 11–51)

## 2018-05-04 MED ORDER — KETOROLAC TROMETHAMINE 30 MG/ML IJ SOLN
30.0000 mg | Freq: Four times a day (QID) | INTRAMUSCULAR | Status: AC | PRN
  Administered 2018-05-04 – 2018-05-08 (×11): 30 mg via INTRAVENOUS
  Filled 2018-05-04 (×12): qty 1

## 2018-05-04 MED ORDER — HYDROMORPHONE HCL 1 MG/ML IJ SOLN
0.5000 mg | Freq: Once | INTRAMUSCULAR | Status: AC
  Administered 2018-05-04: 0.5 mg via INTRAVENOUS

## 2018-05-04 MED ORDER — KETOROLAC TROMETHAMINE 30 MG/ML IJ SOLN
30.0000 mg | Freq: Once | INTRAMUSCULAR | Status: AC
  Administered 2018-05-04: 30 mg via INTRAVENOUS
  Filled 2018-05-04: qty 1

## 2018-05-04 NOTE — Progress Notes (Addendum)
Progress Note   Subjective  Chief Complaint: Pancreatitis with enlarging fluid collection  Patient describes the pain level has decreased today.  She does continue with nausea, specifically after eating.  Was only able to tolerate 4 tbsp of a full liquid diet last night and this morning, after this becomes nauseous.   Objective   Vital signs in last 24 hours: Temp:  [98.4 F (36.9 C)-99 F (37.2 C)] 98.4 F (36.9 C) (01/20 0553) Pulse Rate:  [80-84] 83 (01/20 0553) Resp:  [16-17] 17 (01/20 0553) BP: (131-140)/(84-87) 139/84 (01/20 0553) SpO2:  [94 %-99 %] 94 % (01/20 0553) Last BM Date: 04/30/18 General:    AA female in NAD Heart:  Regular rate and rhythm; no murmurs Lungs: Respirations even and unlabored, lungs CTA bilaterally Abdomen:  Soft, mild epigastric ttp and mild distension Normal bowel sounds. Extremities:  Without edema. Neurologic:  Alert and oriented,  grossly normal neurologically. Psych:  Cooperative. Normal mood and affect.  Intake/Output from previous day: 01/19 0701 - 01/20 0700 In: 2297.2 [I.V.:2297.2] Out: -  Intake/Output this shift: Total I/O In: 240 [P.O.:240] Out: -   Lab Results: Recent Labs    05/02/18 1218 05/03/18 0501  WBC 15.1* 17.2*  HGB 12.5 11.5*  HCT 38.1 35.3*  PLT 476* 445*   BMET Recent Labs    05/02/18 1218 05/03/18 0501 05/04/18 0449  NA 136 139 137  K 3.2* 3.9 4.0  CL 96* 102 104  CO2 25 21* 21*  GLUCOSE 104* 75 113*  BUN 9 8 5*  CREATININE 0.62 0.71 0.58  CALCIUM 9.0 8.3* 8.4*   LFT Recent Labs    05/04/18 0449  PROT 6.8  ALBUMIN 2.3*  AST 19  ALT 7  ALKPHOS 117  BILITOT 0.9    Studies/Results: Ct Abdomen Pelvis W Contrast  Result Date: 05/02/2018 CLINICAL DATA:  Patient was recently diagnosed with pancreatitis April 23, 2018. Continue epigastric pain. EXAM: CT ABDOMEN AND PELVIS WITH CONTRAST TECHNIQUE: Multidetector CT imaging of the abdomen and pelvis was performed using the standard protocol  following bolus administration of intravenous contrast. CONTRAST:  168mL ISOVUE-300 IOPAMIDOL (ISOVUE-300) INJECTION 61% COMPARISON:  April 23, 2018 FINDINGS: Lower chest: Multiloculated mass is identified at the esophageal hiatus. This is unchanged compared prior exam. Favor pancreatic pseudocyst. Mild atelectasis of right lung base is noted. The heart size is enlarged. Hepatobiliary: There is a small cyst at the gallbladder fossa unchanged compared prior exam. Fatty infiltration of liver is noted. The gallbladder is normal. The biliary tree is normal. 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 Calcifications are again identified in the pancreatic head. Spleen: There is a cyst in the spleen unchanged. The spleen is otherwise unremarkable. Adrenals/Urinary Tract: 2.2 cm mass in the left adrenal gland is unchanged. The right adrenal gland is normal. The bilateral kidneys demonstrate no hydronephrosis. There are small simple cysts in both kidneys. The bladder is partial decompressed without abnormality. Stomach/Bowel: There is dilatation of the duodenum likely due to focal ileus as resolve adjacent pancreatitis and pancreatic pseudocyst. The mid to distal small bowel loops are normal in caliber. There is diverticulosis of colon without diverticulitis. The appendix is normal. Stomach is stable. Vascular/Lymphatic: Aortic atherosclerosis. No enlarged abdominal or pelvic lymph nodes. Reproductive: Partial calcified uterine fibroid is noted. The adnexa are normal. Other: None. Musculoskeletal: No acute abnormality is noted. 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 The previously noted complicated cystic mass at the esophageal hiatus is unchanged, favor pancreatic pseudocyst. Electronically Signed   By: Abelardo Diesel M.D.   On: 05/02/2018 13:38    Assessment / Plan:     Assessment: 1.  Acute on chronic pancreatitis with large fluid collection: History of alcohol abuse, but also gallbladder stones, fluid measuring 20 x 10 cm, not technically a "pseudocyst" at this point and too mature to drain, patient barely tolerating full liquid diet and Ensure  Plan: 1.  Continue pain control and antiemetics 2.  Continue full liquid diet for now and Ensure 3 times daily, if she cannot maintain adequate oral nutrition will have to consider postpyloric nasal feeding tube which would likely need to be in place for a few weeks 3. Please await final recommendations from Dr. Silverio Decamp later today  Thank you for your kind consultation, we will continue to follow.    LOS: 2 days   Levin Erp  05/04/2018, 8:46 AM     Attending physician's note   I have taken an interval history, reviewed the chart and examined the patient. I agree with the Advanced Practitioner's note, impression and recommendations.   Pancreatitis with large pseudocyst evolving.  Pseudocyst is not mature enough for drainage. Continues to have generalized abdominal discomfort with poor p.o. intake Calorie count. If unable to meet calorie requirement, will have to consider postpyloric feeding tube or parenteral nutrition. Continue supportive care  Damaris Hippo , MD [phone]

## 2018-05-04 NOTE — Progress Notes (Signed)
PROGRESS NOTE    Amanda Hahn  ZOX:[id number] DOB: 07-23-1960 DOA: 05/02/2018 PCP: Patient, No Pcp Per    Brief Narrative:  58 y.o. female with pancreatitis, diabetes mellitus type 2, hypertension presented to hospital with complaints of nausea vomiting abdominal pain.  Of note patient was recently discharged from [facility] after treatment of pancreatitis.  She was in the hospital for at least 3 days and was discharged home almost 3 days back. Post discharge, she continued to have nausea, vomiting and severe abdominal pain.  Patient describes the pain as aching in sensation worse with eating and pressure to the abdomen.  It is severe in intensity with radiation to the back occasionally.  Patient states that that she has not eaten in several days and has poor appetite.  She had small bowel movement yesterday but is largely constipated.  Due to persistent symptoms, patient decided to come to our hospital.  Patient currently denies fever, chills or rigors.  Denies any urinary urgency, frequency or dysuria.  Denies cough, shortness of breath, fever or chills.  No chest pain or palpitation.  No dizziness lightheadedness or syncope.  ED Course: In the ED, patient had enderness over the epigastric region with some guarding.  Lipase was elevated about 165.  Potassium was low at 3.2.  Lipase elevated at 15.1.  CT scan of the abdomen repeated today showed large pseudocyst measuring around 20 x 10 cm which was completely new compared to a CT scan done 1 week back when she was admitted at [facility].  This case was discussed with Dr. Ardis Hughs of our GI as well as Dr. Tera Helper of general surgery.  No acute intervention were advised and patient was advised conservative treatment.  Patient was then considered for admission to the hospital for IV fluids pain control and nutrition.   Assessment & Plan:   Principal Problem:   Pancreatitis  Acute on likely chronic pancreatitis with  large cystic fluid collection.  Multiloculated mass at the esophageal hiatus concerns for large pseudocyst.  - GI following. No indication for drainage at this point in time. Recommendation for re-imaging several weeks later to decide if drain warranted then - complaining of mild abd discomfort with liquid diet - Per GI, recommend calorie count and if unable to meet calorie requirement, then consider post-pyloric feeding  Diabetes mellitus type 2.  Patient is on metformin at home.  Will hold while in hospital.. Continue with SSI coverage as tolerated  Leukocytosis.  no fever at this time.  Will closely monitor. Remains stable at this time  Hypokalemia.  Replaced  Hypertension.  Pt continued on amlodipine and metoprolol at home.  Stable at presnt  DVT prophylaxis: Lovenox subQ Code Status: Full Family Communication: Pt in room, family not at bedside Disposition Plan: Uncertain at this time  Consultants:   GI  Procedures:     Antimicrobials: Anti-infectives (From admission, onward)   None      Subjective: Tolerating liquid diet, however reports continued abd pain with food  Objective: Vitals:   05/03/18 1321 05/03/18 2208 05/04/18 0553 05/04/18 1401  BP: 140/87 131/84 139/84 116/71  Pulse: 80 84 83 (!) 113  Resp: 16 16 17 20   Temp: 99 F (37.2 C) 98.6 F (37 C) 98.4 F (36.9 C) 98.1 F (36.7 C)  TempSrc: Oral Oral Oral Oral  SpO2: 99% 98% 94% 98%  Weight:      Height:        Intake/Output Summary (Last 24 hours)  at 05/04/2018 1532 Last data filed at 05/04/2018 0802 Gross per 24 hour  Intake 1840 ml  Output -  Net 1840 ml   Filed Weights   05/02/18 1138  Weight: 78 kg    Examination: General exam: Awake, laying in bed, in nad Respiratory system: Normal respiratory effort, no wheezing Cardiovascular system: regular rate, s1, s2 Gastrointestinal system: Soft, nondistended, positive BS, mildlly tender Central nervous system: CN2-12 grossly intact,  strength intact Extremities: Perfused, no clubbing Skin: Normal skin turgor, no notable skin lesions seen Psychiatry: Mood normal // no visual hallucinations   Data Reviewed: I have personally reviewed following labs and imaging studies  CBC: Recent Labs  Lab 05/02/18 1218 05/03/18 0501  WBC 15.1* 17.2*  HGB 12.5 11.5*  HCT 38.1 35.3*  MCV 82.1 84.2  PLT 476* 413*   Basic Metabolic Panel: Recent Labs  Lab 05/02/18 1218 05/03/18 0501 05/04/18 0449  NA 136 139 137  K 3.2* 3.9 4.0  CL 96* 102 104  CO2 25 21* 21*  GLUCOSE 104* 75 113*  BUN 9 8 5*  CREATININE 0.62 0.71 0.58  CALCIUM 9.0 8.3* 8.4*   GFR: Estimated Creatinine Clearance: 73.3 mL/min (by C-G formula based on SCr of 0.58 mg/dL). Liver Function Tests: Recent Labs  Lab 05/02/18 1218 05/03/18 0501 05/04/18 0449  AST 23 19 19   ALT 9 9 7   ALKPHOS 154* 122 117  BILITOT 1.2 1.2 0.9  PROT 8.8* 7.1 6.8  ALBUMIN 2.8* 2.5* 2.3*   Recent Labs  Lab 05/02/18 1218 05/04/18 0449  LIPASE 165* 203*   No results for input(s): AMMONIA in the last 168 hours. Coagulation Profile: No results for input(s): INR, PROTIME in the last 168 hours. Cardiac Enzymes: No results for input(s): CKTOTAL, CKMB, CKMBINDEX, TROPONINI in the last 168 hours. BNP (last 3 results) No results for input(s): PROBNP in the last 8760 hours. HbA1C: No results for input(s): HGBA1C in the last 72 hours. CBG: Recent Labs  Lab 05/03/18 1142 05/03/18 1707 05/03/18 2352 05/04/18 0551 05/04/18 1146  GLUCAP 73 95 107* 97 117*   Lipid Profile: No results for input(s): CHOL, HDL, LDLCALC, TRIG, CHOLHDL, LDLDIRECT in the last 72 hours. Thyroid Function Tests: No results for input(s): TSH, T4TOTAL, FREET4, T3FREE, THYROIDAB in the last 72 hours. Anemia Panel: No results for input(s): VITAMINB12, FOLATE, FERRITIN, TIBC, IRON, RETICCTPCT in the last 72 hours. Sepsis Labs: No results for input(s): PROCALCITON, LATICACIDVEN in the last 168  hours.  No results found for this or any previous visit (from the past 240 hour(s)).   Radiology Studies: No results found.  Scheduled Meds: . amLODipine  10 mg Oral Daily  . enoxaparin (LOVENOX) injection  40 mg Subcutaneous Q24H  . feeding supplement (ENSURE ENLIVE)  237 mL Oral TID WC  . insulin aspart  0-9 Units Subcutaneous Q6H  . metoprolol tartrate  25 mg Oral BID  . sodium chloride flush  3 mL Intravenous Once   Continuous Infusions: . 0.9 % NaCl with KCl 20 mEq / L 100 mL/hr at 05/04/18 1223     LOS: 2 days   Marylu Lund, MD [facility] Pager On Amion  If 7PM-7AM, please contact night-coverage 05/04/2018, 3:32 PM

## 2018-05-05 DIAGNOSIS — K860 Alcohol-induced chronic pancreatitis: Secondary | ICD-10-CM

## 2018-05-05 LAB — COMPREHENSIVE METABOLIC PANEL
ALT: 8 U/L (ref 0–44)
AST: 24 U/L (ref 15–41)
Albumin: 2.4 g/dL — ABNORMAL LOW (ref 3.5–5.0)
Alkaline Phosphatase: 129 U/L — ABNORMAL HIGH (ref 38–126)
Anion gap: 9 (ref 5–15)
BUN: <5 mg/dL — ABNORMAL LOW (ref 6–20)
CO2: 25 mmol/L (ref 22–32)
Calcium: 8.4 mg/dL — ABNORMAL LOW (ref 8.9–10.3)
Chloride: 101 mmol/L (ref 98–111)
Creatinine, Ser: 0.52 mg/dL (ref 0.44–1.00)
GFR calc Af Amer: >60 mL/min (ref >60)
GFR calc non Af Amer: >60 mL/min (ref >60)
GLUCOSE: 124 mg/dL — AB (ref 70–99)
Potassium: 3.8 mmol/L (ref 3.5–5.1)
Sodium: 135 mmol/L (ref 135–145)
Total Bilirubin: 0.5 mg/dL (ref 0.3–1.2)
Total Protein: 6.9 g/dL (ref 6.5–8.1)

## 2018-05-05 LAB — GLUCOSE, CAPILLARY
GLUCOSE-CAPILLARY: 126 mg/dL — AB (ref 70–99)
Glucose-Capillary: 109 mg/dL — ABNORMAL HIGH (ref 70–99)
Glucose-Capillary: 128 mg/dL — ABNORMAL HIGH (ref 70–99)
Glucose-Capillary: 132 mg/dL — ABNORMAL HIGH (ref 70–99)
Glucose-Capillary: 171 mg/dL — ABNORMAL HIGH (ref 70–99)
Glucose-Capillary: 59 mg/dL — ABNORMAL LOW (ref 70–99)
Glucose-Capillary: 61 mg/dL — ABNORMAL LOW (ref 70–99)
Glucose-Capillary: 81 mg/dL (ref 70–99)

## 2018-05-05 LAB — LIPASE, BLOOD: Lipase: 865 U/L — ABNORMAL HIGH (ref 11–51)

## 2018-05-05 MED ORDER — DEXTROSE 50 % IV SOLN
INTRAVENOUS | Status: AC
  Administered 2018-05-05: 25 mL
  Filled 2018-05-05: qty 50

## 2018-05-05 MED ORDER — ADULT MULTIVITAMIN W/MINERALS CH
1.0000 | ORAL_TABLET | Freq: Every day | ORAL | Status: DC
  Administered 2018-05-05 – 2018-05-19 (×4): 1 via ORAL
  Filled 2018-05-05 (×13): qty 1

## 2018-05-05 MED ORDER — HYDROMORPHONE HCL 1 MG/ML IJ SOLN
1.0000 mg | INTRAMUSCULAR | Status: AC | PRN
  Administered 2018-05-05 – 2018-05-06 (×4): 1 mg via INTRAVENOUS
  Filled 2018-05-05 (×4): qty 1

## 2018-05-05 MED ORDER — DEXTROSE-NACL 5-0.9 % IV SOLN
INTRAVENOUS | Status: DC
  Administered 2018-05-05 – 2018-05-09 (×7): via INTRAVENOUS
  Administered 2018-05-10: 1000 mL via INTRAVENOUS

## 2018-05-05 MED ORDER — OXYCODONE HCL 5 MG PO TABS
5.0000 mg | ORAL_TABLET | ORAL | Status: AC | PRN
  Administered 2018-05-05 – 2018-05-06 (×3): 5 mg via ORAL
  Filled 2018-05-05 (×3): qty 1

## 2018-05-05 NOTE — Progress Notes (Signed)
Initial Nutrition Assessment  INTERVENTION:   -Continue Ensure Enlive po BID, each supplement provides 350 kcal and 20 grams of protein -Provide Magic cup TID with meals, each supplement provides 290 kcal and 9 grams of protein -Provide Multivitamin with minerals daily  -48 hour Calorie Count ordered, 1/21-1/22  NUTRITION DIAGNOSIS:   Increased nutrient needs related to (acute on chronic ETOH pancreatitis) as evidenced by estimated needs.  GOAL:   Patient will meet greater than or equal to 90% of their needs  MONITOR:   PO intake, Supplement acceptance, Labs, Weight trends, I & O's  REASON FOR ASSESSMENT:   Consult Assessment of nutrition requirement/status, Calorie Count  ASSESSMENT:   58 y.o. female with pancreatitis, diabetes mellitus type 2, hypertension presented to hospital with complaints of nausea vomiting abdominal pain.  Patient reports she is still feeling significant pain. Pt states she is doing well with her full liquid meals and she is trying to drink Ensure supplements. Per chart, pt drank 2 Ensures yesterday. Encouraged pt to try and drink 3 a day and eat as much as she can on her meals. RD placed envelope on door for Calorie Count. Per GI note, if patient does not meet estimated needs with full liquid diet, will need post-pyloric tube placement. Pt aware and wants to avoid this. Will add Magic Cups with meals for additional kcal and protein.  No weight history available beyond January 2020. Last recorded weight PTA was 160 lb on 1/8. Pt's weight has been increasing. Per GI note, pt with fluid collection which is not mature enough for drainage.  Medications: IV Zofran PRN Labs reviewed: CBGs:126-132   NUTRITION - FOCUSED PHYSICAL EXAM:  Nutrition focused physical exam shows no sign of depletion of muscle mass or body fat.  Diet Order:   Diet Order            Diet full liquid Room service appropriate? Yes; Fluid consistency: Thin  Diet effective now               EDUCATION NEEDS:   Education needs have been addressed  Skin:  Skin Assessment: Reviewed RN Assessment  Last BM:  1/20  Height:   Ht Readings from Last 1 Encounters:  05/02/18 5\' 2"  (1.575 m)    Weight:   Wt Readings from Last 1 Encounters:  05/02/18 78 kg    Ideal Body Weight:  50 kg  BMI:  Body mass index is 31.46 kg/m.  Estimated Nutritional Needs:   Kcal:  1500-1700  Protein:  70-80g  Fluid:  1.7L/day  Clayton Bibles, MS, RD, LDN [place] [facility] Pager: [phone] After Hours Pager: [phone]

## 2018-05-05 NOTE — Progress Notes (Signed)
PROGRESS NOTE    Amanda Hahn  JOA:[id number] DOB: 1961-02-28 DOA: 05/02/2018 PCP: Patient, No Pcp Per    Brief Narrative:  58 y.o. female with pancreatitis, diabetes mellitus type 2, hypertension presented to hospital with complaints of nausea vomiting abdominal pain.  Of note patient was recently discharged from [facility] after treatment of pancreatitis.  She was in the hospital for at least 3 days and was discharged home almost 3 days back. Post discharge, she continued to have nausea, vomiting and severe abdominal pain.  Patient describes the pain as aching in sensation worse with eating and pressure to the abdomen.  It is severe in intensity with radiation to the back occasionally.  Patient states that that she has not eaten in several days and has poor appetite.  She had small bowel movement yesterday but is largely constipated.  Due to persistent symptoms, patient decided to come to our hospital.  Patient currently denies fever, chills or rigors.  Denies any urinary urgency, frequency or dysuria.  Denies cough, shortness of breath, fever or chills.  No chest pain or palpitation.  No dizziness lightheadedness or syncope.  ED Course: In the ED, patient had enderness over the epigastric region with some guarding.  Lipase was elevated about 165.  Potassium was low at 3.2.  Lipase elevated at 15.1.  CT scan of the abdomen repeated today showed large pseudocyst measuring around 20 x 10 cm which was completely new compared to a CT scan done 1 week back when she was admitted at [facility].  This case was discussed with Dr. Ardis Hughs of our GI as well as Dr. Tera Helper of general surgery.  No acute intervention were advised and patient was advised conservative treatment.  Patient was then considered for admission to the hospital for IV fluids pain control and nutrition.   Assessment & Plan:   Principal Problem:   Pancreatitis  Acute on likely chronic pancreatitis with  large cystic fluid collection.  Multiloculated mass at the esophageal hiatus concerns for large pseudocyst.  - GI following. No indication for drainage at this point in time. Recommendation for re-imaging several weeks later to decide if drain warranted then - Complaining of continued generalized abd pain, feeling constipated, see below - Per GI, recommend calorie count and if unable to meet calorie requirement, then consider post-pyloric feeding. Discussed with GI today  Constipation -No BM over past 4-5 days -Abd distended -Given trial of soap suds enema with good results  Diabetes mellitus type 2.  Patient is on metformin at home.  Will hold while in hospital.. Continue with SSI as pt tolerates  Leukocytosis.  no fever at this time.  Will closely monitor. Remains stable at this time  Hypokalemia.  Replaced. Repeat bmet in AM  Hypertension.  Pt continued on amlodipine and metoprolol at home.  Vitals reviewed. Stable  DVT prophylaxis: Lovenox subQ Code Status: Full Family Communication: Pt in room, family not at bedside Disposition Plan: Uncertain at this time  Consultants:   GI  Procedures:     Antimicrobials: Anti-infectives (From admission, onward)   None      Subjective: Complains of abd distension and feelings of constipation  Objective: Vitals:   05/04/18 2138 05/05/18 0617 05/05/18 1337 05/05/18 1338  BP: (!) 141/90 (!) 141/92 (!) 153/92 (!) 153/53  Pulse: 87 92 92 74  Resp: 18 19 20 18   Temp: 98.6 F (37 C) 98.4 F (36.9 C) 98.7 F (37.1 C) 98.4 F (36.9 C)  TempSrc:  Oral Oral Oral Oral  SpO2: 97% 97% 98% 99%  Weight:      Height:        Intake/Output Summary (Last 24 hours) at 05/05/2018 1609 Last data filed at 05/05/2018 1100 Gross per 24 hour  Intake 3103.4 ml  Output -  Net 3103.4 ml   Filed Weights   05/02/18 1138  Weight: 78 kg    Examination: General exam: Conversant, in no acute distress Respiratory system: normal chest rise,  clear, no audible wheezing Cardiovascular system: regular rhythm, s1-s2 Gastrointestinal system: distended, decreased BS Central nervous system: No seizures, no tremors Extremities: No cyanosis, no joint deformities Skin: No rashes, no pallor Psychiatry: Affect normal // no auditory hallucinations   Data Reviewed: I have personally reviewed following labs and imaging studies  CBC: Recent Labs  Lab 05/02/18 1218 05/03/18 0501  WBC 15.1* 17.2*  HGB 12.5 11.5*  HCT 38.1 35.3*  MCV 82.1 84.2  PLT 476* 237*   Basic Metabolic Panel: Recent Labs  Lab 05/02/18 1218 05/03/18 0501 05/04/18 0449 05/05/18 0334  NA 136 139 137 135  K 3.2* 3.9 4.0 3.8  CL 96* 102 104 101  CO2 25 21* 21* 25  GLUCOSE 104* 75 113* 124*  BUN 9 8 5* <5*  CREATININE 0.62 0.71 0.58 0.52  CALCIUM 9.0 8.3* 8.4* 8.4*   GFR: Estimated Creatinine Clearance: 73.3 mL/min (by C-G formula based on SCr of 0.52 mg/dL). Liver Function Tests: Recent Labs  Lab 05/02/18 1218 05/03/18 0501 05/04/18 0449 05/05/18 0334  AST 23 19 19 24   ALT 9 9 7 8   ALKPHOS 154* 122 117 129*  BILITOT 1.2 1.2 0.9 0.5  PROT 8.8* 7.1 6.8 6.9  ALBUMIN 2.8* 2.5* 2.3* 2.4*   Recent Labs  Lab 05/02/18 1218 05/04/18 0449 05/05/18 0334  LIPASE 165* 203* 865*   No results for input(s): AMMONIA in the last 168 hours. Coagulation Profile: No results for input(s): INR, PROTIME in the last 168 hours. Cardiac Enzymes: No results for input(s): CKTOTAL, CKMB, CKMBINDEX, TROPONINI in the last 168 hours. BNP (last 3 results) No results for input(s): PROBNP in the last 8760 hours. HbA1C: No results for input(s): HGBA1C in the last 72 hours. CBG: Recent Labs  Lab 05/04/18 1146 05/04/18 1746 05/05/18 0052 05/05/18 0615 05/05/18 1136  GLUCAP 117* 135* 132* 126* 81   Lipid Profile: No results for input(s): CHOL, HDL, LDLCALC, TRIG, CHOLHDL, LDLDIRECT in the last 72 hours. Thyroid Function Tests: No results for input(s): TSH,  T4TOTAL, FREET4, T3FREE, THYROIDAB in the last 72 hours. Anemia Panel: No results for input(s): VITAMINB12, FOLATE, FERRITIN, TIBC, IRON, RETICCTPCT in the last 72 hours. Sepsis Labs: No results for input(s): PROCALCITON, LATICACIDVEN in the last 168 hours.  No results found for this or any previous visit (from the past 240 hour(s)).   Radiology Studies: No results found.  Scheduled Meds: . amLODipine  10 mg Oral Daily  . enoxaparin (LOVENOX) injection  40 mg Subcutaneous Q24H  . feeding supplement (ENSURE ENLIVE)  237 mL Oral TID WC  . insulin aspart  0-9 Units Subcutaneous Q6H  . metoprolol tartrate  25 mg Oral BID  . multivitamin with minerals  1 tablet Oral Daily  . sodium chloride flush  3 mL Intravenous Once   Continuous Infusions: . 0.9 % NaCl with KCl 20 mEq / L 100 mL/hr at 05/05/18 6283     LOS: 3 days   Marylu Lund, MD [facility] Pager On Amion  If 7PM-7AM,  please contact night-coverage 05/05/2018, 4:09 PM

## 2018-05-05 NOTE — Progress Notes (Signed)
CRITICAL VALUE ALERT  Critical Value:  CBG 59   Date & Time Notied: 05/05/2018  Provider Notified: Dr Wyline Copas  Orders Received/Actions taken:No orders received

## 2018-05-05 NOTE — Progress Notes (Addendum)
    Progress Note   Subjective  Chief Complaint: Pancreatitis with enlarging fluid collection  This morning patient tells me that her pain continues to decrease, but her pain medication seems to wear off "after 5 minutes".  She does have occasional nausea after eating but was able to tolerate two full Ensures yesterday, has not started eating yet today.  Aware of plans for postpyloric feeding tube for parenteral nutrition if she is unable to meet calorie requirement.  Patient does not want this.   Objective   Vital signs in last 24 hours: Temp:  [98.1 F (36.7 C)-98.6 F (37 C)] 98.4 F (36.9 C) (01/21 0617) Pulse Rate:  [87-113] 92 (01/21 0617) Resp:  [18-20] 19 (01/21 0617) BP: (116-141)/(71-92) 141/92 (01/21 0617) SpO2:  [97 %-98 %] 97 % (01/21 0617) Last BM Date: 05/04/18 General:    AA female in NAD Heart:  Regular rate and rhythm; no murmurs Lungs: Respirations even and unlabored, lungs CTA bilaterally Abdomen:  Soft, mild epigastric ttp and nondistended. Normal bowel sounds. +Inguinal hernia on the right, soft, non-tender Extremities:  Without edema. Neurologic:  Alert and oriented,  grossly normal neurologically. Psych:  Cooperative. Normal mood and affect.  Intake/Output from previous day: 01/20 0701 - 01/21 0700 In: 3103.4 [P.O.:720; I.V.:2383.4] Out: -   Lab Results: Recent Labs    05/02/18 1218 05/03/18 0501  WBC 15.1* 17.2*  HGB 12.5 11.5*  HCT 38.1 35.3*  PLT 476* 445*   BMET Recent Labs    05/03/18 0501 05/04/18 0449 05/05/18 0334  NA 139 137 135  K 3.9 4.0 3.8  CL 102 104 101  CO2 21* 21* 25  GLUCOSE 75 113* 124*  BUN 8 5* <5*  CREATININE 0.71 0.58 0.52  CALCIUM 8.3* 8.4* 8.4*   LFT Recent Labs    05/05/18 0334  PROT 6.9  ALBUMIN 2.4*  AST 24  ALT 8  ALKPHOS 129*  BILITOT 0.5    Assessment / Plan:   Assessment: 1.  Acute on chronic pancreatitis with large fluid collection: History of alcohol abuse and also gallbladder stones,  fluid measuring 20 x 10 cm, still maturing and not able to drain at this time, continues with some generalized abdominal discomfort and poor p.o. intake, some better yesterday with 2 full Ensures per patient, has not started eating today  Plan: 1.  Again patient needs a strict calorie count, if unable to meet calorie requirement will need to have postpyloric feeding tube or parental nutrition- will consult nutrition 2.  Continue supportive care 3.  Discussed what feels like an inguinal hernia with the patient today on the right, there is no pain with this and likely no intervention needed.  Interestingly this is not noted on recent CT of the abdomen pelvis 4.  Please await any further recommendations from Dr. Silverio Decamp later today  We will continue to follow along.   LOS: 3 days   Levin Erp  05/05/2018, 9:34 AM    Attending physician's note   I have taken an interval history, reviewed the chart and examined the patient. I agree with the Advanced Practitioner's note, impression and recommendations.   Acute on chronic pancreatitis with large pseudocyst.  Continue supportive care and pain control Diet as tolerated, calorie count We will continue to follow  K. Denzil Magnuson , MD [phone]

## 2018-05-05 NOTE — Progress Notes (Addendum)
CBG 59 asymptomatic, hypoglycemic protocol initiated juice given CBG rechecked in 15 min reading was 61, patient c/o inability to take orals 1/2 amp D50 administered per hypoglycemic protocol CBG reading 128. Dr. Wyline Copas paged to make aware. Dr Wyline Copas returned the call and will enter orders to change IV fluids. Will continue to monitor patient.

## 2018-05-06 ENCOUNTER — Inpatient Hospital Stay (HOSPITAL_COMMUNITY): Payer: Self-pay

## 2018-05-06 DIAGNOSIS — K311 Adult hypertrophic pyloric stenosis: Secondary | ICD-10-CM | POA: Diagnosis present

## 2018-05-06 DIAGNOSIS — R1110 Vomiting, unspecified: Secondary | ICD-10-CM | POA: Diagnosis present

## 2018-05-06 DIAGNOSIS — R140 Abdominal distension (gaseous): Secondary | ICD-10-CM | POA: Diagnosis present

## 2018-05-06 DIAGNOSIS — K8689 Other specified diseases of pancreas: Secondary | ICD-10-CM

## 2018-05-06 LAB — COMPREHENSIVE METABOLIC PANEL
ALT: 10 U/L (ref 0–44)
ANION GAP: 12 (ref 5–15)
AST: 25 U/L (ref 15–41)
Albumin: 2.7 g/dL — ABNORMAL LOW (ref 3.5–5.0)
Alkaline Phosphatase: 142 U/L — ABNORMAL HIGH (ref 38–126)
BUN: <5 mg/dL — ABNORMAL LOW (ref 6–20)
CO2: 26 mmol/L (ref 22–32)
Calcium: 8.8 mg/dL — ABNORMAL LOW (ref 8.9–10.3)
Chloride: 98 mmol/L (ref 98–111)
Creatinine, Ser: 0.51 mg/dL (ref 0.44–1.00)
GFR calc Af Amer: >60 mL/min (ref >60)
GFR calc non Af Amer: >60 mL/min (ref >60)
Glucose, Bld: 130 mg/dL — ABNORMAL HIGH (ref 70–99)
Potassium: 3.6 mmol/L (ref 3.5–5.1)
Sodium: 136 mmol/L (ref 135–145)
Total Bilirubin: 0.5 mg/dL (ref 0.3–1.2)
Total Protein: 7.8 g/dL (ref 6.5–8.1)

## 2018-05-06 LAB — GLUCOSE, CAPILLARY
GLUCOSE-CAPILLARY: 140 mg/dL — AB (ref 70–99)
Glucose-Capillary: 112 mg/dL — ABNORMAL HIGH (ref 70–99)
Glucose-Capillary: 140 mg/dL — ABNORMAL HIGH (ref 70–99)
Glucose-Capillary: 130 mg/dL — ABNORMAL HIGH (ref 70–99)

## 2018-05-06 MED ORDER — HYDROMORPHONE HCL 1 MG/ML IJ SOLN
1.0000 mg | INTRAMUSCULAR | Status: AC | PRN
  Administered 2018-05-06 – 2018-05-07 (×4): 1 mg via INTRAVENOUS
  Filled 2018-05-06 (×4): qty 1

## 2018-05-06 MED ORDER — METOPROLOL TARTRATE 5 MG/5ML IV SOLN: 2.5000 mg | Freq: Four times a day (QID) | INTRAVENOUS | Status: DC | PRN

## 2018-05-06 MED ORDER — HYDROMORPHONE HCL 1 MG/ML IJ SOLN
1.0000 mg | INTRAMUSCULAR | Status: AC | PRN
  Administered 2018-05-06 (×4): 1 mg via INTRAVENOUS
  Filled 2018-05-06 (×4): qty 1

## 2018-05-06 NOTE — Progress Notes (Signed)
PROGRESS NOTE  Chanetta Moosman BOF:[id number] DOB: 1961-04-03 DOA: 05/02/2018 PCP: Patient, No Pcp Per  HPI/Recap of past 24 hours: 58 y.o.femalewith pancreatitis,diabetes mellitus type 2,hypertensionpresented to hospital with complaints of nausea vomiting abdominal pain. Of note patient was recently discharged from [facility] after treatment of pancreatitis. She was in the hospital for at least 3 days and wasdischarged home almost 3 days back. Post discharge,she continued to have nausea,vomiting and severe abdominal pain. Patientdescribes the pain as aching in sensation worse with eating and pressure to the abdomen. It is severe in intensitywith radiation to the back occasionally. Patient states that that she has not eaten in several days and has poor appetite. She had small bowel movement yesterday but is largely constipated. Due to persistent symptoms, patient [facility]. Patient currently denies fever, chills or rigors. Denies any urinary urgency, frequency or dysuria. Denies cough, shortness of breath, fever or chills. No chest pain or palpitation. No dizziness lightheadedness or syncope.  ED Course:In the ED, patient hadenderness over the epigastric region with some guarding.Lipase was elevated about 165. Potassium was low at 3.2. Lipase elevated at 15.1.CT scan of the abdomen repeated today showed large pseudocyst measuring around 20 x 10 cm which was completely new compared to a CT scan done 1 week back when she was admitted at [facility]. This case was discussed with Dr. Ardis Hughs of our GI as well as Dr. Tera Helper of general surgery. No acute intervention were advised and patient was advised conservative treatment. Patient was then considered for admission to the hospital for IV fluids pain control and nutrition.  05/06/2018: Patient seen and examined at the bedside.  She reports severe diffuse abdominal pain, 10 out  of 10 and nonradiating chest and now after receiving IV Dilaudid.  Made patient n.p.o. due to slow improvement and worsening leukocytosis.    Assessment/Plan: Principal Problem:   Pancreatitis  Acute on chronic pancreatitis with concern for large pseudocyst Independently reviewed CT abdomen and pelvis with contrast which revealed large pseudocyst at the esophageal hiatus. GI and general surgery following Recommendation for reimaging several weeks later to decide if drain warranted N.p.o. for now Continue to closely monitor Pain management in place IV fluid hydration IV antiemetic  Type 2 diabetes complicated by hypoglycemia Continue with D5 half-normal saline at 50 cc/h Latest hemoglobin A1c 8.8 on 04/23/2018 On metformin 500 mg twice daily at home  Hypertension Blood pressure elevated suspect contributed by pain On amlodipine 10 mg daily in Lopressor 25 mg twice daily at home, Continue Continue to monitor vital sign Treat pain If elevated blood pressure persists despite treatment for pain we will treat  Risks: High risk decompensation due to acute on chronic pancreatitis with concern for pseudocyst, multiple comorbidities and advanced age.  DVT prophylaxis: Lovenox subQ Code Status: Full Family Communication: Pt in room, family not at bedside Disposition Plan: Uncertain at this time  Consultants:   GI  Procedures:   None   Objective: Vitals:   05/05/18 1338 05/05/18 2222 05/06/18 0504 05/06/18 0506  BP: (!) 153/53 (!) 163/103  (!) 152/98  Pulse: 74 95 96 94  Resp: 18 16  16   Temp: 98.4 F (36.9 C) 98.7 F (37.1 C)  98.6 F (37 C)  TempSrc: Oral Oral  Oral  SpO2: 99% 97% 97% 98%  Weight:      Height:        Intake/Output Summary (Last 24 hours) at 05/06/2018 0954 Last data filed at 05/05/2018 2200  Gross per 24 hour  Intake 1577.76 ml  Output -  Net 1577.76 ml   Filed Weights   05/02/18 1138  Weight: 78 kg    Exam:  . General: 58 y.o.  year-old female well developed well nourished in no acute distress.  Alert and oriented x3. . Cardiovascular: Regular rate and rhythm with no rubs or gallops.  No thyromegaly or JVD noted.   Marland Kitchen Respiratory: Clear to auscultation with no wheezes or rales. Good inspiratory effort. . Abdomen: Significant tenderness with minimal palpation.  Normal bowel sounds x4 quadrant.  Nondistended. . Musculoskeletal: No lower extremity edema. 2/4 pulses in all 4 extremities. . Skin: No ulcerative lesions noted or rashes, . Psychiatry: Mood is appropriate for condition and setting   Data Reviewed: CBC: Recent Labs  Lab 05/02/18 1218 05/03/18 0501  WBC 15.1* 17.2*  HGB 12.5 11.5*  HCT 38.1 35.3*  MCV 82.1 84.2  PLT 476* 017*   Basic Metabolic Panel: Recent Labs  Lab 05/02/18 1218 05/03/18 0501 05/04/18 0449 05/05/18 0334 05/06/18 0312  NA 136 139 137 135 136  K 3.2* 3.9 4.0 3.8 3.6  CL 96* 102 104 101 98  CO2 25 21* 21* 25 26  GLUCOSE 104* 75 113* 124* 130*  BUN 9 8 5* <5* <5*  CREATININE 0.62 0.71 0.58 0.52 0.51  CALCIUM 9.0 8.3* 8.4* 8.4* 8.8*   GFR: Estimated Creatinine Clearance: 73.3 mL/min (by C-G formula based on SCr of 0.51 mg/dL). Liver Function Tests: Recent Labs  Lab 05/02/18 1218 05/03/18 0501 05/04/18 0449 05/05/18 0334 05/06/18 0312  AST 23 19 19 24 25   ALT 9 9 7 8 10   ALKPHOS 154* 122 117 129* 142*  BILITOT 1.2 1.2 0.9 0.5 0.5  PROT 8.8* 7.1 6.8 6.9 7.8  ALBUMIN 2.8* 2.5* 2.3* 2.4* 2.7*   Recent Labs  Lab 05/02/18 1218 05/04/18 0449 05/05/18 0334  LIPASE 165* 203* 865*   No results for input(s): AMMONIA in the last 168 hours. Coagulation Profile: No results for input(s): INR, PROTIME in the last 168 hours. Cardiac Enzymes: No results for input(s): CKTOTAL, CKMB, CKMBINDEX, TROPONINI in the last 168 hours. BNP (last 3 results) No results for input(s): PROBNP in the last 8760 hours. HbA1C: No results for input(s): HGBA1C in the last 72  hours. CBG: Recent Labs  Lab 05/05/18 1812 05/05/18 1949 05/05/18 2337 05/06/18 0510 05/06/18 0704  GLUCAP 128* 109* 171* 112* 140*   Lipid Profile: No results for input(s): CHOL, HDL, LDLCALC, TRIG, CHOLHDL, LDLDIRECT in the last 72 hours. Thyroid Function Tests: No results for input(s): TSH, T4TOTAL, FREET4, T3FREE, THYROIDAB in the last 72 hours. Anemia Panel: No results for input(s): VITAMINB12, FOLATE, FERRITIN, TIBC, IRON, RETICCTPCT in the last 72 hours. Urine analysis:    Component Value Date/Time   COLORURINE YELLOW 05/02/2018 1121   APPEARANCEUR HAZY (A) 05/02/2018 1121   LABSPEC 1.017 05/02/2018 1121   PHURINE 6.0 05/02/2018 1121   GLUCOSEU NEGATIVE 05/02/2018 1121   HGBUR SMALL (A) 05/02/2018 1121   BILIRUBINUR NEGATIVE 05/02/2018 1121   KETONESUR 20 (A) 05/02/2018 1121   PROTEINUR 30 (A) 05/02/2018 1121   UROBILINOGEN 0.2 09/03/2010 1342   NITRITE NEGATIVE 05/02/2018 1121   LEUKOCYTESUR NEGATIVE 05/02/2018 1121   Sepsis Labs: @LABRCNTIP (procalcitonin:4,lacticidven:4)  )No results found for this or any previous visit (from the past 240 hour(s)).    Studies: No results found.  Scheduled Meds: . amLODipine  10 mg Oral Daily  . enoxaparin (LOVENOX) injection  40 mg Subcutaneous  Q24H  . feeding supplement (ENSURE ENLIVE)  237 mL Oral TID WC  . insulin aspart  0-9 Units Subcutaneous Q6H  . metoprolol tartrate  25 mg Oral BID  . multivitamin with minerals  1 tablet Oral Daily  . sodium chloride flush  3 mL Intravenous Once    Continuous Infusions: . dextrose 5 % and 0.9% NaCl 75 mL/hr at 05/05/18 2014     LOS: 4 days     Kayleen Memos, MD [facility] Pager [phone]  If 7PM-7AM, please contact night-coverage [email or web address] Password [facility] 05/06/2018, 9:54 AM

## 2018-05-06 NOTE — Progress Notes (Signed)
Nutrition Note  Initial assessment was completed 1/21. Calorie Count was ordered.  Pt was recorded as consuming 100% of breakfast tray on 1/21 (provided 240 kcal and 11g protein). Pt has continued to have abdominal pain. MD made pt NPO today.  RD has d/c Calorie Count as pt unable to attempt to meet needs.  Please consult if Calorie Count is to be re-initiated. Will continue to monitor plans.  Clayton Bibles, MS, RD, [place] [facility] Pager: [phone] After Hours Pager: [phone]

## 2018-05-06 NOTE — Progress Notes (Addendum)
     [facility] Progress Note  CC:  ETOH pancreatitis and enlarging fluid collection  Subjective:  Feels terrible.  A lot of nausea.  Vomiting a lot this AM.  Abdominal x-ray suggests GOO.  Objective:  Vital signs in last 24 hours: Temp:  [97.8 F (36.6 C)-98.7 F (37.1 C)] 97.8 F (36.6 C) (01/22 1346) Pulse Rate:  [90-99] 90 (01/22 1346) Resp:  [16] 16 (01/22 1346) BP: (144-163)/(92-103) 144/92 (01/22 1346) SpO2:  [97 %-98 %] 97 % (01/22 1346) Last BM Date: 05/05/18 General:  Alert, Well-developed, in NAD Heart:  Regular rate and rhythm; no murmurs Pulm:  CTAB.  No increased WOB. Abdomen:  Soft, non-distended but some fullness sensation in upper abdomen.  BS present but quiet.  Mild TTP on light palpation. Extremities:  Without edema. Neurologic:  Alert and oriented x 4;  grossly normal neurologically. Psych:  Alert and cooperative. Normal mood and affect.  Intake/Output from previous day: 01/21 0701 - 01/22 0700 In: 1817.8 [P.O.:480; I.V.:1337.8] Out: -   BMET Recent Labs    05/04/18 0449 05/05/18 0334 05/06/18 0312  NA 137 135 136  K 4.0 3.8 3.6  CL 104 101 98  CO2 21* 25 26  GLUCOSE 113* 124* 130*  BUN 5* <5* <5*  CREATININE 0.58 0.52 0.51  CALCIUM 8.4* 8.4* 8.8*   LFT Recent Labs    05/06/18 0312  PROT 7.8  ALBUMIN 2.7*  AST 25  ALT 10  ALKPHOS 142*  BILITOT 0.5   Dg Abd 2 Views  Result Date: 05/06/2018 CLINICAL DATA:  58 year old female with a history of pancreatitis EXAM: ABDOMEN - 2 VIEW COMPARISON:  CT 05/02/2018, 04/23/2018 FINDINGS: Large gas and air-fluid level within the stomach on upright and supine images. Relative paucity of gas within small bowel. Relative paucity of gas within colon. Coarse calcifications in the region the pancreatic head, corresponds to findings on prior CT. No displaced fracture. No radiopaque foreign body. IMPRESSION: Large gas and fluid collection of stomach in this patient with known pancreatitis and  pseudocyst, suggesting gastric outlet obstruction. Changes of chronic pancreatitis. Electronically Signed   By: Corrie Mckusick D.O.   On: 05/06/2018 12:20   Assessment / Plan: 1.  Acute on chronic pancreatitis with large fluid collection:  Fluid collection measuring 20 x 10 cm, still maturing and not able to drain at this time.  Started with a lot of nausea and vomiting overnight and into this AM.  Abdominal x-ray concerning for GOO.    -Will order NGT to LIS. -NPO. -Antiemetics. -Is likely going to need other means of nutrition. -Await input from Dr. Silverio Decamp.   LOS: 4 days   Laban Emperor. Zehr  05/06/2018, 2:07 PM   Attending physician's note   I have taken an interval history, reviewed the chart and examined the patient. I agree with the Advanced Practitioner's note, impression and recommendations.   Persistent abdominal pain with nausea and vomiting.  Abdominal x-ray suggestive of gastric outlet obstruction Pancreatitis with large pseudocyst, not mature enough for drainage yet. N.p.o. NG tube to decompress We will have to either consider postpyloric Dobbhoff placement for nutrition or TPN.  Patient has been reluctant, will rediscuss tomorrow.  Damaris Hippo , MD [phone]

## 2018-05-07 DIAGNOSIS — K859 Acute pancreatitis without necrosis or infection, unspecified: Secondary | ICD-10-CM | POA: Diagnosis present

## 2018-05-07 LAB — CBC
HCT: 28.8 % — ABNORMAL LOW (ref 36.0–46.0)
HEMOGLOBIN: 9.5 g/dL — AB (ref 12.0–15.0)
MCH: 27.5 pg (ref 26.0–34.0)
MCHC: 33 g/dL (ref 30.0–36.0)
MCV: 83.2 fL (ref 80.0–100.0)
Platelets: 430 10*3/uL — ABNORMAL HIGH (ref 150–400)
RBC: 3.46 MIL/uL — ABNORMAL LOW (ref 3.87–5.11)
RDW: 14.8 % (ref 11.5–15.5)
WBC: 9.7 10*3/uL (ref 4.0–10.5)
nRBC: 0 % (ref 0.0–0.2)

## 2018-05-07 LAB — GLUCOSE, CAPILLARY
GLUCOSE-CAPILLARY: 134 mg/dL — AB (ref 70–99)
Glucose-Capillary: 110 mg/dL — ABNORMAL HIGH (ref 70–99)
Glucose-Capillary: 120 mg/dL — ABNORMAL HIGH (ref 70–99)
Glucose-Capillary: 134 mg/dL — ABNORMAL HIGH (ref 70–99)
Glucose-Capillary: 138 mg/dL — ABNORMAL HIGH (ref 70–99)

## 2018-05-07 LAB — BASIC METABOLIC PANEL
Anion gap: 9 (ref 5–15)
BUN: 5 mg/dL — ABNORMAL LOW (ref 6–20)
CHLORIDE: 102 mmol/L (ref 98–111)
CO2: 27 mmol/L (ref 22–32)
Calcium: 8.2 mg/dL — ABNORMAL LOW (ref 8.9–10.3)
Creatinine, Ser: 0.57 mg/dL (ref 0.44–1.00)
GFR calc non Af Amer: >60 mL/min (ref >60)
Glucose, Bld: 139 mg/dL — ABNORMAL HIGH (ref 70–99)
Potassium: 3.5 mmol/L (ref 3.5–5.1)
SODIUM: 138 mmol/L (ref 135–145)

## 2018-05-07 LAB — LIPASE, BLOOD: Lipase: 970 U/L — ABNORMAL HIGH (ref 11–51)

## 2018-05-07 MED ORDER — PHENOL 1.4 % MT LIQD
1.0000 | OROMUCOSAL | Status: DC | PRN
  Filled 2018-05-07: qty 177

## 2018-05-07 MED ORDER — HYDROMORPHONE HCL 1 MG/ML IJ SOLN
1.0000 mg | INTRAMUSCULAR | Status: AC | PRN
  Administered 2018-05-07 (×4): 1 mg via INTRAVENOUS
  Filled 2018-05-07 (×4): qty 1

## 2018-05-07 NOTE — Progress Notes (Signed)
PROGRESS NOTE  Amanda Hahn TGG:[id number] DOB: 02/07/1961 DOA: 05/02/2018 PCP: Patient, No Pcp Per  HPI/Recap of past 24 hours: 58 y.o.femalewith pancreatitis,diabetes mellitus type 2,hypertensionpresented to hospital with complaints of nausea vomiting abdominal pain. Of note patient was recently discharged from [facility] after treatment of pancreatitis. She was in the hospital for at least 3 days and wasdischarged home almost 3 days back. Post discharge,she continued to have nausea,vomiting and severe abdominal pain. Patientdescribes the pain as aching in sensation worse with eating and pressure to the abdomen. It is severe in intensitywith radiation to the back occasionally. Patient states that that she has not eaten in several days and has poor appetite. She had small bowel movement yesterday but is largely constipated. Due to persistent symptoms, patient decided to [facility]. Patient currently denies fever, chills or rigors. Denies any urinary urgency, frequency or dysuria. Denies cough, shortness of breath, fever or chills. No chest pain or palpitation. No dizziness lightheadedness or syncope.  ED Course:In the ED, patient hadenderness over the epigastric region with some guarding.Lipase was elevated about 165. Potassium was low at 3.2. Lipase elevated at 15.1.CT scan of the abdomen repeated today showed large pseudocyst measuring around 20 x 10 cm which was completely new compared to a CT scan done 1 week back when she was admitted at [facility]. This case was discussed with Dr. Ardis Hughs of our GI as well as Dr. Tera Helper of general surgery. No acute intervention were advised and patient was advised conservative treatment. Patient was then considered for admission to the hospital for IV fluids pain control and nutrition.  05/06/2018: Patient seen and examined at the bedside.  She reports severe diffuse abdominal pain, 10 out  of 10 and nonradiating chest and now after receiving IV Dilaudid.  Made patient n.p.o. due to slow improvement and worsening leukocytosis.  05/07/2018: Patient seen and examined at her bedside.  No acute events overnight.  States she feels better after the NG tube was placed in yesterday.  She wants the NG tube to suction out.  She states she is hungry. Lipase is trending up.    Assessment/Plan: Principal Problem:   Peripancreatic fluid collection Active Problems:   Abdominal distension (gaseous)   Vomiting   Gastric outlet obstruction  Acute on chronic pancreatitis with large pseudocyst and concern for gastric outlet obstruction Independently reviewed CT abdomen and pelvis with contrast done on 05/02/2018 which revealed large pseudocyst at the esophageal hiatus compared with CT abdomen pelvis with contrast done on 04/23/2018. Independently reviewed abdomen 2 view x-ray done on 05/06/2018 which revealed evidence of gastric outlet obstruction and noted calcification of the pancrea GI and general surgery following Recommendation for reimaging several weeks later to decide if drain warranted Continue NG to suction as recommended by general surgery Continue supportive care Lipase trending up 970 on 05/07/2018 from 865 on 05/05/2018 Continue to trend lipase  Elevated alkaline phosphatase Level is trending up Continue to monitor  Resolved leukocytosis SuspectED reactive from pancreatitis Leukocytosis resolved after NG tube to suction inserted Her symptoms improved also  Normocytic anemia Appears to be dilutional from IV fluid hydration Hemoglobin dropped from 11.5 on 119 20-9.5 on 05/07/2018 No sign of overt bleeding Repeat CBC in the morning Baseline hemoglobin appears to be 12.5 with MCV of 82  Type 2 diabetes complicated by hypoglycemia Continue with D5 half-normal saline at 50 cc/h Latest hemoglobin A1c 8.8 on 04/23/2018 On metformin 500 mg twice daily at home Hypoglycemia has  resolved,  not recurrent  Hypertension Blood pressure is stable Continue amlodipine 10 mg daily, Lopressor 25 mg twice daily   Risks: High risk decompensation due to acute on chronic pancreatitis with concern for pseudocyst, multiple comorbidities and 58 years old.  DVT prophylaxis: Lovenox subQ Code Status: Full Family Communication:  None at bedside Disposition Plan:  Discharge to home once general surgery and GI sign off.  Consultants:   GI  General surgery  Procedures:   NG to suction placement on 05/06/2018   Objective: Vitals:   05/06/18 1000 05/06/18 1346 05/06/18 2115 05/07/18 0555  BP:  (!) 144/92 119/78 137/89  Pulse: 99 90 91 84  Resp:  16 20 20   Temp:  97.8 F (36.6 C) 99.3 F (37.4 C) 98.2 F (36.8 C)  TempSrc:  Oral Oral Oral  SpO2:  97% 97% 98%  Weight:      Height:        Intake/Output Summary (Last 24 hours) at 05/07/2018 1235 Last data filed at 05/07/2018 0406 Gross per 24 hour  Intake 1653.61 ml  Output 800 ml  Net 853.61 ml   Filed Weights   05/02/18 1138  Weight: 78 kg    Exam:  . General: 58 y.o. year-old female well developed well-nourished in no acute distress.  Alert and oriented x3.  NG-tube to suction in place. . Cardiovascular: Regular rate and rhythm with no rubs or gallops.  No JVD or thyromegaly noted. Marland Kitchen Respiratory: Clear to auscultation with no wheezes or rales.  Good inspiratory effort. . Abdomen: Hypoactive bowel sounds.  Patient has NG tube to suction in place.  Abdomen is nondistended and soft.   . Musculoskeletal: No lower extremity edema. 2/4 pulses in all 4 extremities. Marland Kitchen Psychiatry: Mood is appropriate for condition and setting   Data Reviewed: CBC: Recent Labs  Lab 05/02/18 1218 05/03/18 0501 05/07/18 0431  WBC 15.1* 17.2* 9.7  HGB 12.5 11.5* 9.5*  HCT 38.1 35.3* 28.8*  MCV 82.1 84.2 83.2  PLT 476* 445* 001*   Basic Metabolic Panel: Recent Labs  Lab 05/03/18 0501 05/04/18 0449 05/05/18 0334  05/06/18 0312 05/07/18 0431  NA 139 137 135 136 138  K 3.9 4.0 3.8 3.6 3.5  CL 102 104 101 98 102  CO2 21* 21* 25 26 27   GLUCOSE 75 113* 124* 130* 139*  BUN 8 5* <5* <5* 5*  CREATININE 0.71 0.58 0.52 0.51 0.57  CALCIUM 8.3* 8.4* 8.4* 8.8* 8.2*   GFR: Estimated Creatinine Clearance: 73.3 mL/min (by C-G formula based on SCr of 0.57 mg/dL). Liver Function Tests: Recent Labs  Lab 05/02/18 1218 05/03/18 0501 05/04/18 0449 05/05/18 0334 05/06/18 0312  AST 23 19 19 24 25   ALT 9 9 7 8 10   ALKPHOS 154* 122 117 129* 142*  BILITOT 1.2 1.2 0.9 0.5 0.5  PROT 8.8* 7.1 6.8 6.9 7.8  ALBUMIN 2.8* 2.5* 2.3* 2.4* 2.7*   Recent Labs  Lab 05/02/18 1218 05/04/18 0449 05/05/18 0334 05/07/18 0431  LIPASE 165* 203* 865* 970*   No results for input(s): AMMONIA in the last 168 hours. Coagulation Profile: No results for input(s): INR, PROTIME in the last 168 hours. Cardiac Enzymes: No results for input(s): CKTOTAL, CKMB, CKMBINDEX, TROPONINI in the last 168 hours. BNP (last 3 results) No results for input(s): PROBNP in the last 8760 hours. HbA1C: No results for input(s): HGBA1C in the last 72 hours. CBG: Recent Labs  Lab 05/06/18 1254 05/06/18 1812 05/07/18 0030 05/07/18 0552 05/07/18 1206  GLUCAP 140* 130*  138* 120* 134*   Lipid Profile: No results for input(s): CHOL, HDL, LDLCALC, TRIG, CHOLHDL, LDLDIRECT in the last 72 hours. Thyroid Function Tests: No results for input(s): TSH, T4TOTAL, FREET4, T3FREE, THYROIDAB in the last 72 hours. Anemia Panel: No results for input(s): VITAMINB12, FOLATE, FERRITIN, TIBC, IRON, RETICCTPCT in the last 72 hours. Urine analysis:    Component Value Date/Time   COLORURINE YELLOW 05/02/2018 1121   APPEARANCEUR HAZY (A) 05/02/2018 1121   LABSPEC 1.017 05/02/2018 1121   PHURINE 6.0 05/02/2018 1121   GLUCOSEU NEGATIVE 05/02/2018 1121   HGBUR SMALL (A) 05/02/2018 1121   BILIRUBINUR NEGATIVE 05/02/2018 1121   KETONESUR 20 (A) 05/02/2018 1121    PROTEINUR 30 (A) 05/02/2018 1121   UROBILINOGEN 0.2 09/03/2010 1342   NITRITE NEGATIVE 05/02/2018 1121   LEUKOCYTESUR NEGATIVE 05/02/2018 1121   Sepsis Labs: @LABRCNTIP (procalcitonin:4,lacticidven:4)  )No results found for this or any previous visit (from the past 240 hour(s)).    Studies: Dg Abd 1 View  Result Date: 05/06/2018 CLINICAL DATA:  Evaluate NG tube placement. EXAM: ABDOMEN - 1 VIEW COMPARISON:  05/06/2018 . FINDINGS: The enteric tube is identified within the left upper quadrant of the abdomen and projects over the expected location of the gastric fundus. Calcifications within the head of pancreas are again identified compatible with chronic pancreatitis. IMPRESSION: 1. Enteric tube tip projects over the left upper quadrant of the abdomen in the projection of the expected location of gastric fundus. Electronically Signed   By: Kerby Moors M.D.   On: 05/06/2018 16:20    Scheduled Meds: . amLODipine  10 mg Oral Daily  . enoxaparin (LOVENOX) injection  40 mg Subcutaneous Q24H  . feeding supplement (ENSURE ENLIVE)  237 mL Oral TID WC  . insulin aspart  0-9 Units Subcutaneous Q6H  . metoprolol tartrate  25 mg Oral BID  . multivitamin with minerals  1 tablet Oral Daily  . sodium chloride flush  3 mL Intravenous Once    Continuous Infusions: . dextrose 5 % and 0.9% NaCl 75 mL/hr at 05/07/18 0406     LOS: 5 days     Kayleen Memos, MD [facility] Pager [phone]  If 7PM-7AM, please contact night-coverage [email or web address] Password [facility] 05/07/2018, 12:35 PM

## 2018-05-07 NOTE — Progress Notes (Addendum)
[facility] Progress Note  CC:  ETOH pancreatitis and enlarging fluid collection  Subjective:  Feels better with NGT decompression, but obviously tube is uncomfortable.  Per her nurse, 800 cc out yesterday immediately with placing NGT and then at least 400 cc's since 7 AM today.  Objective:  Vital signs in last 24 hours: Temp:  [97.8 F (36.6 C)-99.3 F (37.4 C)] 98.2 F (36.8 C) (01/23 0555) Pulse Rate:  [84-99] 84 (01/23 0555) Resp:  [16-20] 20 (01/23 0555) BP: (119-154)/(78-103) 137/89 (01/23 0555) SpO2:  [97 %-98 %] 98 % (01/23 0555) Last BM Date: 05/06/18 General:  Alert, Well-developed, in NAD; tearful Heart:  Regular rate and rhythm; no murmurs Pulm:  CTAB.  No increased WOB. Abdomen:  Soft, non-distended, some fullness in upper abdomen.  BS present.  Mild upper abdominal TTP. Extremities:  Without edema. Neurologic:  Alert and  oriented x 4;  grossly normal neurologically. Psych:  Alert and cooperative. Normal mood and affect.  Intake/Output from previous day: 01/22 0701 - 01/23 0700 In: 1653.6 [I.V.:1653.6] Out: 800 [Emesis/NG output:800]  Lab Results: Recent Labs    05/07/18 0431  WBC 9.7  HGB 9.5*  HCT 28.8*  PLT 430*   BMET Recent Labs    05/05/18 0334 05/06/18 0312 05/07/18 0431  NA 135 136 138  K 3.8 3.6 3.5  CL 101 98 102  CO2 25 26 27   GLUCOSE 124* 130* 139*  BUN <5* <5* 5*  CREATININE 0.52 0.51 0.57  CALCIUM 8.4* 8.8* 8.2*   LFT Recent Labs    05/06/18 0312  PROT 7.8  ALBUMIN 2.7*  AST 25  ALT 10  ALKPHOS 142*  BILITOT 0.5   Dg Abd 1 View  Result Date: 05/06/2018 CLINICAL DATA:  Evaluate NG tube placement. EXAM: ABDOMEN - 1 VIEW COMPARISON:  05/06/2018 . FINDINGS: The enteric tube is identified within the left upper quadrant of the abdomen and projects over the expected location of the gastric fundus. Calcifications within the head of pancreas are again identified compatible with chronic pancreatitis. IMPRESSION:  1. Enteric tube tip projects over the left upper quadrant of the abdomen in the projection of the expected location of gastric fundus. Electronically Signed   By: Kerby Moors M.D.   On: 05/06/2018 16:20   Dg Abd 2 Views  Result Date: 05/06/2018 CLINICAL DATA:  58 year old female with a history of pancreatitis EXAM: ABDOMEN - 2 VIEW COMPARISON:  CT 05/02/2018, 04/23/2018 FINDINGS: Large gas and air-fluid level within the stomach on upright and supine images. Relative paucity of gas within small bowel. Relative paucity of gas within colon. Coarse calcifications in the region the pancreatic head, corresponds to findings on prior CT. No displaced fracture. No radiopaque foreign body. IMPRESSION: Large gas and fluid collection of stomach in this patient with known pancreatitis and pseudocyst, suggesting gastric outlet obstruction. Changes of chronic pancreatitis. Electronically Signed   By: Corrie Mckusick D.O.   On: 05/06/2018 12:20   Assessment / Plan: 1.Acute on chronic pancreatitis with large fluid collection:  Fluid collection measuring 20 x 10 cm, still maturing and not able to drain at this time.  Started with a lot of nausea and vomiting.  Abdominal x-ray concerning for GOO.  NGT placed on 1/22.  -Will keep NGT to LIS. -NPO. -Antiemetics. -Is going to need other means of nutrition and she is agreeable to post-pyloric Dobhoff for feedings.  Will ask IR to place once appropriate to remove NGT.   LOS: 5  days   Laban Emperor. Zehr  05/07/2018, 9:47 AM   Attending physician's note   I have taken an interval history, reviewed the chart and examined the patient. I agree with the Advanced Practitioner's note, impression and recommendations.   Abdominal pain somewhat better after NG tube placement for decompression but complains of discomfort from the NG tube. Still has significant output from NG tube. Will reassess tomorrow and plan to replace with postpyloric Dobbhoff for tube feeds.  May need  endoscopic placement or will request IR. N.p.o. Continue IV fluids  Raliegh Ip Denzil Magnuson , MD [phone]

## 2018-05-07 NOTE — Progress Notes (Signed)
Nutrition Follow-up  INTERVENTION:   D/c Ensure, MVI  TF recommendations:  Osmolite 1.5 @ 20 ml/hr, advance by 10 ml every 8 hours to goal rate of 50 ml/hr. Provides 1800 kcal, 75g protein and 914 ml H2O.  Recommend Monitor magnesium, potassium, and phosphorus daily for at least 3 days, MD to replete as needed, as pt is at risk for refeeding syndrome given poor PO intakes x 14 days.  NUTRITION DIAGNOSIS:   Increased nutrient needs related to (acute on chronic ETOH pancreatitis) as evidenced by estimated needs.  Ongoing.  GOAL:   Patient will meet greater than or equal to 90% of their needs  Not meeting.  MONITOR:   Labs, Weight trends, TF tolerance, I & O's  ASSESSMENT:   58 y.o. female with pancreatitis, diabetes mellitus type 2, hypertension presented to hospital with complaints of nausea vomiting abdominal pain.  Patient now NPO d/t continued abdominal pain and N/V on 1/22. NGT was placed and set to suction with output of ~400 ml today. Per GI, plan is for post-pyloric tube placement once NGT is removed. Pt to remain NPO. RD to leave TF recommendations when enteral feeds are initiated.  Patient has not had significant PO intakes since admission to [facility] 1/9 (14 days). Recommend monitor for refeeding syndrome once feeds are started.  No new weight has been measured since 1/15.  Medications: D5 -.9% NaCl infusion at 75 ml/hr Labs reviewed: CBGs: 120-134  Diet Order:   Diet Order            Diet NPO time specified  Diet effective now              EDUCATION NEEDS:   Education needs have been addressed  Skin:  Skin Assessment: Reviewed RN Assessment  Last BM:  1/22  Height:   Ht Readings from Last 1 Encounters:  05/02/18 5\' 2"  (1.575 m)    Weight:   Wt Readings from Last 1 Encounters:  05/02/18 78 kg    Ideal Body Weight:  50 kg  BMI:  Body mass index is 31.46 kg/m.  Estimated Nutritional Needs:   Kcal:  1500-1700  Protein:   70-80g  Fluid:  1.7L/day  Clayton Bibles, MS, RD, LDN [place] [facility] Pager: [phone] After Hours Pager: [phone]

## 2018-05-08 ENCOUNTER — Inpatient Hospital Stay (HOSPITAL_COMMUNITY): Payer: Self-pay

## 2018-05-08 LAB — GLUCOSE, CAPILLARY
Glucose-Capillary: 113 mg/dL — ABNORMAL HIGH (ref 70–99)
Glucose-Capillary: 130 mg/dL — ABNORMAL HIGH (ref 70–99)
Glucose-Capillary: 123 mg/dL — ABNORMAL HIGH (ref 70–99)

## 2018-05-08 LAB — BASIC METABOLIC PANEL
Anion gap: 8 (ref 5–15)
BUN: <5 mg/dL — ABNORMAL LOW (ref 6–20)
CHLORIDE: 103 mmol/L (ref 98–111)
CO2: 28 mmol/L (ref 22–32)
CREATININE: 0.53 mg/dL (ref 0.44–1.00)
Calcium: 8.5 mg/dL — ABNORMAL LOW (ref 8.9–10.3)
GFR calc Af Amer: >60 mL/min (ref >60)
GFR calc non Af Amer: >60 mL/min (ref >60)
Glucose, Bld: 125 mg/dL — ABNORMAL HIGH (ref 70–99)
Potassium: 3.4 mmol/L — ABNORMAL LOW (ref 3.5–5.1)
Sodium: 139 mmol/L (ref 135–145)

## 2018-05-08 LAB — CBC
HCT: 29.8 % — ABNORMAL LOW (ref 36.0–46.0)
Hemoglobin: 9.7 g/dL — ABNORMAL LOW (ref 12.0–15.0)
MCH: 27.2 pg (ref 26.0–34.0)
MCHC: 32.6 g/dL (ref 30.0–36.0)
MCV: 83.5 fL (ref 80.0–100.0)
Platelets: 486 10*3/uL — ABNORMAL HIGH (ref 150–400)
RBC: 3.57 MIL/uL — ABNORMAL LOW (ref 3.87–5.11)
RDW: 14.7 % (ref 11.5–15.5)
WBC: 11.1 10*3/uL — ABNORMAL HIGH (ref 4.0–10.5)
nRBC: 0 % (ref 0.0–0.2)

## 2018-05-08 MED ORDER — OSMOLITE 1.5 CAL PO LIQD
1000.0000 mL | ORAL | Status: AC
  Administered 2018-05-08 – 2018-05-10 (×3): 1000 mL
  Filled 2018-05-08 (×3): qty 1000

## 2018-05-08 MED ORDER — HYDROMORPHONE HCL 1 MG/ML IJ SOLN
1.0000 mg | INTRAMUSCULAR | Status: AC | PRN
  Administered 2018-05-08 (×4): 1 mg via INTRAVENOUS
  Filled 2018-05-08 (×4): qty 1

## 2018-05-08 MED ORDER — HYDROMORPHONE HCL 1 MG/ML IJ SOLN
1.0000 mg | INTRAMUSCULAR | Status: AC | PRN
  Administered 2018-05-08 – 2018-05-09 (×4): 1 mg via INTRAVENOUS
  Filled 2018-05-08 (×4): qty 1

## 2018-05-08 MED ORDER — POTASSIUM CHLORIDE 10 MEQ/100ML IV SOLN
10.0000 meq | INTRAVENOUS | Status: AC
  Administered 2018-05-08 (×4): 10 meq via INTRAVENOUS
  Filled 2018-05-08 (×3): qty 100

## 2018-05-08 NOTE — Progress Notes (Addendum)
[facility] Progress Note  CC:  ETOH pancreatitis with enlarging fluid collection  Subjective:  Feeling better today.  Seems in better spirits.  Smiling some and not tearful.  Per nursing report, I am approximating that she's had about 400 cc's out via NGT in the past 24 hours.  Objective:  Vital signs in last 24 hours: Temp:  [98.2 F (36.8 C)-99 F (37.2 C)] 98.2 F (36.8 C) (01/24 0449) Pulse Rate:  [81-102] 81 (01/24 0449) Resp:  [16-20] 20 (01/24 0449) BP: (134-156)/(81-99) 136/81 (01/24 0449) SpO2:  [93 %-95 %] 95 % (01/24 0449) Last BM Date: 05/05/18 General:  Alert, Well-developed, in NAD Heart:  Regular rate and rhythm; no murmurs Pulm:  CTAB.  No increased WOB. Abdomen:  Soft, non-distended.  BS present.  Non-tender. Extremities:  Without edema. Neurologic:  Alert and oriented x 4;  grossly normal neurologically. Psych:  Alert and cooperative. Normal mood and affect.  Intake/Output from previous day: 01/23 0701 - 01/24 0700 In: 592.5 [I.V.:592.5] Out: 900 [Emesis/NG output:900]  Lab Results: Recent Labs    05/07/18 0431 05/08/18 0330  WBC 9.7 11.1*  HGB 9.5* 9.7*  HCT 28.8* 29.8*  PLT 430* 486*   BMET Recent Labs    05/06/18 0312 05/07/18 0431 05/08/18 0330  NA 136 138 139  K 3.6 3.5 3.4*  CL 98 102 103  CO2 26 27 28   GLUCOSE 130* 139* 125*  BUN <5* 5* <5*  CREATININE 0.51 0.57 0.53  CALCIUM 8.8* 8.2* 8.5*   LFT Recent Labs    05/06/18 0312  PROT 7.8  ALBUMIN 2.7*  AST 25  ALT 10  ALKPHOS 142*  BILITOT 0.5   Dg Abd 1 View  Result Date: 05/06/2018 CLINICAL DATA:  Evaluate NG tube placement. EXAM: ABDOMEN - 1 VIEW COMPARISON:  05/06/2018 . FINDINGS: The enteric tube is identified within the left upper quadrant of the abdomen and projects over the expected location of the gastric fundus. Calcifications within the head of pancreas are again identified compatible with chronic pancreatitis. IMPRESSION: 1. Enteric tube tip  projects over the left upper quadrant of the abdomen in the projection of the expected location of gastric fundus. Electronically Signed   By: Kerby Moors M.D.   On: 05/06/2018 16:20   Dg Abd 2 Views  Result Date: 05/06/2018 CLINICAL DATA:  58 year old female with a history of pancreatitis EXAM: ABDOMEN - 2 VIEW COMPARISON:  CT 05/02/2018, 04/23/2018 FINDINGS: Large gas and air-fluid level within the stomach on upright and supine images. Relative paucity of gas within small bowel. Relative paucity of gas within colon. Coarse calcifications in the region the pancreatic head, corresponds to findings on prior CT. No displaced fracture. No radiopaque foreign body. IMPRESSION: Large gas and fluid collection of stomach in this patient with known pancreatitis and pseudocyst, suggesting gastric outlet obstruction. Changes of chronic pancreatitis. Electronically Signed   By: Corrie Mckusick D.O.   On: 05/06/2018 12:20   Assessment / Plan: 1.Acute on chronic pancreatitis with large fluid collection:Fluidcollectionmeasuring 20 x 10 cm, still maturing and not able to drain at this time. Started with a lot of nausea and vomiting. Abdominal x-ray concerning for GOO.  NGT placed on 1/22.  -Will have IR see her and place a post-pyloric Dobhoff for enteral nutrition.  NGT can be removed then as well. -NPO except ice chips. -Antiemetics.   LOS: 6 days   Laban Emperor. Zehr  05/08/2018, 9:27 AM    Attending physician's note  I have taken an interval history, reviewed the chart and examined the patient. I agree with the Advanced Practitioner's note, impression and recommendations.   Abdominal pain improving.  Decreased NG output. Will clamp NG tube. Request IR for postpyloric Dobbhoff placement for tube feeds Continue IV fluids N.p.o. except meds with ice chips/sips of water Dr. Benson Norway will be covering this weekend.  Damaris Hippo , MD [phone]

## 2018-05-08 NOTE — Progress Notes (Signed)
PROGRESS NOTE  Amanda Hahn ZTI:[id number] DOB: 05/26/1960 DOA: 05/02/2018 PCP: Patient, No Pcp Per  HPI/Recap of past 24 hours: 58 y.o.femalewith pancreatitis,diabetes mellitus type 2,hypertensionpresented to hospital with complaints of nausea vomiting abdominal pain. Of note patient was recently discharged from [facility] after treatment of pancreatitis. She was in the hospital for at least 3 days and wasdischarged home almost 3 days back. Post discharge,she continued to have nausea,vomiting and severe abdominal pain. Patientdescribes the pain as aching in sensation worse with eating and pressure to the abdomen. It is severe in intensitywith radiation to the back occasionally. Patient states that that she has not eaten in several days and has poor appetite. She had small bowel movement yesterday but is largely constipated. Due to persistent symptoms, patient decided to cometo ourhospital. Patient currently denies fever, chills or rigors. Denies any urinary urgency, frequency or dysuria. Denies cough, shortness of breath, fever or chills. No chest pain or palpitation. No dizziness lightheadedness or syncope.  ED Course:In the ED, patient hadenderness over the epigastric region with some guarding.Lipase was elevated about 165. Potassium was low at 3.2. Lipase elevated at 15.1.CT scan of the abdomen repeated today showed large pseudocyst measuring around 20 x 10 cm which was completely new compared to a CT scan done 1 week back when she was admitted at [facility]. This case was discussed with Dr. Ardis Hughs of our GI as well as Dr. Tera Helper of general surgery. No acute intervention were advised and patient was advised conservative treatment. Patient was then considered for admission to the hospital for IV fluids pain control and nutrition.  05/06/2018: Patient seen and examined at the bedside.  She reports severe diffuse abdominal pain, 10 out  of 10 and nonradiating chest and now after receiving IV Dilaudid.  Made patient n.p.o. due to slow improvement and worsening leukocytosis.  05/07/2018: Patient seen and examined at her bedside.  No acute events overnight.  States she feels better after the NG tube was placed in yesterday.  She wants the NG tube to suction out.  She states she is hungry. Lipase is trending up.  05/08/2018: Patient seen and examined at bedside.  She states she feels better today.  NG to suction output decreasing.  No new complaints.    Assessment/Plan: Principal Problem:   Peripancreatic fluid collection Active Problems:   Abdominal distension (gaseous)   Vomiting   Gastric outlet obstruction   Acute pancreatitis without infection or necrosis  Acute on chronic pancreatitis with large pseudocyst and concern for gastric outlet obstruction Independently reviewed CT abdomen and pelvis with contrast done on 05/02/2018 which revealed large pseudocyst at the esophageal hiatus compared with CT abdomen pelvis with contrast done on 04/23/2018. Independently reviewed abdomen 2 view x-ray done on 05/06/2018 which revealed evidence of gastric outlet obstruction and noted calcification of the pancrea GI and general surgery following Recommendation for reimaging several weeks later to decide if drain warranted NG tube: Suctioning in place with minimal output Continue supportive care Lipase trending up 970 on 05/07/2018 from 865 on 05/05/2018 Continue to trend lipase and repeat in the morning Plan for tube feeding per GI  Hypokalemia Potassium 3.4 Repleted with IV potassium Repeat BMP and magnesium level in the morning  Elevated alkaline phosphatase Level is trending up Continue to monitor  Resolved leukocytosis SuspectED reactive from pancreatitis Leukocytosis resolved after NG tube to suction inserted Her symptoms improved also  Normocytic anemia Appears to be dilutional from IV fluid hydration Hemoglobin  dropped  from 11.5 on 119 20-9.5 on 05/07/2018 No sign of overt bleeding Repeat CBC in the morning Baseline hemoglobin appears to be 12.5 with MCV of 82  Type 2 diabetes complicated by hypoglycemia Continue with D5 half-normal saline at 50 cc/h Latest hemoglobin A1c 8.8 on 04/23/2018 On metformin 500 mg twice daily at home Hypoglycemia has resolved, not recurrent  Hypertension Blood pressure is stable Continue amlodipine 10 mg daily, Lopressor 25 mg twice daily   Risks: High risk decompensation due to acute on chronic pancreatitis with concern for pseudocyst, multiple comorbidities and advanced age.  DVT prophylaxis: Lovenox subQ Code Status: Full Family Communication:  None at bedside Disposition Plan:  Discharge to home once general surgery and GI sign off.  Consultants:   GI  General surgery  Procedures:   NG to suction placement on 05/06/2018   Objective: Vitals:   05/07/18 1429 05/07/18 2120 05/08/18 0446 05/08/18 0449  BP: 134/82 (!) 156/99  136/81  Pulse: 89 (!) 102 85 81  Resp: 17 16  20   Temp: 98.9 F (37.2 C) 99 F (37.2 C)  98.2 F (36.8 C)  TempSrc: Oral Oral  Oral  SpO2: 94% 93% 95% 95%  Weight:      Height:        Intake/Output Summary (Last 24 hours) at 05/08/2018 1442 Last data filed at 05/08/2018 1037 Gross per 24 hour  Intake -  Output 901 ml  Net -901 ml   Filed Weights   05/02/18 1138  Weight: 78 kg    Exam:  . General: 58 y.o. year-old female well-developed well-nourished no acute distress.  Alert and oriented x3.  NG tube to suction in place with minimal output.   . Cardiovascular: Regular rate and rhythm with no rubs or gallops.  No JVD or thyromegaly . Respiratory: Clear to Auscultation with No Wheezes or Rales.  Good Inspiratory Effort. . Abdomen: Hypoactive bowel sounds.  Patient has NG tube to suction in place.  Abdomen is nondistended and soft.   . Musculoskeletal: No lower extremity edema. 2/4 pulses in all 4  extremities. Marland Kitchen Psychiatry: Mood is appropriate for condition and setting   Data Reviewed: CBC: Recent Labs  Lab 05/02/18 1218 05/03/18 0501 05/07/18 0431 05/08/18 0330  WBC 15.1* 17.2* 9.7 11.1*  HGB 12.5 11.5* 9.5* 9.7*  HCT 38.1 35.3* 28.8* 29.8*  MCV 82.1 84.2 83.2 83.5  PLT 476* 445* 430* 825*   Basic Metabolic Panel: Recent Labs  Lab 05/04/18 0449 05/05/18 0334 05/06/18 0312 05/07/18 0431 05/08/18 0330  NA 137 135 136 138 139  K 4.0 3.8 3.6 3.5 3.4*  CL 104 101 98 102 103  CO2 21* 25 26 27 28   GLUCOSE 113* 124* 130* 139* 125*  BUN 5* <5* <5* 5* <5*  CREATININE 0.58 0.52 0.51 0.57 0.53  CALCIUM 8.4* 8.4* 8.8* 8.2* 8.5*   GFR: Estimated Creatinine Clearance: 73.3 mL/min (by C-G formula based on SCr of 0.53 mg/dL). Liver Function Tests: Recent Labs  Lab 05/02/18 1218 05/03/18 0501 05/04/18 0449 05/05/18 0334 05/06/18 0312  AST 23 19 19 24 25   ALT 9 9 7 8 10   ALKPHOS 154* 122 117 129* 142*  BILITOT 1.2 1.2 0.9 0.5 0.5  PROT 8.8* 7.1 6.8 6.9 7.8  ALBUMIN 2.8* 2.5* 2.3* 2.4* 2.7*   Recent Labs  Lab 05/02/18 1218 05/04/18 0449 05/05/18 0334 05/07/18 0431  LIPASE 165* 203* 865* 970*   No results for input(s): AMMONIA in the last 168 hours. Coagulation Profile: No  results for input(s): INR, PROTIME in the last 168 hours. Cardiac Enzymes: No results for input(s): CKTOTAL, CKMB, CKMBINDEX, TROPONINI in the last 168 hours. BNP (last 3 results) No results for input(s): PROBNP in the last 8760 hours. HbA1C: No results for input(s): HGBA1C in the last 72 hours. CBG: Recent Labs  Lab 05/07/18 1206 05/07/18 1849 05/07/18 2354 05/08/18 0444 05/08/18 1201  GLUCAP 134* 110* 134* 113* 130*   Lipid Profile: No results for input(s): CHOL, HDL, LDLCALC, TRIG, CHOLHDL, LDLDIRECT in the last 72 hours. Thyroid Function Tests: No results for input(s): TSH, T4TOTAL, FREET4, T3FREE, THYROIDAB in the last 72 hours. Anemia Panel: No results for input(s):  VITAMINB12, FOLATE, FERRITIN, TIBC, IRON, RETICCTPCT in the last 72 hours. Urine analysis:    Component Value Date/Time   COLORURINE YELLOW 05/02/2018 1121   APPEARANCEUR HAZY (A) 05/02/2018 1121   LABSPEC 1.017 05/02/2018 1121   PHURINE 6.0 05/02/2018 1121   GLUCOSEU NEGATIVE 05/02/2018 1121   HGBUR SMALL (A) 05/02/2018 1121   BILIRUBINUR NEGATIVE 05/02/2018 1121   KETONESUR 20 (A) 05/02/2018 1121   PROTEINUR 30 (A) 05/02/2018 1121   UROBILINOGEN 0.2 09/03/2010 1342   NITRITE NEGATIVE 05/02/2018 1121   LEUKOCYTESUR NEGATIVE 05/02/2018 1121   Sepsis Labs: @LABRCNTIP (procalcitonin:4,lacticidven:4)  )No results found for this or any previous visit (from the past 240 hour(s)).    Studies: Dg Abd Portable 1v  Result Date: 05/08/2018 CLINICAL DATA:  Initial evaluation for feeding tube placement EXAM: PORTABLE ABDOMEN - 1 VIEW COMPARISON:  Prior radiograph from 05/06/2018 FINDINGS: [place] feeding tube in place with tip overlying the gastroduodenal region. Visualized bowel gas pattern is nonobstructive. Visualized lungs are largely clear. IMPRESSION: Corpak feeding tube in place with tip overlying the gastroduodenal region. Electronically Signed   By: Jeannine Boga M.D.   On: 05/08/2018 14:22    Scheduled Meds: . amLODipine  10 mg Oral Daily  . enoxaparin (LOVENOX) injection  40 mg Subcutaneous Q24H  . feeding supplement (ENSURE ENLIVE)  237 mL Oral TID WC  . insulin aspart  0-9 Units Subcutaneous Q6H  . metoprolol tartrate  25 mg Oral BID  . multivitamin with minerals  1 tablet Oral Daily  . sodium chloride flush  3 mL Intravenous Once    Continuous Infusions: . dextrose 5 % and 0.9% NaCl 75 mL/hr at 05/08/18 0345     LOS: 6 days     Kayleen Memos, MD [facility] Pager [phone]  If 7PM-7AM, please contact night-coverage [email or web address] Password [email or web address] 05/08/2018, 2:42 PM

## 2018-05-09 LAB — BASIC METABOLIC PANEL
Anion gap: 9 (ref 5–15)
BUN: 5 mg/dL — ABNORMAL LOW (ref 6–20)
CO2: 26 mmol/L (ref 22–32)
Calcium: 8.6 mg/dL — ABNORMAL LOW (ref 8.9–10.3)
Chloride: 103 mmol/L (ref 98–111)
Creatinine, Ser: 0.55 mg/dL (ref 0.44–1.00)
GFR calc Af Amer: >60 mL/min (ref >60)
GFR calc non Af Amer: >60 mL/min (ref >60)
Glucose, Bld: 143 mg/dL — ABNORMAL HIGH (ref 70–99)
Potassium: 3.5 mmol/L (ref 3.5–5.1)
Sodium: 138 mmol/L (ref 135–145)

## 2018-05-09 LAB — PHOSPHORUS: Phosphorus: 4.4 mg/dL (ref 2.5–4.6)

## 2018-05-09 LAB — CBC
HCT: 29.1 % — ABNORMAL LOW (ref 36.0–46.0)
Hemoglobin: 9.4 g/dL — ABNORMAL LOW (ref 12.0–15.0)
MCH: 26.9 pg (ref 26.0–34.0)
MCHC: 32.3 g/dL (ref 30.0–36.0)
MCV: 83.4 fL (ref 80.0–100.0)
Platelets: 526 10*3/uL — ABNORMAL HIGH (ref 150–400)
RBC: 3.49 MIL/uL — ABNORMAL LOW (ref 3.87–5.11)
RDW: 14.7 % (ref 11.5–15.5)
WBC: 11.3 10*3/uL — ABNORMAL HIGH (ref 4.0–10.5)
nRBC: 0 % (ref 0.0–0.2)

## 2018-05-09 LAB — MAGNESIUM: Magnesium: 1.8 mg/dL (ref 1.7–2.4)

## 2018-05-09 LAB — LIPASE, BLOOD: Lipase: 1043 U/L — ABNORMAL HIGH (ref 11–51)

## 2018-05-09 LAB — GLUCOSE, CAPILLARY
GLUCOSE-CAPILLARY: 166 mg/dL — AB (ref 70–99)
Glucose-Capillary: 153 mg/dL — ABNORMAL HIGH (ref 70–99)
Glucose-Capillary: 160 mg/dL — ABNORMAL HIGH (ref 70–99)
Glucose-Capillary: 165 mg/dL — ABNORMAL HIGH (ref 70–99)

## 2018-05-09 MED ORDER — HYDROMORPHONE HCL 1 MG/ML IJ SOLN
1.0000 mg | Freq: Once | INTRAMUSCULAR | Status: AC
  Administered 2018-05-09: 1 mg via INTRAVENOUS
  Filled 2018-05-09: qty 1

## 2018-05-09 MED ORDER — HYDROMORPHONE HCL 1 MG/ML IJ SOLN
1.0000 mg | INTRAMUSCULAR | Status: DC | PRN
  Administered 2018-05-09 – 2018-05-10 (×8): 1 mg via INTRAVENOUS
  Filled 2018-05-09 (×8): qty 1

## 2018-05-09 MED ORDER — POLYETHYLENE GLYCOL 3350 17 G PO PACK
17.0000 g | PACK | Freq: Every day | ORAL | Status: DC
  Administered 2018-05-09 – 2018-05-19 (×4): 17 g
  Filled 2018-05-09 (×10): qty 1

## 2018-05-09 MED ORDER — ORAL CARE MOUTH RINSE
15.0000 mL | Freq: Two times a day (BID) | OROMUCOSAL | Status: DC
  Administered 2018-05-09 – 2018-05-19 (×21): 15 mL via OROMUCOSAL

## 2018-05-09 MED ORDER — OXYCODONE HCL 5 MG/5ML PO SOLN
5.0000 mg | Freq: Four times a day (QID) | ORAL | Status: DC
  Administered 2018-05-09 – 2018-05-18 (×34): 5 mg
  Filled 2018-05-09 (×34): qty 5

## 2018-05-09 MED ORDER — CHLORHEXIDINE GLUCONATE 0.12 % MT SOLN
15.0000 mL | Freq: Two times a day (BID) | OROMUCOSAL | Status: DC
  Administered 2018-05-09 – 2018-05-19 (×21): 15 mL via OROMUCOSAL
  Filled 2018-05-09 (×21): qty 15

## 2018-05-09 MED ORDER — HYDROMORPHONE HCL 1 MG/ML IJ SOLN
1.0000 mg | INTRAMUSCULAR | Status: DC | PRN
  Administered 2018-05-09: 1 mg via INTRAVENOUS
  Filled 2018-05-09 (×2): qty 1

## 2018-05-09 MED ORDER — OXYCODONE HCL ER 10 MG PO T12A: 10.0000 mg | EXTENDED_RELEASE_TABLET | Freq: Two times a day (BID) | ORAL | Status: DC

## 2018-05-09 NOTE — Progress Notes (Signed)
PROGRESS NOTE  Amanda Hahn RCV:[id number] DOB: Nov 18, 1960 DOA: 05/02/2018 PCP: Patient, No Pcp Per  HPI/Recap of past 24 hours: 58 y.o.femalewith pancreatitis,diabetes mellitus type 2,hypertensionpresented to hospital with complaints of nausea vomiting abdominal pain. Of note patient was recently discharged from [facility] after treatment of pancreatitis. She was in the hospital for at least 3 days and wasdischarged home almost 3 days back. Post discharge,she continued to have nausea,vomiting and severe abdominal pain. Patientdescribes the pain as aching in sensation worse with eating and pressure to the abdomen. It is severe in intensitywith radiation to the back occasionally. Patient states that that she has not eaten in several days and has poor appetite. She had small bowel movement yesterday but is largely constipated. Due to persistent symptoms, patient decided to [facility]. Patient currently denies fever, chills or rigors. Denies any urinary urgency, frequency or dysuria. Denies cough, shortness of breath, fever or chills. No chest pain or palpitation. No dizziness lightheadedness or syncope.  ED Course:In the ED, patient hadenderness over the epigastric region with some guarding.Lipase was elevated about 165. Potassium was low at 3.2. Lipase elevated at 15.1.CT scan of the abdomen repeated today showed large pseudocyst measuring around 20 x 10 cm which was completely new compared to a CT scan done 1 week back when she was admitted at [facility]. This case was discussed with Dr. Ardis Hughs of our GI as well as Dr. Tera Helper of general surgery. No acute intervention were advised and patient was advised conservative treatment. Patient was then considered for admission to the hospital for IV fluids pain control and nutrition.  05/06/2018: Made patient n.p.o. due to slow improvement and worsening leukocytosis.  NG tube placed  in. 05/07/2018: Lipase is trending up. 05/08/2018: NG to suction output decreasing.  Cortract placed in.  Tube feeding started  05/09/2018: Patient examined at bedside.  No acute events overnight.  She has no new complaints.  She is tolerating her tube feeding well.  No nausea and no significant pain.  Had a bowel movement earlier this morning.      Assessment/Plan: Principal Problem:   Peripancreatic fluid collection Active Problems:   Abdominal distension (gaseous)   Vomiting   Gastric outlet obstruction   Acute pancreatitis without infection or necrosis  Acute on chronic pancreatitis with large pseudocyst and concern for gastric outlet obstruction Independently reviewed CT abdomen and pelvis with contrast done on 05/02/2018 which revealed large pseudocyst at the esophageal hiatus compared with CT abdomen pelvis with contrast done on 04/23/2018. Independently reviewed abdomen 2 view x-ray done on 05/06/2018 which revealed evidence of gastric outlet obstruction and noted calcification of the pancrea GI and general surgery following Recommendation for reimaging several weeks later to decide if drain warranted NG tube removed on 05/08/2018 Core track placed in by interventional radiology.  Started tube feeding on 05/08/2018 and tolerating well Continue supportive care Lipase trending up 970 on 05/07/2018 from 865 on 05/05/2018 Obtain lipase level tomorrow 05/10/2018 Phosphorus, magnesium, potassium okay Vital signs stable WBC stable Hemoglobin stable  Hypokalemia, resolving post repletion  Elevated alkaline phosphatase Obtain CMP tomorrow  Resolved leukocytosis SuspectED reactive from pancreatitis  Normocytic anemia Appears to be dilutional from IV fluid hydration No sign of overt bleeding  Type 2 diabetes complicated by hypoglycemia No recurrence Started tube feeding on 05/08/2018  Hypertension Blood pressure is stable Continue amlodipine 10 mg daily, Lopressor 25 mg twice  daily   Risks: High risk decompensation due to acute on chronic pancreatitis with  concern for pseudocyst, multiple comorbidities and advanced age.  DVT prophylaxis: Lovenox subQ Code Status: Full Family Communication:  None at bedside Disposition Plan:  Discharge to home once general surgery and GI sign off.  Consultants:   GI  General surgery  Procedures:   NG to suction placement on 05/06/2018   Objective: Vitals:   05/08/18 2045 05/09/18 0448 05/09/18 1053 05/09/18 1217  BP:  121/78 133/78 (!) 150/97  Pulse:  85 93 89  Resp: 18 16  16   Temp: 98.9 F (37.2 C) 98.8 F (37.1 C)  98.5 F (36.9 C)  TempSrc: Oral Oral  Oral  SpO2: 99% 99% 98% 96%  Weight:      Height:        Intake/Output Summary (Last 24 hours) at 05/09/2018 1517 Last data filed at 05/09/2018 1400 Gross per 24 hour  Intake 4103.65 ml  Output 5 ml  Net 4098.65 ml   Filed Weights   05/02/18 1138  Weight: 78 kg    Exam:  . General: 58 y.o. year-old female well-developed well-nourished in no acute distress.  Alert and oriented x3.  Coretract in place . Cardiovascular: Regular rate and rhythm with no rubs or gallops.  No JVD or thyromegaly noted.  Marland Kitchen Respiratory: Clear to auscultation with no wheezes or rales.  Poor inspiratory effort.   . Abdomen: Normal Bowel sounds x4 quadrants. Abdomen is nondistended and soft.   . Musculoskeletal: No lower extremity edema. 2/4 pulses in all 4 extremities. Marland Kitchen Psychiatry: Mood is appropriate for condition and setting   Data Reviewed: CBC: Recent Labs  Lab 05/03/18 0501 05/07/18 0431 05/08/18 0330 05/09/18 0531  WBC 17.2* 9.7 11.1* 11.3*  HGB 11.5* 9.5* 9.7* 9.4*  HCT 35.3* 28.8* 29.8* 29.1*  MCV 84.2 83.2 83.5 83.4  PLT 445* 430* 486* 151*   Basic Metabolic Panel: Recent Labs  Lab 05/05/18 0334 05/06/18 0312 05/07/18 0431 05/08/18 0330 05/09/18 0531  NA 135 136 138 139 138  K 3.8 3.6 3.5 3.4* 3.5  CL 101 98 102 103 103  CO2 25 26 27 28 26    GLUCOSE 124* 130* 139* 125* 143*  BUN <5* <5* 5* <5* 5*  CREATININE 0.52 0.51 0.57 0.53 0.55  CALCIUM 8.4* 8.8* 8.2* 8.5* 8.6*  MG  --   --   --   --  1.8  PHOS  --   --   --   --  4.4   GFR: Estimated Creatinine Clearance: 73.3 mL/min (by C-G formula based on SCr of 0.55 mg/dL). Liver Function Tests: Recent Labs  Lab 05/03/18 0501 05/04/18 0449 05/05/18 0334 05/06/18 0312  AST 19 19 24 25   ALT 9 7 8 10   ALKPHOS 122 117 129* 142*  BILITOT 1.2 0.9 0.5 0.5  PROT 7.1 6.8 6.9 7.8  ALBUMIN 2.5* 2.3* 2.4* 2.7*   Recent Labs  Lab 05/04/18 0449 05/05/18 0334 05/07/18 0431 05/09/18 0531  LIPASE 203* 865* 970* 1,043*   No results for input(s): AMMONIA in the last 168 hours. Coagulation Profile: No results for input(s): INR, PROTIME in the last 168 hours. Cardiac Enzymes: No results for input(s): CKTOTAL, CKMB, CKMBINDEX, TROPONINI in the last 168 hours. BNP (last 3 results) No results for input(s): PROBNP in the last 8760 hours. HbA1C: No results for input(s): HGBA1C in the last 72 hours. CBG: Recent Labs  Lab 05/08/18 0444 05/08/18 1201 05/08/18 1856 05/09/18 0018 05/09/18 1215  GLUCAP 113* 130* 123* 160* 153*   Lipid Profile: No results for  input(s): CHOL, HDL, LDLCALC, TRIG, CHOLHDL, LDLDIRECT in the last 72 hours. Thyroid Function Tests: No results for input(s): TSH, T4TOTAL, FREET4, T3FREE, THYROIDAB in the last 72 hours. Anemia Panel: No results for input(s): VITAMINB12, FOLATE, FERRITIN, TIBC, IRON, RETICCTPCT in the last 72 hours. Urine analysis:    Component Value Date/Time   COLORURINE YELLOW 05/02/2018 1121   APPEARANCEUR HAZY (A) 05/02/2018 1121   LABSPEC 1.017 05/02/2018 1121   PHURINE 6.0 05/02/2018 1121   GLUCOSEU NEGATIVE 05/02/2018 1121   HGBUR SMALL (A) 05/02/2018 1121   BILIRUBINUR NEGATIVE 05/02/2018 1121   KETONESUR 20 (A) 05/02/2018 1121   PROTEINUR 30 (A) 05/02/2018 1121   UROBILINOGEN 0.2 09/03/2010 1342   NITRITE NEGATIVE  05/02/2018 1121   LEUKOCYTESUR NEGATIVE 05/02/2018 1121   Sepsis Labs: @LABRCNTIP (procalcitonin:4,lacticidven:4)  )No results found for this or any previous visit (from the past 240 hour(s)).    Studies: No results found.  Scheduled Meds: . amLODipine  10 mg Oral Daily  . chlorhexidine  15 mL Mouth Rinse BID  . enoxaparin (LOVENOX) injection  40 mg Subcutaneous Q24H  . feeding supplement (ENSURE ENLIVE)  237 mL Oral TID WC  . feeding supplement (OSMOLITE 1.5 CAL)  1,000 mL Per Tube Q24H  . insulin aspart  0-9 Units Subcutaneous Q6H  . mouth rinse  15 mL Mouth Rinse q12n4p  . metoprolol tartrate  25 mg Oral BID  . multivitamin with minerals  1 tablet Oral Daily  . oxyCODONE  5 mg Per Tube Q6H  . polyethylene glycol  17 g Per Tube Daily  . sodium chloride flush  3 mL Intravenous Once    Continuous Infusions: . dextrose 5 % and 0.9% NaCl 75 mL/hr at 05/09/18 1120     LOS: 7 days     Kayleen Memos, MD [facility] Pager [phone]  If 7PM-7AM, please contact night-coverage [email or web address] Password [facility] 05/09/2018, 3:17 PM

## 2018-05-09 NOTE — Progress Notes (Signed)
Subjective: Currently in pain.  Objective: Vital signs in last 24 hours: Temp:  [98.8 F (37.1 C)-98.9 F (37.2 C)] 98.8 F (37.1 C) (01/25 0448) Pulse Rate:  [83-102] 93 (01/25 1053) Resp:  [16-18] 16 (01/25 0448) BP: (121-133)/(78-89) 133/78 (01/25 1053) SpO2:  [98 %-100 %] 98 % (01/25 1053) Last BM Date: 05/09/18  Intake/Output from previous day: 01/24 0701 - 01/25 0700 In: 3050.7 [I.V.:2888.7; NG/GT:162] Out: 1 [Urine:1] Intake/Output this shift: Total I/O In: 240 [NG/GT:240] Out: 1 [Urine:1]  General appearance: moderate distress GI: tener with mild palpation of the upper abdomen  Lab Results: Recent Labs    05/07/18 0431 05/08/18 0330 05/09/18 0531  WBC 9.7 11.1* 11.3*  HGB 9.5* 9.7* 9.4*  HCT 28.8* 29.8* 29.1*  PLT 430* 486* 526*   BMET Recent Labs    05/07/18 0431 05/08/18 0330 05/09/18 0531  NA 138 139 138  K 3.5 3.4* 3.5  CL 102 103 103  CO2 27 28 26   GLUCOSE 139* 125* 143*  BUN 5* <5* 5*  CREATININE 0.57 0.53 0.55  CALCIUM 8.2* 8.5* 8.6*   LFT No results for input(s): PROT, ALBUMIN, AST, ALT, ALKPHOS, BILITOT, BILIDIR, IBILI in the last 72 hours. PT/INR No results for input(s): LABPROT, INR in the last 72 hours. Hepatitis Panel No results for input(s): HEPBSAG, HCVAB, HEPAIGM, HEPBIGM in the last 72 hours. C-Diff No results for input(s): CDIFFTOX in the last 72 hours. Fecal Lactopherrin No results for input(s): FECLLACTOFRN in the last 72 hours.  Studies/Results: Dg Abd Portable 1v  Result Date: 05/08/2018 CLINICAL DATA:  Initial evaluation for feeding tube placement EXAM: PORTABLE ABDOMEN - 1 VIEW COMPARISON:  Prior radiograph from 05/06/2018 FINDINGS: Corpak feeding tube in place with tip overlying the gastroduodenal region. Visualized bowel gas pattern is nonobstructive. Visualized lungs are largely clear. IMPRESSION: Corpak feeding tube in place with tip overlying the gastroduodenal region. Electronically Signed   By: Jeannine Boga M.D.   On: 05/08/2018 14:22    Medications:  Scheduled: . amLODipine  10 mg Oral Daily  . chlorhexidine  15 mL Mouth Rinse BID  . enoxaparin (LOVENOX) injection  40 mg Subcutaneous Q24H  . feeding supplement (ENSURE ENLIVE)  237 mL Oral TID WC  . feeding supplement (OSMOLITE 1.5 CAL)  1,000 mL Per Tube Q24H  .  HYDROmorphone (DILAUDID) injection  1 mg Intravenous Once  . insulin aspart  0-9 Units Subcutaneous Q6H  . mouth rinse  15 mL Mouth Rinse q12n4p  . metoprolol tartrate  25 mg Oral BID  . multivitamin with minerals  1 tablet Oral Daily  . oxyCODONE  5 mg Per Tube Q6H  . polyethylene glycol  17 g Per Tube Daily  . sodium chloride flush  3 mL Intravenous Once   Continuous: . dextrose 5 % and 0.9% NaCl 75 mL/hr at 05/09/18 1120    Assessment/Plan: 1) Pancreatitis. 2) Pancreatic pseudocyst.   This is in pain.  Her lipase was noted to be increasing.  The pseudocyst appears to be causing a GOO, but she is tolerating her tube feeding.  Plan: 1) Patient provided an additional 1 mg of Dilaudid. 2) Continue with tube feeding. 3) Maintain NPO.  LOS: 7 days   Kareem Aul D 05/09/2018, 11:30 AM

## 2018-05-10 LAB — MAGNESIUM: Magnesium: 1.9 mg/dL (ref 1.7–2.4)

## 2018-05-10 LAB — GLUCOSE, CAPILLARY
Glucose-Capillary: 147 mg/dL — ABNORMAL HIGH (ref 70–99)
Glucose-Capillary: 156 mg/dL — ABNORMAL HIGH (ref 70–99)
Glucose-Capillary: 162 mg/dL — ABNORMAL HIGH (ref 70–99)
Glucose-Capillary: 171 mg/dL — ABNORMAL HIGH (ref 70–99)

## 2018-05-10 LAB — BASIC METABOLIC PANEL
Anion gap: 9 (ref 5–15)
BUN: 6 mg/dL (ref 6–20)
CO2: 26 mmol/L (ref 22–32)
Calcium: 8.5 mg/dL — ABNORMAL LOW (ref 8.9–10.3)
Chloride: 102 mmol/L (ref 98–111)
Creatinine, Ser: 0.66 mg/dL (ref 0.44–1.00)
GFR calc Af Amer: >60 mL/min (ref >60)
GFR calc non Af Amer: >60 mL/min (ref >60)
Glucose, Bld: 214 mg/dL — ABNORMAL HIGH (ref 70–99)
POTASSIUM: 3.8 mmol/L (ref 3.5–5.1)
Sodium: 137 mmol/L (ref 135–145)

## 2018-05-10 LAB — CBC
HCT: 30.1 % — ABNORMAL LOW (ref 36.0–46.0)
HEMOGLOBIN: 9.6 g/dL — AB (ref 12.0–15.0)
MCH: 27 pg (ref 26.0–34.0)
MCHC: 31.9 g/dL (ref 30.0–36.0)
MCV: 84.6 fL (ref 80.0–100.0)
Platelets: 614 10*3/uL — ABNORMAL HIGH (ref 150–400)
RBC: 3.56 MIL/uL — ABNORMAL LOW (ref 3.87–5.11)
RDW: 14.8 % (ref 11.5–15.5)
WBC: 10.8 10*3/uL — ABNORMAL HIGH (ref 4.0–10.5)
nRBC: 0 % (ref 0.0–0.2)

## 2018-05-10 LAB — PHOSPHORUS: PHOSPHORUS: 3.8 mg/dL (ref 2.5–4.6)

## 2018-05-10 LAB — LIPASE, BLOOD: Lipase: 844 U/L — ABNORMAL HIGH (ref 11–51)

## 2018-05-10 MED ORDER — LACTATED RINGERS IV SOLN
INTRAVENOUS | Status: DC
  Administered 2018-05-10 – 2018-05-11 (×2): 1000 mL via INTRAVENOUS
  Administered 2018-05-11 – 2018-05-16 (×10): via INTRAVENOUS
  Administered 2018-05-17: 1000 mL via INTRAVENOUS
  Administered 2018-05-18 – 2018-05-19 (×2): via INTRAVENOUS

## 2018-05-10 MED ORDER — HYDROMORPHONE HCL 1 MG/ML IJ SOLN
1.0000 mg | INTRAMUSCULAR | Status: DC | PRN
  Administered 2018-05-10 – 2018-05-11 (×2): 2 mg via INTRAVENOUS
  Administered 2018-05-11 (×3): 1 mg via INTRAVENOUS
  Administered 2018-05-11 – 2018-05-12 (×9): 2 mg via INTRAVENOUS
  Administered 2018-05-13: 1 mg via INTRAVENOUS
  Administered 2018-05-13: 2 mg via INTRAVENOUS
  Administered 2018-05-13 – 2018-05-14 (×6): 1 mg via INTRAVENOUS
  Administered 2018-05-14 (×2): 2 mg via INTRAVENOUS
  Administered 2018-05-14 (×2): 1 mg via INTRAVENOUS
  Administered 2018-05-15: 2 mg via INTRAVENOUS
  Administered 2018-05-15: 1 mg via INTRAVENOUS
  Administered 2018-05-15 (×2): 2 mg via INTRAVENOUS
  Administered 2018-05-15 (×2): 1 mg via INTRAVENOUS
  Administered 2018-05-16 – 2018-05-17 (×8): 2 mg via INTRAVENOUS
  Administered 2018-05-17: 1 mg via INTRAVENOUS
  Administered 2018-05-17 (×2): 2 mg via INTRAVENOUS
  Administered 2018-05-18 (×2): 1 mg via INTRAVENOUS
  Filled 2018-05-10: qty 2
  Filled 2018-05-10: qty 1
  Filled 2018-05-10 (×3): qty 2
  Filled 2018-05-10 (×2): qty 1
  Filled 2018-05-10 (×3): qty 2
  Filled 2018-05-10 (×2): qty 1
  Filled 2018-05-10 (×3): qty 2
  Filled 2018-05-10 (×2): qty 1
  Filled 2018-05-10: qty 2
  Filled 2018-05-10: qty 1
  Filled 2018-05-10 (×2): qty 2
  Filled 2018-05-10: qty 1
  Filled 2018-05-10 (×6): qty 2
  Filled 2018-05-10: qty 1
  Filled 2018-05-10: qty 2
  Filled 2018-05-10: qty 1
  Filled 2018-05-10 (×4): qty 2
  Filled 2018-05-10: qty 1
  Filled 2018-05-10 (×3): qty 2
  Filled 2018-05-10 (×2): qty 1
  Filled 2018-05-10 (×3): qty 2
  Filled 2018-05-10: qty 1

## 2018-05-10 MED ORDER — FENTANYL CITRATE (PF) 100 MCG/2ML IJ SOLN
25.0000 ug | INTRAMUSCULAR | Status: DC | PRN
  Administered 2018-05-10: 25 ug via INTRAVENOUS
  Filled 2018-05-10: qty 2

## 2018-05-10 NOTE — Progress Notes (Signed)
PROGRESS NOTE  Amanda Hahn FUX:[id number] DOB: 03-14-1961 DOA: 05/02/2018 PCP: Patient, No Pcp Per  HPI/Recap of past 24 hours: 58 y.o.femalewith pancreatitis,diabetes mellitus type 2,hypertensionpresented to hospital with complaints of nausea vomiting abdominal pain. Of note patient was recently discharged from [facility] after treatment of pancreatitis. She was in the hospital for at least 3 days and wasdischarged home almost 3 days back. Post discharge,she continued to have nausea,vomiting and severe abdominal pain. Patientdescribes the pain as aching in sensation worse with eating and pressure to the abdomen. It is severe in intensitywith radiation to the back occasionally. Patient states that that she has not eaten in several days and has poor appetite. She had small bowel movement yesterday but is largely constipated. Due to persistent symptoms, patient decided to cometo ourhospital. Patient currently denies fever, chills or rigors. Denies any urinary urgency, frequency or dysuria. Denies cough, shortness of breath, fever or chills. No chest pain or palpitation. No dizziness lightheadedness or syncope.  ED Course:In the ED, patient hadenderness over the epigastric region with some guarding.Lipase was elevated about 165. Potassium was low at 3.2. Lipase elevated at 15.1.CT scan of the abdomen repeated today showed large pseudocyst measuring around 20 x 10 cm which was completely new compared to a CT scan done 1 week back when she was admitted at [facility]. This case was discussed with Dr. Ardis Hughs of our GI as well as Dr. Tera Helper of general surgery. No acute intervention were advised and patient was advised conservative treatment. Patient was then considered for admission to the hospital for IV fluids pain control and nutrition.  05/06/2018: Made patient n.p.o. due to slow improvement and worsening leukocytosis.  NG tube placed  in. 05/07/2018: Lipase is trending up. 05/08/2018: NG to suction output decreasing.  Cortract placed in.  Tube feeding started 05/09/2018: She is tolerating her tube feeding well.    05/10/2018: Patient seen and examined at bedside.  States the pain in her abdomen is not as bad as it used to be.  She is tolerating tube feeding well with no nausea.  Lipase trending down.      Assessment/Plan: Principal Problem:   Peripancreatic fluid collection Active Problems:   Abdominal distension (gaseous)   Vomiting   Gastric outlet obstruction   Acute pancreatitis without infection or necrosis  Acute on chronic pancreatitis with large pseudocyst and concern for gastric outlet obstruction Independently reviewed CT abdomen and pelvis with contrast done on 05/02/2018 which revealed large pseudocyst at the esophageal hiatus compared with CT abdomen pelvis with contrast done on 04/23/2018. Independently reviewed abdomen 2 view x-ray done on 05/06/2018 which revealed evidence of gastric outlet obstruction and noted calcification of the pancrea GI and general surgery following Recommendation for reimaging several weeks later to decide if drain warranted NG tube removed on 05/08/2018 Core track placed in by interventional radiology.  Started tube feeding on 05/08/2018 and tolerating well Continue supportive care Lipase trending down 844 from 1043 Phosphorus, magnesium, potassium okay Vital signs stable WBC stable Hemoglobin stable  Hypokalemia, resolving post repletion  Elevated alkaline phosphatase Obtain CMP tomorrow  Resolved leukocytosis SuspectED reactive from pancreatitis  Normocytic anemia Appears to be dilutional from IV fluid hydration No sign of overt bleeding  Type 2 diabetes complicated by hypoglycemia No recurrence Started tube feeding on 05/08/2018  Hypertension Blood pressure is stable Continue amlodipine 10 mg daily, Lopressor 25 mg twice daily   Risks: High risk  decompensation due to acute on chronic pancreatitis with concern  for pseudocyst, multiple comorbidities and advanced age.  DVT prophylaxis: Lovenox subQ Code Status: Full Family Communication:  None at bedside Disposition Plan:  Discharge to home once general surgery and GI sign off.  Consultants:   GI  General surgery  Procedures:   NG to suction placement on 05/06/2018 and removed on 05/08/18  Cortrack tube feeding placed on 05/08/18 by IR   Objective: Vitals:   05/09/18 1053 05/09/18 1217 05/09/18 2114 05/10/18 0517  BP: 133/78 (!) 150/97 (!) 149/87 137/76  Pulse: 93 89 (!) 108 91  Resp:  16 14 14   Temp:  98.5 F (36.9 C) 98.4 F (36.9 C) 98.5 F (36.9 C)  TempSrc:  Oral Oral Oral  SpO2: 98% 96% 95% 96%  Weight:      Height:        Intake/Output Summary (Last 24 hours) at 05/10/2018 1114 Last data filed at 05/10/2018 0600 Gross per 24 hour  Intake 1937.98 ml  Output 5 ml  Net 1932.98 ml   Filed Weights   05/02/18 1138  Weight: 78 kg    Exam:  . General: 58 y.o. year-old female well-developed well-nourished in no acute distress.  Alert and oriented x3.  Cortract in place.   . Cardiovascular: Regular rate and rhythm with no rubs or gallops.  No JVD or thyromegaly noted.  Respiratory: Clear to Auscultation with No Wheezes or Rales.  Poor Inspiratory Effort.   . Abdomen: Soft normal bowel sounds x4 quadrants.  Nontender. . Musculoskeletal: No lower extremity edema. 2/4 pulses in all 4 extremities. Marland Kitchen Psychiatry: Mood is appropriate for condition and setting   Data Reviewed: CBC: Recent Labs  Lab 05/07/18 0431 05/08/18 0330 05/09/18 0531 05/10/18 0505  WBC 9.7 11.1* 11.3* 10.8*  HGB 9.5* 9.7* 9.4* 9.6*  HCT 28.8* 29.8* 29.1* 30.1*  MCV 83.2 83.5 83.4 84.6  PLT 430* 486* 526* 025*   Basic Metabolic Panel: Recent Labs  Lab 05/06/18 0312 05/07/18 0431 05/08/18 0330 05/09/18 0531 05/10/18 0505  NA 136 138 139 138 137  K 3.6 3.5 3.4* 3.5 3.8  CL  98 102 103 103 102  CO2 26 27 28 26 26   GLUCOSE 130* 139* 125* 143* 214*  BUN <5* 5* <5* 5* 6  CREATININE 0.51 0.57 0.53 0.55 0.66  CALCIUM 8.8* 8.2* 8.5* 8.6* 8.5*  MG  --   --   --  1.8 1.9  PHOS  --   --   --  4.4 3.8   GFR: Estimated Creatinine Clearance: 73.3 mL/min (by C-G formula based on SCr of 0.66 mg/dL). Liver Function Tests: Recent Labs  Lab 05/04/18 0449 05/05/18 0334 05/06/18 0312  AST 19 24 25   ALT 7 8 10   ALKPHOS 117 129* 142*  BILITOT 0.9 0.5 0.5  PROT 6.8 6.9 7.8  ALBUMIN 2.3* 2.4* 2.7*   Recent Labs  Lab 05/04/18 0449 05/05/18 0334 05/07/18 0431 05/09/18 0531 05/10/18 0505  LIPASE 203* 865* 970* 1,043* 844*   No results for input(s): AMMONIA in the last 168 hours. Coagulation Profile: No results for input(s): INR, PROTIME in the last 168 hours. Cardiac Enzymes: No results for input(s): CKTOTAL, CKMB, CKMBINDEX, TROPONINI in the last 168 hours. BNP (last 3 results) No results for input(s): PROBNP in the last 8760 hours. HbA1C: No results for input(s): HGBA1C in the last 72 hours. CBG: Recent Labs  Lab 05/09/18 0018 05/09/18 1215 05/09/18 1819 05/09/18 2355 05/10/18 0535  GLUCAP 160* 153* 166* 165* 171*   Lipid Profile: No  results for input(s): CHOL, HDL, LDLCALC, TRIG, CHOLHDL, LDLDIRECT in the last 72 hours. Thyroid Function Tests: No results for input(s): TSH, T4TOTAL, FREET4, T3FREE, THYROIDAB in the last 72 hours. Anemia Panel: No results for input(s): VITAMINB12, FOLATE, FERRITIN, TIBC, IRON, RETICCTPCT in the last 72 hours. Urine analysis:    Component Value Date/Time   COLORURINE YELLOW 05/02/2018 1121   APPEARANCEUR HAZY (A) 05/02/2018 1121   LABSPEC 1.017 05/02/2018 1121   PHURINE 6.0 05/02/2018 1121   GLUCOSEU NEGATIVE 05/02/2018 1121   HGBUR SMALL (A) 05/02/2018 1121   BILIRUBINUR NEGATIVE 05/02/2018 1121   KETONESUR 20 (A) 05/02/2018 1121   PROTEINUR 30 (A) 05/02/2018 1121   UROBILINOGEN 0.2 09/03/2010 1342   NITRITE  NEGATIVE 05/02/2018 1121   LEUKOCYTESUR NEGATIVE 05/02/2018 1121   Sepsis Labs: @LABRCNTIP (procalcitonin:4,lacticidven:4)  )No results found for this or any previous visit (from the past 240 hour(s)).    Studies: No results found.  Scheduled Meds: . amLODipine  10 mg Oral Daily  . chlorhexidine  15 mL Mouth Rinse BID  . enoxaparin (LOVENOX) injection  40 mg Subcutaneous Q24H  . feeding supplement (ENSURE ENLIVE)  237 mL Oral TID WC  . feeding supplement (OSMOLITE 1.5 CAL)  1,000 mL Per Tube Q24H  . insulin aspart  0-9 Units Subcutaneous Q6H  . mouth rinse  15 mL Mouth Rinse q12n4p  . metoprolol tartrate  25 mg Oral BID  . multivitamin with minerals  1 tablet Oral Daily  . oxyCODONE  5 mg Per Tube Q6H  . polyethylene glycol  17 g Per Tube Daily  . sodium chloride flush  3 mL Intravenous Once    Continuous Infusions: . dextrose 5 % and 0.9% NaCl 75 mL/hr at 05/10/18 0600     LOS: 8 days     Kayleen Memos, MD [facility] Pager [phone]  If 7PM-7AM, please contact night-coverage [email or web address] Password [email or web address] 05/10/2018, 11:14 AM

## 2018-05-10 NOTE — Progress Notes (Signed)
Dr. Nevada Crane text to have pain medication changed, did not hear back from her.

## 2018-05-10 NOTE — Progress Notes (Signed)
Subjective: Feeling better.  Objective: Vital signs in last 24 hours: Temp:  [98.4 F (36.9 C)-98.5 F (36.9 C)] 98.5 F (36.9 C) (01/26 0517) Pulse Rate:  [91-108] 93 (01/26 1049) Resp:  [14] 14 (01/26 0517) BP: (137-149)/(76-91) 141/91 (01/26 1049) SpO2:  [95 %-96 %] 96 % (01/26 0517) Last BM Date: 05/10/18(small)  Intake/Output from previous day: 01/25 0701 - 01/26 0700 In: 2178 [P.O.:120; I.V.:1605; NG/GT:453] Out: 7 [Urine:6; Stool:1] Intake/Output this shift: No intake/output data recorded.  General appearance: alert and no distress GI: distended, tender with mild palpation.  Lab Results: Recent Labs    05/08/18 0330 05/09/18 0531 05/10/18 0505  WBC 11.1* 11.3* 10.8*  HGB 9.7* 9.4* 9.6*  HCT 29.8* 29.1* 30.1*  PLT 486* 526* 614*   BMET Recent Labs    05/08/18 0330 05/09/18 0531 05/10/18 0505  NA 139 138 137  K 3.4* 3.5 3.8  CL 103 103 102  CO2 28 26 26   GLUCOSE 125* 143* 214*  BUN <5* 5* 6  CREATININE 0.53 0.55 0.66  CALCIUM 8.5* 8.6* 8.5*   LFT No results for input(s): PROT, ALBUMIN, AST, ALT, ALKPHOS, BILITOT, BILIDIR, IBILI in the last 72 hours. PT/INR No results for input(s): LABPROT, INR in the last 72 hours. Hepatitis Panel No results for input(s): HEPBSAG, HCVAB, HEPAIGM, HEPBIGM in the last 72 hours. C-Diff No results for input(s): CDIFFTOX in the last 72 hours. Fecal Lactopherrin No results for input(s): FECLLACTOFRN in the last 72 hours.  Studies/Results: Dg Abd Portable 1v  Result Date: 05/08/2018 CLINICAL DATA:  Initial evaluation for feeding tube placement EXAM: PORTABLE ABDOMEN - 1 VIEW COMPARISON:  Prior radiograph from 05/06/2018 FINDINGS: Corpak feeding tube in place with tip overlying the gastroduodenal region. Visualized bowel gas pattern is nonobstructive. Visualized lungs are largely clear. IMPRESSION: Corpak feeding tube in place with tip overlying the gastroduodenal region. Electronically Signed   By: Jeannine Boga  M.Hahn.   On: 05/08/2018 14:22    Medications:  Scheduled: . amLODipine  10 mg Oral Daily  . chlorhexidine  15 mL Mouth Rinse BID  . enoxaparin (LOVENOX) injection  40 mg Subcutaneous Q24H  . feeding supplement (ENSURE ENLIVE)  237 mL Oral TID WC  . feeding supplement (OSMOLITE 1.5 CAL)  1,000 mL Per Tube Q24H  . insulin aspart  0-9 Units Subcutaneous Q6H  . mouth rinse  15 mL Mouth Rinse q12n4p  . metoprolol tartrate  25 mg Oral BID  . multivitamin with minerals  1 tablet Oral Daily  . oxyCODONE  5 mg Per Tube Q6H  . polyethylene glycol  17 g Per Tube Daily  . sodium chloride flush  3 mL Intravenous Once   Continuous: . dextrose 5 % and 0.9% NaCl 75 mL/hr at 05/10/18 0600    Assessment/Plan: 1) Pancreatitis. 2) Pancreatic pseudocyst.   She is slowly progressing.  No new recommendations.  Plan: 1) Continue with tube feeding. 2) Continue with pain control.  LOS: 8 days   Amanda Hahn 05/10/2018, 12:51 PM

## 2018-05-11 LAB — BASIC METABOLIC PANEL
Anion gap: 8 (ref 5–15)
BUN: 9 mg/dL (ref 6–20)
CO2: 27 mmol/L (ref 22–32)
Calcium: 8.9 mg/dL (ref 8.9–10.3)
Chloride: 101 mmol/L (ref 98–111)
Creatinine, Ser: 0.62 mg/dL (ref 0.44–1.00)
GFR calc Af Amer: >60 mL/min (ref >60)
GFR calc non Af Amer: >60 mL/min (ref >60)
Glucose, Bld: 163 mg/dL — ABNORMAL HIGH (ref 70–99)
Potassium: 4 mmol/L (ref 3.5–5.1)
SODIUM: 136 mmol/L (ref 135–145)

## 2018-05-11 LAB — GLUCOSE, CAPILLARY
GLUCOSE-CAPILLARY: 148 mg/dL — AB (ref 70–99)
Glucose-Capillary: 120 mg/dL — ABNORMAL HIGH (ref 70–99)
Glucose-Capillary: 138 mg/dL — ABNORMAL HIGH (ref 70–99)
Glucose-Capillary: 148 mg/dL — ABNORMAL HIGH (ref 70–99)
Glucose-Capillary: 166 mg/dL — ABNORMAL HIGH (ref 70–99)

## 2018-05-11 LAB — MAGNESIUM: Magnesium: 2 mg/dL (ref 1.7–2.4)

## 2018-05-11 LAB — CBC
HCT: 31.4 % — ABNORMAL LOW (ref 36.0–46.0)
Hemoglobin: 10 g/dL — ABNORMAL LOW (ref 12.0–15.0)
MCH: 27 pg (ref 26.0–34.0)
MCHC: 31.8 g/dL (ref 30.0–36.0)
MCV: 84.6 fL (ref 80.0–100.0)
NRBC: 0 % (ref 0.0–0.2)
PLATELETS: 655 10*3/uL — AB (ref 150–400)
RBC: 3.71 MIL/uL — ABNORMAL LOW (ref 3.87–5.11)
RDW: 14.8 % (ref 11.5–15.5)
WBC: 11.5 10*3/uL — ABNORMAL HIGH (ref 4.0–10.5)

## 2018-05-11 LAB — PHOSPHORUS: Phosphorus: 3.8 mg/dL (ref 2.5–4.6)

## 2018-05-11 MED ORDER — JEVITY 1.2 CAL PO LIQD: 1000.0000 mL | ORAL | Status: DC

## 2018-05-11 MED ORDER — OSMOLITE 1.5 CAL PO LIQD
1000.0000 mL | ORAL | Status: DC
  Filled 2018-05-11: qty 1000

## 2018-05-11 MED ORDER — OSMOLITE 1.5 CAL PO LIQD
1000.0000 mL | ORAL | Status: DC
  Administered 2018-05-11 – 2018-05-17 (×8): 1000 mL
  Filled 2018-05-11 (×9): qty 1000

## 2018-05-11 NOTE — Progress Notes (Signed)
PROGRESS NOTE  Amanda Hahn YOV:[id number] DOB: Jan 27, 1961 DOA: 05/02/2018 PCP: Patient, No Pcp Per  HPI/Recap of past 24 hours: 58 y.o.femalewith pancreatitis,diabetes mellitus type 2,hypertensionpresented to hospital with complaints of nausea vomiting abdominal pain. Of note patient was recently discharged from [facility] after treatment of pancreatitis. She was in the hospital for at least 3 days and wasdischarged home almost 3 days back. Post discharge,she continued to have nausea,vomiting and severe abdominal pain. Patientdescribes the pain as aching in sensation worse with eating and pressure to the abdomen. It is severe in intensitywith radiation to the back occasionally. Patient states that that she has not eaten in several days and has poor appetite. She had small bowel movement yesterday but is largely constipated. Due to persistent symptoms, patient decided to cometo ourhospital. Patient currently denies fever, chills or rigors. Denies any urinary urgency, frequency or dysuria. Denies cough, shortness of breath, fever or chills. No chest pain or palpitation. No dizziness lightheadedness or syncope.  ED Course:In the ED, patient hadenderness over the epigastric region with some guarding.Lipase was elevated about 165. Potassium was low at 3.2. Lipase elevated at 15.1.CT scan of the abdomen repeated today showed large pseudocyst measuring around 20 x 10 cm which was completely new compared to a CT scan done 1 week back when she was admitted at [facility]. This case was discussed with Dr. Ardis Hughs of our GI as well as Dr. Tera Helper of general surgery. No acute intervention were advised and patient was advised conservative treatment. Patient was then considered for admission to the hospital for IV fluids pain control and nutrition.  05/06/2018: Made patient n.p.o. due to slow improvement and worsening leukocytosis.  NG tube placed  in. 05/07/2018: Lipase is trending up. 05/08/2018: NG to suction output decreasing.  Cortract placed in.  Tube feeding started 05/09/2018: She is tolerating her tube feeding well.   05/10/2018: States the pain in her abdomen is not as bad as it used to be.  She is tolerating tube feeding well with no nausea.  Lipase trending down.  05/11/2018: Patient seen and examined at bedside.  No acute events overnight.  Tolerating tube feeding well.    Assessment/Plan: Principal Problem:   Peripancreatic fluid collection Active Problems:   Abdominal distension (gaseous)   Vomiting   Gastric outlet obstruction   Acute pancreatitis without infection or necrosis  Acute on chronic pancreatitis with large pseudocyst and concern for gastric outlet obstruction Independently reviewed CT abdomen and pelvis with contrast done on 05/02/2018 which revealed large pseudocyst at the esophageal hiatus compared with CT abdomen pelvis with contrast done on 04/23/2018. Independently reviewed abdomen 2 view x-ray done on 05/06/2018 which revealed evidence of gastric outlet obstruction and noted calcification of the pancrea GI and general surgery following Recommendation for reimaging several weeks later to decide if drain warranted NG tube removed on 05/08/2018 Core track placed in by interventional radiology.  Started tube feeding on 05/08/2018 and tolerating well Continue supportive care Lipase trending down 844 from 1043 Phosphorus, magnesium, potassium okay Vital signs stable WBC stable Hemoglobin stable Repeat lipase level tomorrow 05/12/2018  Hypokalemia, resolving post repletion Reviewed labs done today  Elevated alkaline phosphatase Obtain CMP tomorrow  Resolved leukocytosis SuspectED reactive from pancreatitis  Normocytic anemia Appears to be dilutional from IV fluid hydration No sign of overt bleeding  Type 2 diabetes complicated by hypoglycemia No recurrence Started tube feeding on  05/08/2018  Hypertension Blood pressure is stable Continue amlodipine 10 mg daily, Lopressor 25  mg twice daily     DVT prophylaxis: Lovenox subQ Code Status: Full Family Communication:  None at bedside Disposition Plan:  Discharge to home once general surgery and GI sign off.  Consultants:   GI  General surgery  Procedures:   NG to suction placement on 05/06/2018 and removed on 05/08/18  Cortrack tube feeding placed on 05/08/18 by IR   Objective: Vitals:   05/11/18 0535 05/11/18 1107 05/11/18 1109 05/11/18 1337  BP: 119/81 138/81 138/81 117/64  Pulse: 88 (!) 101  93  Resp: 20   16  Temp: 98.4 F (36.9 C)   98.7 F (37.1 C)  TempSrc: Oral   Oral  SpO2: 94%   96%  Weight:      Height:        Intake/Output Summary (Last 24 hours) at 05/11/2018 1627 Last data filed at 05/11/2018 1252 Gross per 24 hour  Intake 2911 ml  Output -  Net 2911 ml   Filed Weights   05/02/18 1138  Weight: 78 kg    Exam:  . General: 58 y.o. year-old female well-developed well-nourished in no acute distress.  Alert and oriented x3.  Cortract is in place.   . Cardiovascular: Regular rate and rhythm with no rubs or gallops.  No JVD or thyromegaly noted . Respiratory: Clear to auscultation with no wheezes or RALES.  Poor inspiratory effort. . Abdomen: Soft normal bowel sounds x4 quadrants.  Nontender. . Musculoskeletal: No lower extremity edema. 2/4 pulses in all 4 extremities. Marland Kitchen Psychiatry: Mood is appropriate for condition and setting   Data Reviewed: CBC: Recent Labs  Lab 05/07/18 0431 05/08/18 0330 05/09/18 0531 05/10/18 0505 05/11/18 0458  WBC 9.7 11.1* 11.3* 10.8* 11.5*  HGB 9.5* 9.7* 9.4* 9.6* 10.0*  HCT 28.8* 29.8* 29.1* 30.1* 31.4*  MCV 83.2 83.5 83.4 84.6 84.6  PLT 430* 486* 526* 614* 865*   Basic Metabolic Panel: Recent Labs  Lab 05/07/18 0431 05/08/18 0330 05/09/18 0531 05/10/18 0505 05/11/18 0458  NA 138 139 138 137 136  K 3.5 3.4* 3.5 3.8 4.0  CL 102  103 103 102 101  CO2 27 28 26 26 27   GLUCOSE 139* 125* 143* 214* 163*  BUN 5* <5* 5* 6 9  CREATININE 0.57 0.53 0.55 0.66 0.62  CALCIUM 8.2* 8.5* 8.6* 8.5* 8.9  MG  --   --  1.8 1.9 2.0  PHOS  --   --  4.4 3.8 3.8   GFR: Estimated Creatinine Clearance: 73.3 mL/min (by C-G formula based on SCr of 0.62 mg/dL). Liver Function Tests: Recent Labs  Lab 05/05/18 0334 05/06/18 0312  AST 24 25  ALT 8 10  ALKPHOS 129* 142*  BILITOT 0.5 0.5  PROT 6.9 7.8  ALBUMIN 2.4* 2.7*   Recent Labs  Lab 05/05/18 0334 05/07/18 0431 05/09/18 0531 05/10/18 0505  LIPASE 865* 970* 1,043* 844*   No results for input(s): AMMONIA in the last 168 hours. Coagulation Profile: No results for input(s): INR, PROTIME in the last 168 hours. Cardiac Enzymes: No results for input(s): CKTOTAL, CKMB, CKMBINDEX, TROPONINI in the last 168 hours. BNP (last 3 results) No results for input(s): PROBNP in the last 8760 hours. HbA1C: No results for input(s): HGBA1C in the last 72 hours. CBG: Recent Labs  Lab 05/10/18 1204 05/10/18 1722 05/10/18 2356 05/11/18 0600 05/11/18 1325  GLUCAP 156* 162* 147* 148* 138*   Lipid Profile: No results for input(s): CHOL, HDL, LDLCALC, TRIG, CHOLHDL, LDLDIRECT in the last 72 hours. Thyroid Function  Tests: No results for input(s): TSH, T4TOTAL, FREET4, T3FREE, THYROIDAB in the last 72 hours. Anemia Panel: No results for input(s): VITAMINB12, FOLATE, FERRITIN, TIBC, IRON, RETICCTPCT in the last 72 hours. Urine analysis:    Component Value Date/Time   COLORURINE YELLOW 05/02/2018 1121   APPEARANCEUR HAZY (A) 05/02/2018 1121   LABSPEC 1.017 05/02/2018 1121   PHURINE 6.0 05/02/2018 1121   GLUCOSEU NEGATIVE 05/02/2018 1121   HGBUR SMALL (A) 05/02/2018 1121   BILIRUBINUR NEGATIVE 05/02/2018 1121   KETONESUR 20 (A) 05/02/2018 1121   PROTEINUR 30 (A) 05/02/2018 1121   UROBILINOGEN 0.2 09/03/2010 1342   NITRITE NEGATIVE 05/02/2018 1121   LEUKOCYTESUR NEGATIVE 05/02/2018  1121   Sepsis Labs: @LABRCNTIP (procalcitonin:4,lacticidven:4)  )No results found for this or any previous visit (from the past 240 hour(s)).    Studies: No results found.  Scheduled Meds: . amLODipine  10 mg Oral Daily  . chlorhexidine  15 mL Mouth Rinse BID  . enoxaparin (LOVENOX) injection  40 mg Subcutaneous Q24H  . feeding supplement (OSMOLITE 1.5 CAL)  1,000 mL Per Tube Q24H  . insulin aspart  0-9 Units Subcutaneous Q6H  . mouth rinse  15 mL Mouth Rinse q12n4p  . metoprolol tartrate  25 mg Oral BID  . multivitamin with minerals  1 tablet Oral Daily  . oxyCODONE  5 mg Per Tube Q6H  . polyethylene glycol  17 g Per Tube Daily  . sodium chloride flush  3 mL Intravenous Once    Continuous Infusions: . lactated ringers 75 mL/hr at 05/11/18 0509     LOS: 9 days     Kayleen Memos, MD [facility] Pager [phone]  If 7PM-7AM, please contact night-coverage [email or web address] Password [facility] 05/11/2018, 4:27 PM

## 2018-05-11 NOTE — Progress Notes (Addendum)
Nutrition Follow-up  INTERVENTION:   Resume TF: Osmolite 1.5 @ 30 ml/hr via NGT, advance by 10 ml every 8 hours to goal rate of 50 ml/hr x 24 hours.  NUTRITION DIAGNOSIS:   Increased nutrient needs related to (acute on chronic ETOH pancreatitis) as evidenced by estimated needs.  Ongoing.  GOAL:   Patient will meet greater than or equal to 90% of their needs  Progressing.  MONITOR:   Labs, Weight trends, TF tolerance, I & O's   REASON FOR ASSESSMENT:   Consult Tube feeding initiation and management.  ASSESSMENT:   58 y.o. female with pancreatitis, diabetes mellitus type 2, hypertension presented to hospital with complaints of nausea vomiting abdominal pain. 1/24: Cortrak placement -post-pyloric via IR  Patient's tube feeding was initiated following tube placement and reached goal rate of 50 ml/hr on 1/25. TF was then turned down to 20 ml/hr d/t ordering error. RD was consulted for management today and ordered TF recommendations.  Pt states she was tolerating the TF fine with no issue. Pt more eager to have diet progressed, would like more than ice chips.    No new weights recorded. Daily weights ordered with TF protocol.  Medications: Lactated Ringers infusion at 75 ml/hr Labs reviewed: CBGs: 138-148  Diet Order:   Diet Order            Diet NPO time specified Except for: Ice Chips  Diet effective now              EDUCATION NEEDS:   Education needs have been addressed  Skin:  Skin Assessment: Reviewed RN Assessment  Last BM:  1/26  Height:   Ht Readings from Last 1 Encounters:  05/02/18 5\' 2"  (1.575 m)    Weight:   Wt Readings from Last 1 Encounters:  05/02/18 78 kg    Ideal Body Weight:  50 kg  BMI:  Body mass index is 31.46 kg/m.  Estimated Nutritional Needs:   Kcal:  1500-1700  Protein:  70-80g  Fluid:  1.7L/day  Clayton Bibles, MS, RD, LDN [facility] Pager: [phone] After Hours Pager: [phone]

## 2018-05-11 NOTE — Progress Notes (Signed)
Patient has ambulated in the hall a couple times.

## 2018-05-11 NOTE — Progress Notes (Signed)
     [facility] Progress Note  CC:  Pancreatitis, large pancreatic pseudocyst  Subjective: She is feeling better today. Intermittent upper abdominal pain persists which is relieved by taking Dilaudid 2mg  IV Q 3 hrs PRN. No nausea. Osmolite was infusing at 50cc/hr but was dc'd this morning. To restart feedings now. She is requesting water po. She passed a small solid brown BM yesterday.     Objective:  Vital signs in last 24 hours: Temp:  [98.4 F (36.9 C)-98.8 F (37.1 C)] 98.4 F (36.9 C) (01/27 0535) Pulse Rate:  [83-101] 101 (01/27 1107) Resp:  [16-20] 20 (01/27 0535) BP: (119-140)/(81-95) 138/81 (01/27 1109) SpO2:  [94 %-95 %] 94 % (01/27 0535) Last BM Date: 05/10/18 General:   Alert,  Well-developed,    in NAD Heart:  Regular rate and rhythm; no murmurs Pulm; Abdomen:  Soft, nontender and nondistended. Normal bowel sounds, without guarding, and without rebound.   Extremities:  Without edema. Neurologic:  Alert and  oriented x4;  grossly normal neurologically. Psych:  Alert and cooperative. Normal mood and affect.  Intake/Output from previous day: 01/26 0701 - 01/27 0700 In: 2110.9 [I.V.:915.5; NG/GT:1195.3] Out: -  Intake/Output this shift: No intake/output data recorded.  Lab Results: Recent Labs    05/09/18 0531 05/10/18 0505 05/11/18 0458  WBC 11.3* 10.8* 11.5*  HGB 9.4* 9.6* 10.0*  HCT 29.1* 30.1* 31.4*  PLT 526* 614* 655*   BMET Recent Labs    05/09/18 0531 05/10/18 0505 05/11/18 0458  NA 138 137 136  K 3.5 3.8 4.0  CL 103 102 101  CO2 26 26 27   GLUCOSE 143* 214* 163*  BUN 5* 6 9  CREATININE 0.55 0.66 0.62  CALCIUM 8.6* 8.5* 8.9    Assessment / Plan:  58 yo female with acute on chronic pancreatitis, large pancreatic pseudocyst 20.2cm x 10cm resulting in gastric outlet obstruction which required a Dobhoff for enteral nutrition. She is sitting at the bedside without distress. No vomiting. Glucose level elevated 214-163. -RD  consult to restart Osmolite Tube feedings -Monitor Glucose levels -Dr. Hilarie Fredrickson to verify if patient ok to have clear liquids.  -Continue to monitor pseudocyst, monitor for other sequelae ie: pancreatic necrosis.  -Will continue to follow    LOS: 9 days   Noralyn Pick  05/11/2018, 11:35 AM

## 2018-05-12 LAB — BASIC METABOLIC PANEL
Anion gap: 10 (ref 5–15)
BUN: 8 mg/dL (ref 6–20)
CO2: 27 mmol/L (ref 22–32)
Calcium: 9 mg/dL (ref 8.9–10.3)
Chloride: 98 mmol/L (ref 98–111)
Creatinine, Ser: 0.54 mg/dL (ref 0.44–1.00)
GFR calc Af Amer: >60 mL/min (ref >60)
GFR calc non Af Amer: >60 mL/min (ref >60)
GLUCOSE: 126 mg/dL — AB (ref 70–99)
Potassium: 4 mmol/L (ref 3.5–5.1)
Sodium: 135 mmol/L (ref 135–145)

## 2018-05-12 LAB — GLUCOSE, CAPILLARY
GLUCOSE-CAPILLARY: 129 mg/dL — AB (ref 70–99)
Glucose-Capillary: 111 mg/dL — ABNORMAL HIGH (ref 70–99)
Glucose-Capillary: 176 mg/dL — ABNORMAL HIGH (ref 70–99)
Glucose-Capillary: 165 mg/dL — ABNORMAL HIGH (ref 70–99)

## 2018-05-12 LAB — CBC
HEMATOCRIT: 30 % — AB (ref 36.0–46.0)
Hemoglobin: 9.6 g/dL — ABNORMAL LOW (ref 12.0–15.0)
MCH: 27.2 pg (ref 26.0–34.0)
MCHC: 32 g/dL (ref 30.0–36.0)
MCV: 85 fL (ref 80.0–100.0)
NRBC: 0 % (ref 0.0–0.2)
Platelets: 637 10*3/uL — ABNORMAL HIGH (ref 150–400)
RBC: 3.53 MIL/uL — ABNORMAL LOW (ref 3.87–5.11)
RDW: 14.8 % (ref 11.5–15.5)
WBC: 11.9 10*3/uL — ABNORMAL HIGH (ref 4.0–10.5)

## 2018-05-12 LAB — LIPASE, BLOOD: Lipase: 352 U/L — ABNORMAL HIGH (ref 11–51)

## 2018-05-12 NOTE — Progress Notes (Addendum)
     [facility] Progress Note  CC:  Pancreatitis, large pancreatic pseudocyst with gastric outlet obstruction  Subjective: No nausea. Osmolite enteral tube feedings were restarted yesterday with goal rate 50 cc/hr. She was allowed small amounts of clear liquids if tolerated in addition to parental tube feeding, to stop PO fluids if any nausea/vomiting or worsening epigastric pain occurs. She complains of  Epigastric pain, receiving Dilaudid  3 mg IV Q 3 hrs PRN. She complains of having RLQ discomfort which comes and goes. No BM today. She is passing gas per the rectum.  Objective:  Vital signs in last 24 hours: Temp:  [98.6 F (37 C)-100 F (37.8 C)] 98.9 F (37.2 C) (01/28 0549) Pulse Rate:  [87-99] 87 (01/28 0549) Resp:  [16-20] 17 (01/28 0549) BP: (111-135)/(64-90) 124/80 (01/28 0549) SpO2:  [92 %-96 %] 92 % (01/28 0549) Weight:  [80 kg] 80 kg (01/28 0500) Last BM Date: 05/10/18 General:   Alert,  Well-developed,    in NAD Heart:  Regular rate and rhythm; no murmurs Pulm: fine crackles  RLL otherwise clear. Abdomen: soft, mildly distended, epigastric tenderness, RLQ tenderness with a 4 cm mobile fatty mass, midline lower abdomen scar intact.Dobhoff intact. Extremities:  Without edema. Neurologic:  Alert and  oriented x4;  grossly normal neurologically. Psych:  Alert and cooperative. Normal mood and affect.  Intake/Output from previous day: 01/27 0701 - 01/28 0700 In: 2248.7 [P.O.:200; I.V.:1711.7; NG/GT:337] Out: 302 [Urine:302] Intake/Output this shift: Total I/O In: 360 [P.O.:360] Out: -   Lab Results: Recent Labs    05/10/18 0505 05/11/18 0458 05/12/18 0518  WBC 10.8* 11.5* 11.9*  HGB 9.6* 10.0* 9.6*  HCT 30.1* 31.4* 30.0*  PLT 614* 655* 637*   BMET Recent Labs    05/10/18 0505 05/11/18 0458 05/12/18 0518  NA 137 136 135  K 3.8 4.0 4.0  CL 102 101 98  CO2 26 27 27   GLUCOSE 214* 163* 126*  BUN 6 9 8   CREATININE 0.66 0.62 0.54  CALCIUM  8.5* 8.9 9.0    Assessment / Plan:  1) 58 yo female with acute on chronic pancreatitis, large pancreatic pseudocyst 20.2cm x 10cm resulting in gastric outlet obstruction which required a Dobhoff for enteral nutrition. Lipase today 352. Slight increase in WBC 11.9. Decrease Hg 9.6. HCT 30. -Restart Osmolite feedings per RD consult, goal 50cc/hr. -Patient may have small amounts of clear liquids in addition to Osmolite enteral feedings, stop po liquids of patient develops nausea, vomiting or epigastric pain. -Repeat abd/pelvic CT near future. -repeat CBC and CMP in am  2) Epigastric pain secondary to # 1 -Dilaudid Q 3 hours PRN.  3) RLQ pain with 3 to 4 cm mobile mass on exam.  Numerous abd/pelvic CTs since her admission do not show RLQ mass, possible fatty tumor.  -Will request radiologist to review prior abd/pelvic CT -Further recommendations per Dr. Hilarie Fredrickson.  Addendum: I spoke to radiologist, Dr. Candise Che, he reviewed her 05/02/2018 abd/pelvic CT, he verified the above lower abdominal mass is consistent with a 4cm abdominal/pelvic wall hernia containing a moderate amount of fat and a small amount of fluid.      LOS: 10 days   Noralyn Pick  05/12/2018, 11:11 AM

## 2018-05-12 NOTE — Progress Notes (Signed)
PROGRESS NOTE  Amanda Hahn XAJ:[id number] DOB: 12/23/1960 DOA: 05/02/2018 PCP: Patient, No Pcp Per  HPI/Recap of past 24 hours: 58 y.o.femalewith pancreatitis,diabetes mellitus type 2,hypertensionpresented to hospital with complaints of nausea vomiting abdominal pain. Of note patient was recently discharged from [facility] after treatment of pancreatitis. She was in the hospital for at least 3 days and wasdischarged home almost 3 days back. Post discharge,she continued to have nausea,vomiting and severe abdominal pain. Due to persistent symptoms, patient [facility]. In the ED, tenderness over the epigastric region with some guarding.Lipase was elevated and CT scan of the abdomen showed large pseudocyst measuring around 20 x 10 cm which was completely new compared to a CT scan done 1 week back when she was admitted at [facility]. This case was discussed with Dr. Ardis Hughs of our GI as well as Dr. Tera Helper of general surgery. No acute intervention were advised and patient was started on conservative treatment. Patient was then considered for admission to the hospital for IV fluids pain control and nutrition.  Hospital course complicated by worsening symptomatology and elevated lipase level initially then started to trend down on 05/10/2018.  NG tube placed on 05/06/2018 due to slow improvement and worsening leukocytosis which improved shortly after placement.  Then patient had a core track placed in on 05/08/2018 with feeding tube started.  Tube feeding has been well-tolerated.  05/12/2018: Patient seen and examined at bedside.  No acute events overnight.  She is tolerating a clear liquid diet.  GI advised to continue tube feeding along with clear liquid diet to maintain her nutritional requirements.  This was communicated to the patient's RN who will pass it along to the incoming RN.      Assessment/Plan: Principal Problem:  Peripancreatic fluid collection Active Problems:   Abdominal distension (gaseous)   Vomiting   Gastric outlet obstruction   Acute pancreatitis without infection or necrosis  Acute on chronic pancreatitis with large pseudocyst and concern for gastric outlet obstruction Independently reviewed CT abdomen and pelvis with contrast done on 05/02/2018 which revealed large pseudocyst at the esophageal hiatus compared with CT abdomen pelvis with contrast done on 04/23/2018. Independently reviewed abdomen 2 view x-ray done on 05/06/2018 which revealed evidence of gastric outlet obstruction and noted calcification of the pancrea GI and general surgery following Recommendation for reimaging several weeks later to decide if drain warranted NG tube removed on 05/08/2018 Core track placed in by interventional radiology.  Started tube feeding on 05/08/2018 and tolerating well Continue tube feeding along with clear liquid diet as recommended by GI Lipase continues to trend down 352 from 844 from 1043 Repeat lipase level in the morning  Hypokalemia, resolving post repletion Dependently reviewed labs which are stable.  Elevated alkaline phosphatase Obtain CMP tomorrow  Resolved leukocytosis SuspectED reactive from pancreatitis  Normocytic anemia Appears to be dilutional from IV fluid hydration No sign of overt bleeding  Type 2 diabetes complicated by hypoglycemia No recurrence Started tube feeding on 05/08/2018  Hypertension Blood pressure is stable Continue amlodipine 10 mg daily, Lopressor 25 mg twice daily     DVT prophylaxis: Lovenox subQ Code Status: Full Family Communication:  None at bedside Disposition Plan:  Discharge to home once general surgery and GI sign off.  Consultants:   GI  General surgery  Procedures:   NG to suction placement on 05/06/2018 and removed on 05/08/18  Cortrack tube feeding placed on 05/08/18 by IR   Objective: Vitals:   05/11/18 2108  05/12/18 0500  05/12/18 0549 05/12/18 1239  BP: 111/73  124/80 110/82  Pulse: 98  87 84  Resp: 20  17 16   Temp: 98.6 F (37 C)  98.9 F (37.2 C) 98 F (36.7 C)  TempSrc: Oral  Oral Oral  SpO2: 95%  92% 93%  Weight:  80 kg    Height:        Intake/Output Summary (Last 24 hours) at 05/12/2018 2039 Last data filed at 05/12/2018 1238 Gross per 24 hour  Intake 1432.03 ml  Output -  Net 1432.03 ml   Filed Weights   05/02/18 1138 05/12/18 0500  Weight: 78 kg 80 kg    Exam:  . General: 58 y.o. year-old female well-developed well-nourished in no acute distress.  Alert and oriented x3.  Core track in place. . Cardiovascular: Regular rate and rhythm with no rubs or gallops.  No JVD or thyromegaly noted. Marland Kitchen Respiratory: Clear to Auscultation with No Wheezes or Rales.  Poor Inspiratory Effort. . Abdomen: Soft normal bowel sounds x4 quadrants.  Nontender. . Musculoskeletal: No lower extremity edema. 2/4 pulses in all 4 extremities. Marland Kitchen Psychiatry: Mood is appropriate for condition and setting   Data Reviewed: CBC: Recent Labs  Lab 05/08/18 0330 05/09/18 0531 05/10/18 0505 05/11/18 0458 05/12/18 0518  WBC 11.1* 11.3* 10.8* 11.5* 11.9*  HGB 9.7* 9.4* 9.6* 10.0* 9.6*  HCT 29.8* 29.1* 30.1* 31.4* 30.0*  MCV 83.5 83.4 84.6 84.6 85.0  PLT 486* 526* 614* 655* 937*   Basic Metabolic Panel: Recent Labs  Lab 05/08/18 0330 05/09/18 0531 05/10/18 0505 05/11/18 0458 05/12/18 0518  NA 139 138 137 136 135  K 3.4* 3.5 3.8 4.0 4.0  CL 103 103 102 101 98  CO2 28 26 26 27 27   GLUCOSE 125* 143* 214* 163* 126*  BUN <5* 5* 6 9 8   CREATININE 0.53 0.55 0.66 0.62 0.54  CALCIUM 8.5* 8.6* 8.5* 8.9 9.0  MG  --  1.8 1.9 2.0  --   PHOS  --  4.4 3.8 3.8  --    GFR: Estimated Creatinine Clearance: 74.2 mL/min (by C-G formula based on SCr of 0.54 mg/dL). Liver Function Tests: Recent Labs  Lab 05/06/18 0312  AST 25  ALT 10  ALKPHOS 142*  BILITOT 0.5  PROT 7.8  ALBUMIN 2.7*   Recent Labs  Lab  05/07/18 0431 05/09/18 0531 05/10/18 0505 05/12/18 0518  LIPASE 970* 1,043* 844* 352*   No results for input(s): AMMONIA in the last 168 hours. Coagulation Profile: No results for input(s): INR, PROTIME in the last 168 hours. Cardiac Enzymes: No results for input(s): CKTOTAL, CKMB, CKMBINDEX, TROPONINI in the last 168 hours. BNP (last 3 results) No results for input(s): PROBNP in the last 8760 hours. HbA1C: No results for input(s): HGBA1C in the last 72 hours. CBG: Recent Labs  Lab 05/11/18 1843 05/11/18 2351 05/12/18 0547 05/12/18 1140 05/12/18 1752  GLUCAP 148* 166* 129* 111* 176*   Lipid Profile: No results for input(s): CHOL, HDL, LDLCALC, TRIG, CHOLHDL, LDLDIRECT in the last 72 hours. Thyroid Function Tests: No results for input(s): TSH, T4TOTAL, FREET4, T3FREE, THYROIDAB in the last 72 hours. Anemia Panel: No results for input(s): VITAMINB12, FOLATE, FERRITIN, TIBC, IRON, RETICCTPCT in the last 72 hours. Urine analysis:    Component Value Date/Time   COLORURINE YELLOW 05/02/2018 1121   APPEARANCEUR HAZY (A) 05/02/2018 1121   LABSPEC 1.017 05/02/2018 1121   PHURINE 6.0 05/02/2018 1121   GLUCOSEU NEGATIVE 05/02/2018 1121   HGBUR  SMALL (A) 05/02/2018 1121   BILIRUBINUR NEGATIVE 05/02/2018 1121   KETONESUR 20 (A) 05/02/2018 1121   PROTEINUR 30 (A) 05/02/2018 1121   UROBILINOGEN 0.2 09/03/2010 1342   NITRITE NEGATIVE 05/02/2018 1121   LEUKOCYTESUR NEGATIVE 05/02/2018 1121   Sepsis Labs: @LABRCNTIP (procalcitonin:4,lacticidven:4)  )No results found for this or any previous visit (from the past 240 hour(s)).    Studies: No results found.  Scheduled Meds: . amLODipine  10 mg Oral Daily  . chlorhexidine  15 mL Mouth Rinse BID  . enoxaparin (LOVENOX) injection  40 mg Subcutaneous Q24H  . feeding supplement (OSMOLITE 1.5 CAL)  1,000 mL Per Tube Q24H  . insulin aspart  0-9 Units Subcutaneous Q6H  . mouth rinse  15 mL Mouth Rinse q12n4p  . metoprolol tartrate   25 mg Oral BID  . multivitamin with minerals  1 tablet Oral Daily  . oxyCODONE  5 mg Per Tube Q6H  . polyethylene glycol  17 g Per Tube Daily  . sodium chloride flush  3 mL Intravenous Once    Continuous Infusions: . lactated ringers 75 mL/hr at 05/12/18 0537     LOS: 10 days     Kayleen Memos, MD [facility] Pager [phone]  If 7PM-7AM, please contact night-coverage [email or web address] Password [facility] 05/12/2018, 8:39 PM

## 2018-05-13 LAB — CBC
HCT: 28.1 % — ABNORMAL LOW (ref 36.0–46.0)
Hemoglobin: 9 g/dL — ABNORMAL LOW (ref 12.0–15.0)
MCH: 26.9 pg (ref 26.0–34.0)
MCHC: 32 g/dL (ref 30.0–36.0)
MCV: 83.9 fL (ref 80.0–100.0)
Platelets: 737 10*3/uL — ABNORMAL HIGH (ref 150–400)
RBC: 3.35 MIL/uL — ABNORMAL LOW (ref 3.87–5.11)
RDW: 14.6 % (ref 11.5–15.5)
WBC: 11 10*3/uL — ABNORMAL HIGH (ref 4.0–10.5)
nRBC: 0 % (ref 0.0–0.2)

## 2018-05-13 LAB — GLUCOSE, CAPILLARY
GLUCOSE-CAPILLARY: 187 mg/dL — AB (ref 70–99)
GLUCOSE-CAPILLARY: 165 mg/dL — AB (ref 70–99)
Glucose-Capillary: 178 mg/dL — ABNORMAL HIGH (ref 70–99)
Glucose-Capillary: 194 mg/dL — ABNORMAL HIGH (ref 70–99)

## 2018-05-13 LAB — COMPREHENSIVE METABOLIC PANEL
ALT: 11 U/L (ref 0–44)
AST: 25 U/L (ref 15–41)
Albumin: 2.7 g/dL — ABNORMAL LOW (ref 3.5–5.0)
Alkaline Phosphatase: 115 U/L (ref 38–126)
Anion gap: 11 (ref 5–15)
BUN: 8 mg/dL (ref 6–20)
CO2: 26 mmol/L (ref 22–32)
Calcium: 8.9 mg/dL (ref 8.9–10.3)
Chloride: 98 mmol/L (ref 98–111)
Creatinine, Ser: 0.69 mg/dL (ref 0.44–1.00)
GFR calc Af Amer: >60 mL/min (ref >60)
GFR calc non Af Amer: >60 mL/min (ref >60)
Glucose, Bld: 159 mg/dL — ABNORMAL HIGH (ref 70–99)
Potassium: 3.8 mmol/L (ref 3.5–5.1)
Sodium: 135 mmol/L (ref 135–145)
Total Bilirubin: 0.6 mg/dL (ref 0.3–1.2)
Total Protein: 7.3 g/dL (ref 6.5–8.1)

## 2018-05-13 LAB — LIPASE, BLOOD: Lipase: 298 U/L — ABNORMAL HIGH (ref 11–51)

## 2018-05-13 LAB — MAGNESIUM: Magnesium: 2.1 mg/dL (ref 1.7–2.4)

## 2018-05-13 LAB — PHOSPHORUS: Phosphorus: 4 mg/dL (ref 2.5–4.6)

## 2018-05-13 NOTE — Progress Notes (Signed)
      Gastroenterology Progress Note  CC:  ETOH pancreatitis with pseudocyst, gastric outlet obstruction   Subjective: She is sitting up in the chair. No significant abdominal pain, a few twinges of pain to the right flank area. She passed a small solid stool this am. No rectal bleeding or melena. Osmolite tube feedings well tolerated. Family at bedside.  Objective:  Vital signs in last 24 hours: Temp:  [98 F (36.7 C)-98.8 F (37.1 C)] 98 F (36.7 C) (01/29 0600) Pulse Rate:  [84-94] 92 (01/29 0600) Resp:  [16] 16 (01/29 0600) BP: (110-134)/(69-82) 134/70 (01/29 0600) SpO2:  [90 %-93 %] 92 % (01/29 0600) Last BM Date: 05/10/18 General:   Alert,  Well-developed,    in NAD Heart:  Regular rate and rhythm; no murmurs Pulm: fine crackles RLL otherwise clear throughout.  Abdomen:  Soft, mild epigastric tenderness, RLQ to central lower abdomen with 4cm palpable hernia confirmed by prior CT, lower abdominal midline scar intact, + BS x 4 quads.  Extremities:  Without edema. Neurologic:  Alert and  oriented x4;  grossly normal neurologically. Psych:  Alert and cooperative. Normal mood and affect.  Intake/Output from previous day: 01/28 0701 - 01/29 0700 In: 3476.5 [P.O.:480; I.V.:2025; NG/GT:971.5] Out: -  Intake/Output this shift: Total I/O In: 759.7 [P.O.:240; I.V.:289.7; NG/GT:230] Out: -   Lab Results: Recent Labs    05/11/18 0458 05/12/18 0518 05/13/18 0341  WBC 11.5* 11.9* 11.0*  HGB 10.0* 9.6* 9.0*  HCT 31.4* 30.0* 28.1*  PLT 655* 637* 737*   BMET Recent Labs    05/11/18 0458 05/12/18 0518 05/13/18 0341  NA 136 135 135  K 4.0 4.0 3.8  CL 101 98 98  CO2 27 27 26   GLUCOSE 163* 126* 159*  BUN 9 8 8   CREATININE 0.62 0.54 0.69  CALCIUM 8.9 9.0 8.9   LFT Recent Labs    05/13/18 0341  PROT 7.3  ALBUMIN 2.7*  AST 25  ALT 11  ALKPHOS 115  BILITOT 0.6    Assessment / Plan: 1) 58 yo female with acute on chronic pancreatitis, 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 The pseudocyst is immature, not yet walled off. Possible cystgastrostomy in 4 weeks. Lipase levels continue to drift down.  Normal LFTs. -continue Osmolite tube feedings at 50cc/hr -continue clear liquids as tolerated, stop po liquids if nausea/vomiting or worsening abdominal pain occurs.  -Dr. Rush Landmark reviewed patient's records, to repeat an abd/pelvic CT with IV and PO contrast second week of Feb. 2020, consider cystgastrostomy in 4 weeks. - No signs of infection at this time (WBC 11.0) , If she develops any sign of infection then IR to place a drain. -Most likely will require discharge to skilled rehab facility, further discharge planning to be determined.  2) Anemia: Hg 9.0 (9.6). Hct 28.2. (30.0). MCV 83.0. Anemia ? Dilutional, no evidence of overt bleeding, Consider checking stool for occult blood.  -repeat CBC in am  3) Abdominal pain: well controlled at this time -continue Dilaudid PRN -encouraged to sit up in chair          LOS: 11 days   Noralyn Pick  05/13/2018, 11:44 AM

## 2018-05-13 NOTE — Progress Notes (Signed)
Report handed off  to [facility] who will resume care.

## 2018-05-13 NOTE — Progress Notes (Signed)
Nutrition Follow-up  INTERVENTION:   -Continue Osmolite 1.5 @ 50 ml/hr via Cortrak. -Encourage PO intake as tolerated  NUTRITION DIAGNOSIS:   Increased nutrient needs related to (acute on chronic ETOH pancreatitis) as evidenced by estimated needs.  Ongoing.  GOAL:   Patient will meet greater than or equal to 90% of their needs  Meeting with TF.  MONITOR:   Labs, Weight trends, TF tolerance, I & O's  ASSESSMENT:   58 y.o. female with pancreatitis, diabetes mellitus type 2, hypertension presented to hospital with complaints of nausea vomiting abdominal pain. 1/24: Cortrak placement -post-pyloric via IR  Patient is now receiving Osmolite 1.5 @ goal rate of 50 ml/hr. Pt seems in better spirits today. States she feels hungry and feels like she could eat a full meal. Per GI, pt to remain on clear liquids. Pt will need cystgastrostomy tentatively in 4 weeks.   Medications: Multivitamin with minerals daily, Lactated Ringers infusion at 75 ml/hr Labs reviewed: CBGs: 178-187  Diet Order:   Diet Order            Diet clear liquid Room service appropriate? Yes; Fluid consistency: Thin  Diet effective now              EDUCATION NEEDS:   Education needs have been addressed  Skin:  Skin Assessment: Reviewed RN Assessment  Last BM:  1/26  Height:   Ht Readings from Last 1 Encounters:  05/02/18 5\' 2"  (1.575 m)    Weight:   Wt Readings from Last 1 Encounters:  05/12/18 80 kg    Ideal Body Weight:  50 kg  BMI:  Body mass index is 32.26 kg/m.  Estimated Nutritional Needs:   Kcal:  1500-1700  Protein:  70-80g  Fluid:  1.7L/day  Clayton Bibles, MS, RD, LDN [place] [facility] Pager: [phone] After Hours Pager: [phone]

## 2018-05-13 NOTE — Progress Notes (Signed)
Case discussed with Dr. Rush Landmark. Patient not yet seen today.  We will round later this am or pm. Below are Dr. Donneta Romberg thoughts/recommendations regarding pancreatic fluid collection/pseudocyst:  "This patient's initial presentation seems to be around 1/9.  When she represented the next week then we saw the new cyst.  This is very immature currently.  We need at least 4-weeks, ideally before we start going after a cystgastrostomy unless you think she is infected and then we can have an IR placed drain.  I would time out a repeat CT-AP with IV and PO contrast around the second week of February, if she can tolerate.  Do you think she will be able to get to rehab with the Dobhoff tube and then get outpatient imaging or is she someone that will likely remain an inpatient?"   I appreciate his recommendations.  No current evidence of infection or the need to drain the collection now. Disposition pending, but ideally she would leave the acute inpatient setting for either rehab or care at home and return for outpatient imaging and management of pseudocyst.  This is to be determined.

## 2018-05-13 NOTE — Progress Notes (Signed)
PROGRESS NOTE  Amanda Hahn JWJ:[id number] DOB: 1960-08-16 DOA: 05/02/2018 PCP: Patient, No Pcp Per   LOS: 11 days   Brief narrative: Patient is a 58 y.o.femalewith pancreatitis,diabetes mellitus type 2,hypertensionpresented to hospital with complaints of nausea vomiting abdominal pain. Of note patient was recently discharged from [facility] after treatment of pancreatitis. She was in the hospital for at least 3 days and wasdischarged home almost 3 days back. Post discharge,she continued to have nausea,vomiting and severe abdominal pain. Due to persistent symptoms, patient decided to cometo ourhospital. In the ED, she was noted to have tenderness over the epigastric region with some guarding.Lipase was elevated and CT scan of the abdomen showed large pseudocyst measuring around 20 x 10 cm which was completely new compared to a CT scan done 1 week back when she was admitted at [facility]. This case was discussed with Dr. Ardis Hughs of our GI as well as Dr. Tera Helper of general surgery. No acute intervention were advised and patient was started on conservative treatment. Patient was then considered for admission to the hospital for IV fluids pain control and nutrition.  Hospital course complicated by worsening symptoms and elevated lipase level initially then started to trend down on 05/10/2018.  NG tube placed on 05/06/2018 due to slow improvement and worsening leukocytosis which improved shortly after placement.  Then patient had a NG tube placed on 05/08/2018 with feeding tube started.  Tube feeding has been well-tolerated.  Assessment/Plan:  Principal Problem:   Peripancreatic fluid collection Active Problems:   Abdominal distension (gaseous)   Vomiting   Gastric outlet obstruction   Acute pancreatitis without infection or necrosis  Acute on chronic pancreatitis with large pseudocyst and concern for gastric outlet obstruction CT abdomen and pelvis  with contrast done on 05/02/2018 revealed large pseudocyst at the esophageal hiatus compared with CT abdomen pelvis with contrast done on 04/23/2018. Abdomen 2 view x-ray done on 05/06/2018 revealed evidence of gastric outlet obstruction and noted calcification of the pancreas. GI and general surgery following Recommendation for reimaging several weeks later to decide if drain is warranted Started NG tube feeding on 05/08/2018 and tolerating well Continue tube feeding along with clear liquid diet as recommended by GI Lipase continues to trend down, down to 298 today Repeat lipase level in the morning.  Hypokalemia, resolving post repletion  Normocytic anemia -chronic. Appears to be dilutional from IV fluid hydration. No sign of overt bleeding  Type 2 diabetes complicated by hypoglycemia. No recurrence. Started tube feeding on 05/08/2018  Hypertension - Blood pressure is stable. Continue amlodipine 10 mg daily, Lopressor 25 mg twice daily  VTE Prophylaxis: Lovenox subcu    Code Status: Full Code   Disposition Plan: Pending GI clearance.  Antibiotics: Antibiotics Given (last 72 hours)    None     Continuous Infusions: . lactated ringers 75 mL/hr at 05/13/18 1301    Scheduled Meds: . amLODipine  10 mg Oral Daily  . chlorhexidine  15 mL Mouth Rinse BID  . enoxaparin (LOVENOX) injection  40 mg Subcutaneous Q24H  . feeding supplement (OSMOLITE 1.5 CAL)  1,000 mL Per Tube Q24H  . insulin aspart  0-9 Units Subcutaneous Q6H  . mouth rinse  15 mL Mouth Rinse q12n4p  . metoprolol tartrate  25 mg Oral BID  . multivitamin with minerals  1 tablet Oral Daily  . oxyCODONE  5 mg Per Tube Q6H  . polyethylene glycol  17 g Per Tube Daily  . sodium chloride flush  3  mL Intravenous Once    PRN meds: acetaminophen **OR** acetaminophen, bisacodyl, HYDROmorphone (DILAUDID) injection, metoprolol tartrate, ondansetron (ZOFRAN) IV, phenol   Subjective: Patient was seen and examined this morning.   Patient middle-aged African-American female.  Not in distress.  Continues to have distended belly, had a bowel movement yesterday.  Getting NG tube feeding  Objective: Vitals:   05/13/18 0600 05/13/18 1311  BP: 134/70 122/86  Pulse: 92 84  Resp: 16 18  Temp: 98 F (36.7 C) 99.8 F (37.7 C)  SpO2: 92% 97%    Intake/Output Summary (Last 24 hours) at 05/13/2018 1437 Last data filed at 05/13/2018 1100 Gross per 24 hour  Intake 3756.15 ml  Output -  Net 3756.15 ml   Filed Weights   05/02/18 1138 05/12/18 0500  Weight: 78 kg 80 kg   Body mass index is 32.26 kg/m.   Physical Exam: GENERAL: Pleasant, middle-aged African-American female.  Not in distress HENT: No scleral pallor or icterus. Pupils equally reactive to light. Oral mucosa is moist NECK: is supple, no palpable thyroid enlargement. CHEST: Clear to auscultation. No crackles or wheezes. Non tender on palpation. Diminished breath sounds bilaterally. CVS: S1 and S2 heard, no murmur. Regular rate and rhythm. No pericardial rub. ABDOMEN: Soft, mild diffuse tenderness present, bowel sounds are present. No palpable hepato-splenomegaly. EXTREMITIES: No edema. CNS: Alert, alert and oriented x3 SKIN: warm and dry without rashes.  Data Review: I have personally reviewed the laboratory data and studies available.  Terrilee Croak, MD  [facility] 05/13/2018

## 2018-05-14 LAB — GLUCOSE, CAPILLARY
GLUCOSE-CAPILLARY: 218 mg/dL — AB (ref 70–99)
Glucose-Capillary: 186 mg/dL — ABNORMAL HIGH (ref 70–99)
Glucose-Capillary: 165 mg/dL — ABNORMAL HIGH (ref 70–99)
Glucose-Capillary: 173 mg/dL — ABNORMAL HIGH (ref 70–99)

## 2018-05-14 LAB — CBC WITH DIFFERENTIAL/PLATELET
Abs Immature Granulocytes: 0.05 10*3/uL (ref 0.00–0.07)
Basophils Absolute: 0.1 10*3/uL (ref 0.0–0.1)
Basophils Relative: 1 %
Eosinophils Absolute: 0.5 10*3/uL (ref 0.0–0.5)
Eosinophils Relative: 4 %
HCT: 28.9 % — ABNORMAL LOW (ref 36.0–46.0)
Hemoglobin: 9.2 g/dL — ABNORMAL LOW (ref 12.0–15.0)
Immature Granulocytes: 1 %
Lymphocytes Relative: 24 %
Lymphs Abs: 2.5 10*3/uL (ref 0.7–4.0)
MCH: 26.8 pg (ref 26.0–34.0)
MCHC: 31.8 g/dL (ref 30.0–36.0)
MCV: 84.3 fL (ref 80.0–100.0)
Monocytes Absolute: 0.7 10*3/uL (ref 0.1–1.0)
Monocytes Relative: 7 %
Neutro Abs: 6.7 10*3/uL (ref 1.7–7.7)
Neutrophils Relative %: 63 %
Platelets: 724 10*3/uL — ABNORMAL HIGH (ref 150–400)
RBC: 3.43 MIL/uL — ABNORMAL LOW (ref 3.87–5.11)
RDW: 14.6 % (ref 11.5–15.5)
WBC: 10.6 10*3/uL — AB (ref 4.0–10.5)
nRBC: 0 % (ref 0.0–0.2)

## 2018-05-14 LAB — BASIC METABOLIC PANEL
Anion gap: 10 (ref 5–15)
BUN: 8 mg/dL (ref 6–20)
CHLORIDE: 97 mmol/L — AB (ref 98–111)
CO2: 26 mmol/L (ref 22–32)
Calcium: 8.9 mg/dL (ref 8.9–10.3)
Creatinine, Ser: 0.55 mg/dL (ref 0.44–1.00)
GFR calc Af Amer: >60 mL/min (ref >60)
GFR calc non Af Amer: >60 mL/min (ref >60)
Glucose, Bld: 129 mg/dL — ABNORMAL HIGH (ref 70–99)
Potassium: 3.9 mmol/L (ref 3.5–5.1)
SODIUM: 133 mmol/L — AB (ref 135–145)

## 2018-05-14 LAB — LIPASE, BLOOD: Lipase: 362 U/L — ABNORMAL HIGH (ref 11–51)

## 2018-05-14 NOTE — Progress Notes (Signed)
     [facility] Progress Note  CC:  ETOH pancreatitis with pseudocyst, gastric outlet obstruction    Subjective: She is having some LLQ abdominal discomfort. No abdominal pain at this time. Pain is relieved by Dilaudid IV every 3 hours. She passed 2 soft BMs yesterday. She is tolerating Osmolite tube feedings.    Objective:  Vital signs in last 24 hours: Temp:  [98.1 F (36.7 C)-99.8 F (37.7 C)] 98.1 F (36.7 C) (01/30 0536) Pulse Rate:  [83-93] 83 (01/30 0536) Resp:  [16-18] 16 (01/30 0536) BP: (119-131)/(79-86) 131/79 (01/30 0536) SpO2:  [94 %-100 %] 100 % (01/30 0536) Weight:  [74.4 kg] 74.4 kg (01/30 0646) Last BM Date: 05/13/18 General:   Alert,  Well-developed,    in NAD Heart:  Regular rate and rhythm; no murmurs Pulm: clear, slightly diminished to the bases.  Abdomen:  Soft, mild epigastric and LLQ tenderness, lower abdominal hernia less palpable today, + BS x 4 quads. Extremities:  Without edema. Neurologic:  Alert and  oriented x4;  grossly normal neurologically. Psych:  Alert and cooperative. Normal mood and affect.  Intake/Output from previous day: 01/29 0701 - 01/30 0700 In: 3869.2 [P.O.:960; I.V.:1708.4; NG/GT:1200.8] Out: -  Intake/Output this shift: Total I/O In: 60 [P.O.:60] Out: -   Lab Results: Recent Labs    05/12/18 0518 05/13/18 0341 05/14/18 0349  WBC 11.9* 11.0* 10.6*  HGB 9.6* 9.0* 9.2*  HCT 30.0* 28.1* 28.9*  PLT 637* 737* 724*   BMET Recent Labs    05/12/18 0518 05/13/18 0341 05/14/18 0349  NA 135 135 133*  K 4.0 3.8 3.9  CL 98 98 97*  CO2 27 26 26   GLUCOSE 126* 159* 129*  BUN 8 8 8   CREATININE 0.54 0.69 0.55  CALCIUM 9.0 8.9 8.9   LFT Recent Labs    05/13/18 0341  PROT 7.3  ALBUMIN 2.7*  AST 25  ALT 11  ALKPHOS 115  BILITOT 0.6    Assessment/Plan  1) 58 yo female with acute on chronic pancreatitis, large pancreatic pseudocyst 20.2cm x 10cm resulting in gastric outlet obstruction which required a  nasoenteric feeding tube for nutrition. The pseudocyst is immature, not yet walled off. Possible cystgastrostomy in 4 weeks. Lipase levels continue to drift down.  Normal LFTs. -continue Osmolite tube feedings at 50cc/hr -continue clear liquids as tolerated, stop po liquids if nausea/vomiting or worsening abdominal pain occurs.  -Dr. Rush Landmark reviewed patient's records, to repeat an abd/pelvic CT with IV and PO contrast second week of Feb. 2020, consider cystgastrostomy in 4 weeks. - No signs of infection at this time (WBC 11.0) , If she develops any sign of infection then IR to place a drain. -Most likely will require discharge to skilled rehab facility, further discharge planning to be determined  2) Anemia: Hg and Hct stable -CBC in am  3) Mild hyponatremia -repeat CMP in am - LR IV infusing @ 75 cc/hr, hospitalist to follow    LOS: 12 days   Noralyn Pick  05/14/2018, 12:37 PM

## 2018-05-14 NOTE — Progress Notes (Signed)
PROGRESS NOTE  Amanda Hahn JQB:[id number] DOB: Aug 14, 1960 DOA: 05/02/2018 PCP: Amanda Hahn, No Pcp Per   LOS: 12 days   Brief narrative: Amanda Hahn is a 58 y.o.femalewith pancreatitis,diabetes mellitus type 2,hypertensionpresented to hospital with complaints of nausea, vomiting abdominal pain. Of note Amanda Hahn was recently discharged from [facility] after treatment of acute pancreatitis. She was in the hospital for at least 3 days and wasdischarged home almost 3 days ago. After discharge, she continued to have nausea,vomiting and severe abdominal pain. Due to persistent symptoms, Amanda H[facility]. In the ED, she was noted to have tenderness over the epigastric region with some guarding.Lipase was elevated and CT scan of the abdomen showed large pseudocyst measuring around 20 x 10 cm which was completely new compared to a CT scan done 1 week back when she was admitted at [facility]. This case was discussed with Dr. Ardis Hughs of our GI as well as Dr. Tera Helper of general surgery. No acute intervention were advised and Amanda Hahn was started on conservative treatment. Amanda Hahn was then considered for admission to the hospital for IV fluids pain control and nutrition.  Hospital course complicated by worsening symptoms and elevated lipase level initially then started to trend down on 05/10/2018.  NG tube placed on on 05/08/2018 with feeding tube started.  Tube feeding has been well-tolerated.  Assessment/Plan:  Principal Problem:   Peripancreatic fluid collection Active Problems:   Abdominal distension (gaseous)   Vomiting   Gastric outlet obstruction   Acute pancreatitis without infection or necrosis  Acute on chronic pancreatitis with large pseudocyst and concern for gastric outlet obstruction CT abdomen and pelvis with contrast done on 05/02/2018 revealed large pseudocyst at the esophageal hiatus compared with CT abdomen pelvis with contrast  done on 04/23/2018. Abdomen 2 view x-ray done on 05/06/2018 revealed evidence of gastric outlet obstruction and noted calcification of the pancreas. GI and general surgery following.Recommendation for reimaging several weeks later to decide if drain is warranted Started NG tube feeding on 05/08/2018 and tolerating well Continue tube feeding along with clear liquid diet as recommended by GI Lipase continues to trend down and fluctuating. repeat lipase level in the morning.  Hypokalemia, resolving post repletion.  Normocytic anemia -chronic. Appears to be dilutional from IV fluid hydration. No sign of overt bleeding  Type 2 diabetes complicated by hypoglycemia. No recurrence. Started tube feeding on 05/08/2018.  Hypertension - Blood pressure is stable. Continue amlodipine 10 mg daily, Lopressor 25 mg twice daily  VTE Prophylaxis: Lovenox subcu    Code Status: Full Code   Disposition Plan: Pending improvement on pain level and tolerance of diet.  Antibiotics: Antibiotics Given (last 72 hours)    None     Continuous Infusions: . lactated ringers 75 mL/hr at 05/14/18 0213    Scheduled Meds: . amLODipine  10 mg Oral Daily  . chlorhexidine  15 mL Mouth Rinse BID  . enoxaparin (LOVENOX) injection  40 mg Subcutaneous Q24H  . feeding supplement (OSMOLITE 1.5 CAL)  1,000 mL Per Tube Q24H  . insulin aspart  0-9 Units Subcutaneous Q6H  . mouth rinse  15 mL Mouth Rinse q12n4p  . metoprolol tartrate  25 mg Oral BID  . multivitamin with minerals  1 tablet Oral Daily  . oxyCODONE  5 mg Per Tube Q6H  . polyethylene glycol  17 g Per Tube Daily  . sodium chloride flush  3 mL Intravenous Once    PRN meds: acetaminophen **OR** acetaminophen, bisacodyl, HYDROmorphone (DILAUDID) injection, metoprolol  tartrate, ondansetron (ZOFRAN) IV, phenol   Subjective: Amanda Hahn was seen and examined this morning.  Amanda Hahn's sister and daughter were at the bedside. She does have episodic pain and needing  Dilaudid IV.  Denies any nausea or vomiting.  Tube feedings are tolerated.  She had 2 normal bowel movement last night. Objective: Vitals:   05/14/18 0536 05/14/18 1337  BP: 131/79 126/81  Pulse: 83 80  Resp: 16 20  Temp: 98.1 F (36.7 C) 98.2 F (36.8 C)  SpO2: 100% 95%    Intake/Output Summary (Last 24 hours) at 05/14/2018 1622 Last data filed at 05/14/2018 1100 Gross per 24 hour  Intake 2929.52 ml  Output -  Net 2929.52 ml   Filed Weights   05/02/18 1138 05/12/18 0500 05/14/18 0646  Weight: 78 kg 80 kg 74.4 kg   Body mass index is 30 kg/m.   Physical Exam: GENERAL: Pleasant, middle-aged African-American female.  Not in distress HENT: No scleral pallor or icterus. Pupils equally reactive to light. Oral mucosa is moist NECK: is supple, no palpable thyroid enlargement. CHEST: Clear to auscultation. No crackles or wheezes. Non tender on palpation. Diminished breath sounds bilaterally. CVS: S1 and S2 heard, no murmur. Regular rate and rhythm. No pericardial rub. ABDOMEN: Soft, mild diffuse tenderness at the epigastrium, bowel sounds are present. No palpable hepato-splenomegaly. EXTREMITIES: No edema. CNS: Alert, alert and oriented x3 SKIN: warm and dry without rashes.  Data Review: I have personally reviewed the laboratory data and studies available.  Barb Merino, MD  [facility] 05/14/2018

## 2018-05-15 LAB — GLUCOSE, CAPILLARY
GLUCOSE-CAPILLARY: 130 mg/dL — AB (ref 70–99)
Glucose-Capillary: 127 mg/dL — ABNORMAL HIGH (ref 70–99)
Glucose-Capillary: 158 mg/dL — ABNORMAL HIGH (ref 70–99)
Glucose-Capillary: 189 mg/dL — ABNORMAL HIGH (ref 70–99)
Glucose-Capillary: 205 mg/dL — ABNORMAL HIGH (ref 70–99)

## 2018-05-15 LAB — LIPASE, BLOOD: Lipase: 277 U/L — ABNORMAL HIGH (ref 11–51)

## 2018-05-15 NOTE — Care Management (Addendum)
This CM was informed by nursing staff that pt plans to DC with nasoenteric feeding tube for nutrition for several weeks. This CM contacted both [facility] rep for charity for DME needs and [facility] rep for charity for home health service needs. Will need orders for tube feeding pump, tube feeds and HHRN. Pt declines hospital bed at this time and states she has the ability to keep herself at 45 degrees for her feeding. Marney Doctor RN,BSN [phone]

## 2018-05-15 NOTE — Progress Notes (Signed)
     [facility] Progress Note  CC:  ETOH pancreatitis with pseudocyst, gastric outlet obstruction   Subjective: Less abdominal pain during the night. Complains of mild epigastric and central lower abdominal pain at this time. Dilaudid to be given by RN. She passed a soft BM yesterday evening. No nausea or vomiting.    Objective:  Vital signs in last 24 hours: Temp:  [98.2 F (36.8 C)-98.7 F (37.1 C)] 98.7 F (37.1 C) (01/31 0600) Pulse Rate:  [80-99] 91 (01/31 0600) Resp:  [16-20] 16 (01/31 0600) BP: (126-144)/(81-98) 144/86 (01/31 0600) SpO2:  [92 %-95 %] 92 % (01/31 0600) Weight:  [72.3 kg] 72.3 kg (01/31 0600) Last BM Date: 05/13/18 General:   Alert,  Well-developed,    in NAD Heart:  Regular rate and rhythm; no murmurs Pulm: Lungs clear, slightly diminished RLL. No wheezing.  Abdomen: soft, mild distension, mild epigastric and central lower abdominal tenderness, lower abdominal hernia reducible.   Extremities:  Without edema. Neurologic:  Alert and  oriented x4;  grossly normal neurologically.  Intake/Output from previous day: 01/30 0701 - 01/31 0700 In: 3066.3 [P.O.:180; I.V.:1707.1; NG/GT:1179.2] Out: 0  Intake/Output this shift: No intake/output data recorded.  Lab Results: Recent Labs    05/13/18 0341 05/14/18 0349  WBC 11.0* 10.6*  HGB 9.0* 9.2*  HCT 28.1* 28.9*  PLT 737* 724*   BMET Recent Labs    05/13/18 0341 05/14/18 0349  NA 135 133*  K 3.8 3.9  CL 98 97*  CO2 26 26  GLUCOSE 159* 129*  BUN 8 8  CREATININE 0.69 0.55  CALCIUM 8.9 8.9   LFT Recent Labs    05/13/18 0341  PROT 7.3  ALBUMIN 2.7*  AST 25  ALT 11  ALKPHOS 115  BILITOT 0.6   Assessment / Plan:  * 58 yo female with acute on chronic pancreatitis, large pancreatic pseudocyst 20.2cm x 10cm resulting in gastric outlet obstruction which required a nasoenteric feeding tube for nutrition. The pseudocyst is immature, not yet walled off. Possible cystgastrostomy in 4  weeks. Lipase 277, continues to drift downward. -continue Osmolite tube feedings at 50cc/hr -continue clear liquids as tolerated, stop po liquids if nausea/vomiting or worsening abdominal pain occurs.  -Dr. Rush Landmark reviewed patient's records, to repeat an abd/pelvic CT with IV and PO contrast second week of Feb. 2020, consider cystgastrostomy in 4 weeks. - No signs of infection (WBC 11.0) , If she develops any sign of infection then IR to place a drain. Repeat CBC in am. -Most likely will require discharge to skilled rehab facility, further discharge planning to be determined.  *Anemia: stable  -Repeat CBC in am  *Abdominal pain is well controlled  -encouraged sitting up in chair/ ambulate in room and in hall  -Dilaudid as needed      LOS: 13 days   Noralyn Pick  05/15/2018, 10:06 AM

## 2018-05-15 NOTE — Progress Notes (Signed)
PROGRESS NOTE  Amanda Hahn ZOX:[id number] DOB: 07/28/1960 DOA: 05/02/2018 PCP: Patient, No Pcp Per   LOS: 13 days   Brief narrative: Patient is a 58 y.o.femalewith pancreatitis,diabetes mellitus type 2,hypertensionwho presented to hospital with complaints of nausea, vomiting abdominal pain. Of note patient was recently discharged from [facility] after treatment of acute pancreatitis. After discharge, she continued to have nausea,vomiting and severe abdominal pain. Due to persistent symptoms, patient decided to [facility]eto [facility]. In the ED, she was noted to have tenderness over the epigastric region with some guarding.Lipase was elevated andCT scan of the abdomen showed large pseudocyst measuring around 20 x 10 cm which was completely new compared to a CT scan done 1 week back when she was admitted at [facility]. This case was discussed with Dr. Ardis Hughs of our GI as well as Dr. Tera Helper of general surgery. No acute intervention were advised and patient was started onconservative treatment. Patient was then considered for admission to the hospital for IV fluids pain control and nutrition.  Hospital course complicated by worsening symptoms and elevated lipase level initially then started to trend down on 05/10/2018. NG tube placed on 05/08/2018 with tube feeding started which has been well-tolerated.  Assessment/Plan:  Principal Problem:   Peripancreatic fluid collection Active Problems:   Abdominal distension (gaseous)   Vomiting   Gastric outlet obstruction   Acute pancreatitis without infection or necrosis  Acute on chronic pancreatitis with large pseudocyst and concern for gastric outlet obstruction CT abdomen and pelvis with contrast done on 05/02/2018 revealed large pseudocyst at the esophageal hiatus compared with CT abdomen pelvis with contrast done on 04/23/2018. Abdomen 2 view x-ray done on 05/06/2018 revealed evidence of gastric outlet  obstruction and noted calcification of the pancreas. GI and general surgery following. Started NG tube feeding on 05/08/2018 and tolerating well.  Currently patient getting Osmolite tube feeding at 50 cc/h.  Recommendation from GI and surgery is to repeat an abdominal pelvic CT scan with IV and p.o. contrast in the second week of February and consideration of cystogastrostomy in 4 weeks. At this time, there is no signs of infection.  Continue to follow WBC and temperature trend.  If infection is suspected, will consider IR to place a drain. Repeat CBC in the morning.    Hypokalemia, resolving post repletion.  Normocytic anemia - chronic. Appears to be dilutional from IV fluid hydration. No sign of overt bleeding  Type 2 diabetes complicated by hypoglycemia. No recurrence of hypoglycemia.. Started tube feeding on 05/08/2018.  Hypertension - Blood pressure is stable. Continue amlodipine 10 mg daily, Lopressor 25 mg twice daily  VTE Prophylaxis: Lovenox subcu Code Status: Full Code  Disposition Plan: Pending improvement on pain level.  May not qualify for discharge to skilled nursing facility because of physical strength.  Anticipate discharge to home with home health 1 to 2 days.  Antibiotics: Antibiotics Given (last 72 hours)    None      Continuous Infusions: . lactated ringers 75 mL/hr at 05/15/18 0600    Scheduled Meds: . amLODipine  10 mg Oral Daily  . chlorhexidine  15 mL Mouth Rinse BID  . enoxaparin (LOVENOX) injection  40 mg Subcutaneous Q24H  . feeding supplement (OSMOLITE 1.5 CAL)  1,000 mL Per Tube Q24H  . insulin aspart  0-9 Units Subcutaneous Q6H  . mouth rinse  15 mL Mouth Rinse q12n4p  . metoprolol tartrate  25 mg Oral BID  . multivitamin with minerals  1 tablet Oral Daily  .  oxyCODONE  5 mg Per Tube Q6H  . polyethylene glycol  17 g Per Tube Daily  . sodium chloride flush  3 mL Intravenous Once    PRN meds: acetaminophen **OR** acetaminophen, bisacodyl,  HYDROmorphone (DILAUDID) injection, metoprolol tartrate, ondansetron (ZOFRAN) IV, phenol   Subjective: Patient was seen and examined this morning.  Middle-aged African-American female. Not in distress.   Objective: Vitals:   05/14/18 2200 05/15/18 0600  BP:  (!) 144/86  Pulse: 83 91  Resp: 16 16  Temp: 98.3 F (36.8 C) 98.7 F (37.1 C)  SpO2:  92%    Intake/Output Summary (Last 24 hours) at 05/15/2018 1136 Last data filed at 05/15/2018 0930 Gross per 24 hour  Intake 3246.25 ml  Output 0 ml  Net 3246.25 ml   Filed Weights   05/12/18 0500 05/14/18 0646 05/15/18 0600  Weight: 80 kg 74.4 kg 72.3 kg   Body mass index is 29.15 kg/m.   Physical Exam: GENERAL: Pleasant middle-aged African-American female.  Not in distress. HENT: No scleral pallor or icterus. Pupils equally reactive to light. Oral mucosa is moist NECK: is supple, no palpable thyroid enlargement. CHEST: Clear to auscultation. No crackles or wheezes. Non tender on palpation. Diminished breath sounds bilaterally. CVS: S1 and S2 heard, no murmur. Regular rate and rhythm. No pericardial rub. ABDOMEN: Soft, gaseous distention noted, bowel sounds are present.  No palpable hepato-splenomegaly. EXTREMITIES: No edema. CNS: Alert, awake, oriented x3 SKIN: warm and dry without rashes.  Data Review: I have personally reviewed the laboratory data and studies available.  Terrilee Croak, MD  [facility] 05/15/2018

## 2018-05-15 NOTE — Plan of Care (Signed)
Dilaudid PRN

## 2018-05-15 NOTE — Progress Notes (Signed)
PT Cancellation Note  Patient Details Name: Amanda Hahn MRN: [id number] DOB: [birth year]   Cancelled Treatment:    Reason Eval/Treat Not Completed: PT screened, no needs identified, will sign off   [facility] 05/15/2018, 1:57 PM

## 2018-05-16 ENCOUNTER — Inpatient Hospital Stay (HOSPITAL_COMMUNITY): Payer: Self-pay

## 2018-05-16 LAB — CBC WITH DIFFERENTIAL/PLATELET
Abs Immature Granulocytes: 0.03 10*3/uL (ref 0.00–0.07)
BASOS PCT: 1 %
Basophils Absolute: 0.1 10*3/uL (ref 0.0–0.1)
Eosinophils Absolute: 0.6 10*3/uL — ABNORMAL HIGH (ref 0.0–0.5)
Eosinophils Relative: 6 %
HCT: 31.1 % — ABNORMAL LOW (ref 36.0–46.0)
Hemoglobin: 10 g/dL — ABNORMAL LOW (ref 12.0–15.0)
Immature Granulocytes: 0 %
Lymphocytes Relative: 28 %
Lymphs Abs: 2.5 10*3/uL (ref 0.7–4.0)
MCH: 26.1 pg (ref 26.0–34.0)
MCHC: 32.2 g/dL (ref 30.0–36.0)
MCV: 81.2 fL (ref 80.0–100.0)
Monocytes Absolute: 0.6 10*3/uL (ref 0.1–1.0)
Monocytes Relative: 7 %
Neutro Abs: 5.2 10*3/uL (ref 1.7–7.7)
Neutrophils Relative %: 58 %
Platelets: 753 10*3/uL — ABNORMAL HIGH (ref 150–400)
RBC: 3.83 MIL/uL — AB (ref 3.87–5.11)
RDW: 14.6 % (ref 11.5–15.5)
WBC: 9 10*3/uL (ref 4.0–10.5)
nRBC: 0 % (ref 0.0–0.2)

## 2018-05-16 LAB — CREATININE, SERUM
CREATININE: 0.6 mg/dL (ref 0.44–1.00)
GFR calc Af Amer: >60 mL/min (ref >60)
GFR calc non Af Amer: >60 mL/min (ref >60)

## 2018-05-16 LAB — GLUCOSE, CAPILLARY
GLUCOSE-CAPILLARY: 141 mg/dL — AB (ref 70–99)
GLUCOSE-CAPILLARY: 185 mg/dL — AB (ref 70–99)
Glucose-Capillary: 166 mg/dL — ABNORMAL HIGH (ref 70–99)
Glucose-Capillary: 157 mg/dL — ABNORMAL HIGH (ref 70–99)
Glucose-Capillary: 159 mg/dL — ABNORMAL HIGH (ref 70–99)
Glucose-Capillary: 126 mg/dL — ABNORMAL HIGH (ref 70–99)
Glucose-Capillary: 165 mg/dL — ABNORMAL HIGH (ref 70–99)

## 2018-05-16 LAB — LIPASE, BLOOD: Lipase: 149 U/L — ABNORMAL HIGH (ref 11–51)

## 2018-05-16 MED ORDER — IOPAMIDOL (ISOVUE-300) INJECTION 61%
INTRAVENOUS | Status: AC
  Filled 2018-05-16: qty 50

## 2018-05-16 MED ORDER — LIDOCAINE HCL 1 % IJ SOLN
INTRAMUSCULAR | Status: AC
  Filled 2018-05-16: qty 20

## 2018-05-16 MED ORDER — BUTAMBEN-TETRACAINE-BENZOCAINE 2-2-14 % EX AERO
INHALATION_SPRAY | CUTANEOUS | Status: AC
  Filled 2018-05-16: qty 20

## 2018-05-16 MED ORDER — IOPAMIDOL (ISOVUE-300) INJECTION 61%
150.0000 mL | Freq: Once | INTRAVENOUS | Status: AC | PRN
  Administered 2018-05-16: 150 mL

## 2018-05-16 NOTE — Care Management Note (Addendum)
Case Management Note  Patient Details  Name: Amanda Hahn MRN: [id number] Date of Birth: 08-Feb-1961  Subjective/Objective:  acute on chronic pancreatitis with large pseudocyst and concern for gastric outlet obstruction             Action/Plan: NCM spoke to pt and explained [facility] will deliver tube feeding to home today between 11 to 3 am. [facility] has confirmed acceptance of pt with [facility] under charity program. Waiting for placement for new [facility].   05/16/2018 Notified [place] that pt will not dc until possibly Monday.   Expected Discharge Date:                Expected Discharge Plan:  [place]  In-House Referral:  NA  Discharge planning Services  CM Consult, [place] [facility]  Post Acute Care Choice:  Home Health Choice offered to:  Patient  DME Arranged:  Tube feeding pump, Tube feeding DME Agency:  [place]:  RN [facility] Agency:  [facility]  Status of Service:  Completed, signed off  If discussed at [employer], dates discussed:    Additional Comments:  Erenest Rasher, RN 05/16/2018, 2:39 PM

## 2018-05-16 NOTE — Progress Notes (Signed)
PROGRESS NOTE  Redonna Wilbert QPR:[id number] DOB: Aug 05, 1960 DOA: 05/02/2018 PCP: Amanda Hahn, No Pcp Per   LOS: 14 days   Brief narrative: Amanda Hahn is a 58 y.o.femalewith pancreatitis,diabetes mellitus type 2,hypertensionwho presented to hospital with complaints of nausea,vomiting abdominal pain. Of note Amanda Hahn was recently discharged from [facility] after treatment of acutepancreatitis. After discharge,she continued to have nausea,vomiting and severe abdominal pain. Due to persistent symptoms, Amanda Hahn decided to [facility]. In the ED, she was noted to have tenderness over the epigastric region with some guarding.Lipase was elevated andCT scan of the abdomen showed large pseudocyst measuring around 20 x 10 cm which was completely new compared to a CT scan done 1 week back when she was admitted at [facility]. This case was discussed with Dr. Ardis Hughs of our GI as well as Dr. Tera Helper of general surgery. No acute intervention were advised and Amanda Hahn was started onconservative treatment. Amanda Hahn was then considered for admission to the hospital for IV fluids pain control and nutrition.  Hospital course complicated by worsening symptoms and elevated lipase level initially then started to trend down on 05/10/2018. NG tube placed on 05/08/2018 with tube feeding started which has been well-tolerated.  Assessment/Plan:  Principal Problem:   Peripancreatic fluid collection Active Problems:   Abdominal distension (gaseous)   Vomiting   Gastric outlet obstruction   Acute pancreatitis without infection or necrosis  Acute on chronic pancreatitis with large pseudocyst and concern for gastric outlet obstruction CT abdomen and pelvis with contrast done on 05/02/2018 revealed large pseudocyst at the esophageal hiatus compared with CT abdomen pelvis with contrast done on 04/23/2018. Abdomen 2 view x-ray done on 05/06/2018 revealed evidence of gastric outlet  obstruction and noted calcification of the pancreas. GI and general surgery are following. Started nasoenteric tube feeding on 05/08/2018 and tolerating well.  Currently Amanda Hahn getting Osmolite tube feeding at 50 cc/h.  Recommendation from GI and surgery is to repeat an abdominal pelvic CT scan with IV and p.o. contrast in the second week of February and consideration of cystogastrostomy in 4 weeks. Recommendation from GI was to discharge home with nasoenteric tube feeding for the next few weeks and follow-up with general surgery. However, Amanda Hahn accidentally pulled her tube this morning.  Chest x-ray showed misplaced tube.  I discussed the case with IR on call Dr. Pascal Lux and requested for replacement with [place] tube.  Per Dr. Pascal Lux, on review of Amanda Hahn's images, the tip of the nasoenteric tube has always been on his stomach only and Amanda Hahn had never essentially had postpyloric feeding.  We will have to clarify this with GI on Monday.  If this is true, Amanda Hahn probably would not need nasoenteric feeding tube at discharge.  Hypokalemia,resolving post repletion.  Normocytic anemia - chronic. Appears to be dilutional from IV fluid hydration. No sign of overt bleeding.  Type 2 diabetes complicated by hypoglycemia. No recurrence of hypoglycemia.. Started tube feeding on 05/08/2018.  Hypertension - Blood pressure is stable. Continue amlodipine 10 mg daily, Lopressor 25 mg twice daily.  VTE Prophylaxis:Lovenox subcu Code Status: Full Code Disposition Plan:Hold discharge today.  Will discuss with GI on Monday.  Antibiotics: Antibiotics Given (last 72 hours)    None     Continuous Infusions:  . lactated ringers 75 mL/hr at 05/16/18 9935    Scheduled Meds: . amLODipine  10 mg Oral Daily  . chlorhexidine  15 mL Mouth Rinse BID  . enoxaparin (LOVENOX) injection  40 mg Subcutaneous Q24H  . feeding supplement (OSMOLITE  1.5 CAL)  1,000 mL Per Tube Q24H  . insulin aspart  0-9 Units Subcutaneous  Q6H  . iopamidol      . lidocaine      . mouth rinse  15 mL Mouth Rinse q12n4p  . metoprolol tartrate  25 mg Oral BID  . multivitamin with minerals  1 tablet Oral Daily  . oxyCODONE  5 mg Per Tube Q6H  . polyethylene glycol  17 g Per Tube Daily  . sodium chloride flush  3 mL Intravenous Once    PRN meds: acetaminophen **OR** acetaminophen, bisacodyl, HYDROmorphone (DILAUDID) injection, metoprolol tartrate, ondansetron (ZOFRAN) IV, phenol   Subjective: Amanda Hahn was seen and examined this morning.  Amanda Hahn was tearful because he needed to have another nasoenteric tube placed.  Amanda Hahn also anxious about having to manage tube feeding at home.  Objective: Vitals:   05/16/18 0557 05/16/18 1410  BP: 101/74 131/84  Pulse: 88 88  Resp: 17 18  Temp: 98.5 F (36.9 C) 98.4 F (36.9 C)  SpO2: 97% 96%   No intake or output data in the 24 hours ending 05/16/18 1436 Filed Weights   05/12/18 0500 05/14/18 0646 05/15/18 0600  Weight: 80 kg 74.4 kg 72.3 kg   Body mass index is 29.15 kg/m.   Physical Exam: GENERAL: Middle-aged African-American female HENT: No scleral pallor or icterus. Pupils equally reactive to light. Oral mucosa is moist NECK: is supple, no palpable thyroid enlargement. CHEST: Clear to auscultation. No crackles or wheezes. Non tender on palpation. Diminished breath sounds bilaterally. CVS: S1 and S2 heard, no murmur. Regular rate and rhythm. No pericardial rub. ABDOMEN: Soft, non-tender, bowel sounds are present. No palpable hepato-splenomegaly. EXTREMITIES: No edema. CNS: Alert, awake and oriented x3 SKIN: warm and dry without rashes.  Data Review: I have personally reviewed the laboratory data and studies available.  Terrilee Croak, MD  [facility] 05/16/2018

## 2018-05-16 NOTE — Progress Notes (Addendum)
Patient reports tasting tube feeding in her mouth. RN noted tube appears to be longer than expected for patient height.  Tube feeding turned off and MD notified.  Order placed for x-ray verification, which shows tube out of place.   1036:  RN reported results to MD, will remove tube entirely.  MD to place reinsertion orders.

## 2018-05-17 LAB — GLUCOSE, CAPILLARY
GLUCOSE-CAPILLARY: 157 mg/dL — AB (ref 70–99)
GLUCOSE-CAPILLARY: 170 mg/dL — AB (ref 70–99)
Glucose-Capillary: 119 mg/dL — ABNORMAL HIGH (ref 70–99)
Glucose-Capillary: 189 mg/dL — ABNORMAL HIGH (ref 70–99)
Glucose-Capillary: 153 mg/dL — ABNORMAL HIGH (ref 70–99)
Glucose-Capillary: 193 mg/dL — ABNORMAL HIGH (ref 70–99)

## 2018-05-17 LAB — LIPASE, BLOOD: Lipase: 150 U/L — ABNORMAL HIGH (ref 11–51)

## 2018-05-17 NOTE — Progress Notes (Signed)
Patient requested I call the MD and ask for an increase in pain medicine. I called Dr. Pietro Cassis and asked for the increase in Dilaudid. He ordered to hold the Osmolite tube feedings and did not increase the pain medicine.He also said she could have an extra dose of Dilaudid x1. He stated she will have an Abd. X-ray in the AM to see if she has a bowel obstruction which may be causing an increase in pain.

## 2018-05-17 NOTE — Progress Notes (Signed)
PROGRESS NOTE  Amanda Hahn ONG:[id number] DOB: Feb 02, 1961 DOA: 05/02/2018 PCP: Patient, No Pcp Per   LOS: 15 days   Brief narrative: Patient is a 58 y.o.femalewith PMH of pancreatitis,diabetes mellitus type 2,hypertensionwhopresented to the hospital on 05/02/2018 with complaints of nausea,vomiting abdominal pain. Of note patient was recently hospitalized  (1/9 -1/15) at [facility] for acutepancreatitis. Post discharge,she continued to have nausea,vomiting and severe abdominal pain and presented to our ED 3 days later on 05/02/2018.   In the ED, she was noted to have tenderness over the epigastric region with some guarding.Lipase was elevated andCT scan of the abdomen showed large pseudocyst measuring around 20 x 10 cm which was completely new compared to a CT scan done 1 week back. Patient was admitted for conservative management of pancreatitis.  Hospital course complicated by worsening symptoms, inability to tolerate oral feeding. Abdomen x-ray done on 05/06/2018 revealed evidence of gastric outlet obstruction and noted calcification of the pancreas. Because of GOO, nasoenteric tube was placed on 05/08/2018 and patient was started on Osmolite.  Subjective: Patient was seen and examined this morning.  Pleasant middle-aged African-American female.  Lying down in bed.  Not in distress.  Continues nasoenteric tube feeding.  Also is on clear liquid diet.  Had a good bowel movement today.  Denies any abdominal pain.  Assessment/Plan:  Principal Problem:   Peripancreatic fluid collection Active Problems:   Abdominal distension (gaseous)   Vomiting   Gastric outlet obstruction   Acute pancreatitis without infection or necrosis  Acute on chronic pancreatitis with large pseudocyst and concern for gastric outlet obstruction CT abdomen and pelvis with contrast done on 05/02/2018 revealed large pseudocyst at the esophageal hiatus compared with CT abdomen pelvis with  contrast done on 04/23/2018. GI and general surgery  consults were obtained.   Currently patient is on nasoenteric tube feeding as well as clear liquid diet by mouth.  Recommendation from GI and surgery is to repeat an abdominal pelvic CT scan with IV and p.o. contrast in the second week of February and consideration of cystogastrostomy in 4 weeks. Recommendation from GI was to discharge home with nasoenteric tube feeding for the next few weeks and follow-up with general surgery. However, patient accidentally pulled her tube and needed a replacement by IR on 2/1-20.  Per IR Dr. Pascal Lux, on review of patient's images, the tip of the nasoenteric tube has always been on his stomach only and patient had never essentially had postpyloric feeding.  If this is true, patient probably would not need nasoenteric feeding tube at discharge. I will clarify this with GI/surgery tomorrow.  ?  Small bowel obstruction - dilated small bowel loops seen in abdominal x-ray obtained yesterday and 05/16/2018.  Patient denies any abdominal pain.  She had a big bowel movement today.  Will repeat an x-ray tomorrow to ensure resolution of bowel dilatation.    Hypokalemia,resolving post repletion.  Type 2 diabetes complicated by hypoglycemia. No recurrenceof hypoglycemia.  Continue tube feeding.  Hypertension - Blood pressure is stable. Continue amlodipine 10 mg daily, Lopressor 25 mg twice daily.  Mobility: Encourage ambulation Diet: Clear liquid diet, Osmolite via nasoenteric tube at 50 mL/h DVT prophylaxis:  Lovenox Code Status:   Code Status: Full Code  Family Communication:  Family not present at bedside Disposition Plan:  Anticipate discharge to home in 1 to 2 days.  Antibiotics: . None  Infusions:  . lactated ringers 1,000 mL (05/17/18 1211)    Scheduled Meds: . amLODipine  10 mg  Oral Daily  . chlorhexidine  15 mL Mouth Rinse BID  . enoxaparin (LOVENOX) injection  40 mg Subcutaneous Q24H  . feeding  supplement (OSMOLITE 1.5 CAL)  1,000 mL Per Tube Q24H  . insulin aspart  0-9 Units Subcutaneous Q6H  . mouth rinse  15 mL Mouth Rinse q12n4p  . metoprolol tartrate  25 mg Oral BID  . multivitamin with minerals  1 tablet Oral Daily  . oxyCODONE  5 mg Per Tube Q6H  . polyethylene glycol  17 g Per Tube Daily  . sodium chloride flush  3 mL Intravenous Once    PRN meds: acetaminophen **OR** acetaminophen, bisacodyl, HYDROmorphone (DILAUDID) injection, metoprolol tartrate, ondansetron (ZOFRAN) IV, phenol   Consultants:  GI  Surgery  Procedures:  None  Objective: Vitals:   05/17/18 1154 05/17/18 1157  BP: 121/83 121/83  Pulse:  96  Resp:    Temp:    SpO2:      Intake/Output Summary (Last 24 hours) at 05/17/2018 1248 Last data filed at 05/17/2018 0814 Gross per 24 hour  Intake 1575.75 ml  Output -  Net 1575.75 ml   Filed Weights   05/12/18 0500 05/14/18 0646 05/15/18 0600  Weight: 80 kg 74.4 kg 72.3 kg   Body mass index is 29.15 kg/m.   Physical Exam: GENERAL: Pleasant, middle-aged, African-American female.  Not in distress HENT: No scleral pallor or icterus. Pupils equally reactive to light. Oral mucosa is moist NECK: is supple, no palpable thyroid enlargement. CHEST: Clear to auscultation. No crackles or wheezes. Non tender on palpation. Diminished breath sounds bilaterally. CVS: S1 and S2 heard, no murmur. Regular rate and rhythm. No pericardial rub. ABDOMEN: Soft, non-tender, bowel sounds are present. No palpable hepato-splenomegaly. EXTREMITIES: No edema. CNS: Alert, awake alert and x3 SKIN: warm and dry without rashes.  Data Review: I have personally reviewed the laboratory data and studies available.  CBC Latest Ref Rng & Units 05/16/2018 05/14/2018 05/13/2018  WBC 4.0 - 10.5 K/uL 9.0 10.6(H) 11.0(H)  Hemoglobin 12.0 - 15.0 g/dL 10.0(L) 9.2(L) 9.0(L)  Hematocrit 36.0 - 46.0 % 31.1(L) 28.9(L) 28.1(L)  Platelets 150 - 400 K/uL 753(H) 724(H) 737(H)   BMP Latest  Ref Rng & Units 05/16/2018 05/14/2018 05/13/2018  Glucose 70 - 99 mg/dL - 129(H) 159(H)  BUN 6 - 20 mg/dL - 8 8  Creatinine 0.44 - 1.00 mg/dL 0.60 0.55 0.69  Sodium 135 - 145 mmol/L - 133(L) 135  Potassium 3.5 - 5.1 mmol/L - 3.9 3.8  Chloride 98 - 111 mmol/L - 97(L) 98  CO2 22 - 32 mmol/L - 26 26  Calcium 8.9 - 10.3 mg/dL - 8.9 8.9     Terrilee Croak, MD  [facility] 05/17/2018

## 2018-05-18 ENCOUNTER — Inpatient Hospital Stay (HOSPITAL_COMMUNITY): Payer: Self-pay

## 2018-05-18 ENCOUNTER — Encounter: Payer: Self-pay | Admitting: Nurse Practitioner

## 2018-05-18 LAB — GLUCOSE, CAPILLARY
Glucose-Capillary: 122 mg/dL — ABNORMAL HIGH (ref 70–99)
Glucose-Capillary: 125 mg/dL — ABNORMAL HIGH (ref 70–99)
Glucose-Capillary: 132 mg/dL — ABNORMAL HIGH (ref 70–99)
Glucose-Capillary: 145 mg/dL — ABNORMAL HIGH (ref 70–99)
Glucose-Capillary: 169 mg/dL — ABNORMAL HIGH (ref 70–99)

## 2018-05-18 LAB — BASIC METABOLIC PANEL
Anion gap: 11 (ref 5–15)
BUN: 9 mg/dL (ref 6–20)
CO2: 24 mmol/L (ref 22–32)
Calcium: 9.1 mg/dL (ref 8.9–10.3)
Chloride: 102 mmol/L (ref 98–111)
Creatinine, Ser: 0.67 mg/dL (ref 0.44–1.00)
GFR calc Af Amer: >60 mL/min (ref >60)
GFR calc non Af Amer: >60 mL/min (ref >60)
GLUCOSE: 136 mg/dL — AB (ref 70–99)
Potassium: 4.3 mmol/L (ref 3.5–5.1)
Sodium: 137 mmol/L (ref 135–145)

## 2018-05-18 LAB — LIPASE, BLOOD: LIPASE: 105 U/L — AB (ref 11–51)

## 2018-05-18 LAB — MAGNESIUM: Magnesium: 2.1 mg/dL (ref 1.7–2.4)

## 2018-05-18 MED ORDER — OSMOLITE 1.5 CAL PO LIQD
1000.0000 mL | ORAL | Status: DC
  Administered 2018-05-18 – 2018-05-19 (×2): 1000 mL
  Filled 2018-05-18 (×2): qty 1000

## 2018-05-18 MED ORDER — OXYCODONE-ACETAMINOPHEN 7.5-325 MG PO TABS
1.0000 | ORAL_TABLET | ORAL | Status: DC | PRN
  Administered 2018-05-18 – 2018-05-19 (×3): 1 via ORAL
  Filled 2018-05-18 (×3): qty 1

## 2018-05-18 MED ORDER — HYDROMORPHONE HCL 1 MG/ML IJ SOLN
1.0000 mg | INTRAMUSCULAR | Status: DC | PRN
  Administered 2018-05-18 – 2018-05-19 (×6): 1 mg via INTRAVENOUS
  Filled 2018-05-18 (×6): qty 1

## 2018-05-18 NOTE — Progress Notes (Signed)
PROGRESS NOTE  Amanda Hahn ZOX:[id number] DOB: September 18, 1960 DOA: 05/02/2018 PCP: Patient, No Pcp Per   LOS: 16 days   Brief narrative: Patient is a 58 y.o.femalewith PMH of pancreatitis,diabetes mellitus type 2,hypertensionwhopresented to the hospital on 05/02/2018 with complaints of nausea,vomiting abdominal pain. Of note patient was recently hospitalized  (1/9 -1/15) at [facility] for acutepancreatitis. Post discharge,she continued to have nausea,vomiting and severe abdominal pain and presented to our ED 3 days later on 05/02/2018.   In the ED, she was noted to have tenderness over the epigastric region with some guarding.Lipase was elevated andCT scan of the abdomen showed large pseudocyst measuring around 20 x 10 cm which was completely new compared to a CT scan done 1 week back. Patient was admitted for conservative management of pancreatitis.  Her hospital course was prolonged because of persistent symptoms, inability to tolerate oral intake, dislodged nasoenteric tube and arrangements that had to be made for tube feeding at home.  Subjective: Patient was seen and examined this morning.  Pleasant middle-aged African-American female.  Not in distress today.  No abdominal pain this morning.  Assessment/Plan:  Principal Problem:   Peripancreatic fluid collection Active Problems:   Abdominal distension (gaseous)   Vomiting   Gastric outlet obstruction   Acute pancreatitis without infection or necrosis  Acute on chronic pancreatitis with large pseudocyst and concern for gastric outlet obstruction CT abdomen and pelvis with contrast done on 05/02/2018 revealed large pseudocyst at the esophageal hiatus compared with CT abdomen pelvis with contrast done on 04/23/2018. GI and general surgery consults were obtained.   Abdomen x-ray done on 05/06/2018 revealed evidence of gastric outlet obstruction and noted calcification of the pancreas. Because of GOO, nasoenteric  tube was placed on 05/08/2018 and patient was started on Osmolite. On 2/1, nasoenteric tube was dislodged and needed to be replaced by IR.  Patient continues to have intermittent abdominal pain.  X-ray on 2/1 showed dilated small bowel loops suspicious of bowel obstruction.  However, patient had a big bowel movement and subsequent x-ray showed improvement. Currently, patient continues to remain on nasoenteric tube feeding as well as clear liquid diet by mouth.  Recommendation from GI and surgery is to repeat an abdominal pelvic CT scan with IV and p.o. contrast in the second week of February and consideration of cystogastrostomy in 4 weeks. Follow up in office with Dr. Rush Landmark in 2 weeks with a plan for cyst gastrostomy with Dr. Rush Landmark in 3 weeks. I called Dr. Donneta Romberg office and made an appointment for her for Wednesday, May 27, 2018 at 9:00 am.   Pain management - patient has been requiring IV Dilaudid very frequently here in the hospital. I discussed with the patient today. We will stop using IV Dilaudid and switch to oral oxycodone only. Patient was not using pain medicine in the past prior to pancreatitis. I made her aware about the risk of getting opiate tolerance and addiction. Patient understands and agrees with the plan.  Hypokalemia -resolved post repletion.  Type 2 diabetes complicated by hypoglycemia.  Metformin on hold.  No recurrenceof hypoglycemia.  Continue tube feeding.  Hypertension - Blood pressure is stable. Continue amlodipine 10 mg daily, Lopressor 25 mg twice daily.  Mobility: Encourage ambulation Diet: Clear liquid diet, Osmolite via nasoenteric tube at 50 mL/h DVT prophylaxis: Lovenox Code Status:  Code Status: Full Code  Family Communication: Family not present at bedside Disposition Plan: Anticipate discharge to home in 1 to 2 days.  Consultants:  GI  IR  Procedures:  None  Antimicrobials: None  Infusions:  . lactated ringers 1,000 mL  (05/17/18 1211)   Scheduled Meds: . amLODipine  10 mg Oral Daily  . chlorhexidine  15 mL Mouth Rinse BID  . enoxaparin (LOVENOX) injection  40 mg Subcutaneous Q24H  . insulin aspart  0-9 Units Subcutaneous Q6H  . mouth rinse  15 mL Mouth Rinse q12n4p  . metoprolol tartrate  25 mg Oral BID  . multivitamin with minerals  1 tablet Oral Daily  . polyethylene glycol  17 g Per Tube Daily  . sodium chloride flush  3 mL Intravenous Once    PRN meds: acetaminophen **OR** acetaminophen, bisacodyl, metoprolol tartrate, ondansetron (ZOFRAN) IV, oxyCODONE-acetaminophen, phenol   Objective: Vitals:   05/18/18 0945 05/18/18 0948  BP: (!) 146/85 (!) 146/85  Pulse:  89  Resp:    Temp:    SpO2:      Intake/Output Summary (Last 24 hours) at 05/18/2018 1002 Last data filed at 05/18/2018 0843 Gross per 24 hour  Intake 1110.04 ml  Output -  Net 1110.04 ml   Filed Weights   05/12/18 0500 05/14/18 0646 05/15/18 0600  Weight: 80 kg 74.4 kg 72.3 kg   Body mass index is 29.15 kg/m.   Physical Exam: GENERAL: Pleasant, middle-aged African-American female.  Not in distress at this time. HENT: No scleral pallor or icterus. Pupils equally reactive to light. Oral mucosa is moist NECK: is supple, no palpable thyroid enlargement. CHEST: Clear to auscultation. No crackles or wheezes. Non tender on palpation.  CVS: S1 and S2 heard, no murmur. Regular rate and rhythm. No pericardial rub. ABDOMEN: Soft, minimal epigastric tenderness, bowel sounds are present. No palpable hepato-splenomegaly. EXTREMITIES: No edema. CNS: Alert, awake monitor x3 SKIN: warm and dry without rashes.  Data Review: I have personally reviewed the laboratory data and studies available.  CBC Latest Ref Rng & Units 05/16/2018 05/14/2018 05/13/2018  WBC 4.0 - 10.5 K/uL 9.0 10.6(H) 11.0(H)  Hemoglobin 12.0 - 15.0 g/dL 10.0(L) 9.2(L) 9.0(L)  Hematocrit 36.0 - 46.0 % 31.1(L) 28.9(L) 28.1(L)  Platelets 150 - 400 K/uL 753(H) 724(H) 737(H)    BMP Latest Ref Rng & Units 05/18/2018 05/16/2018 05/14/2018  Glucose 70 - 99 mg/dL 136(H) - 129(H)  BUN 6 - 20 mg/dL 9 - 8  Creatinine 0.44 - 1.00 mg/dL 0.67 0.60 0.55  Sodium 135 - 145 mmol/L 137 - 133(L)  Potassium 3.5 - 5.1 mmol/L 4.3 - 3.9  Chloride 98 - 111 mmol/L 102 - 97(L)  CO2 22 - 32 mmol/L 24 - 26  Calcium 8.9 - 10.3 mg/dL 9.1 - 8.9   Terrilee Croak, MD  [facility] 05/18/2018

## 2018-05-18 NOTE — Progress Notes (Addendum)
[facility] Progress Note  CC:  ETOH pancreatitis with pseudocyst, gastric outlet obstruction  Subjective: She stated " I feel great". No abdominal pain at this time. Passed a large soft brown BM this morning. Drinking clear liquids.    Objective:  Vital signs in last 24 hours: Temp:  [98.3 F (36.8 C)-99 F (37.2 C)] 98.3 F (36.8 C) (02/03 0607) Pulse Rate:  [81-96] 81 (02/03 0607) Resp:  [16-18] 18 (02/03 0607) BP: (111-121)/(71-86) 114/86 (02/03 0607) SpO2:  [93 %-98 %] 96 % (02/03 0607) Last BM Date: 05/17/18 General:   Alert,  Well-developed,    in NAD Heart:  Regular rate and rhythm; no murmurs Pulm: Clear, slightly decreased RLL. Abdomen:  Soft,mild tenderness epigastric area, no rebound or guarding, RLQ hernia mildly tender, midline abdominal scar intact, + BS x 4 quads. Extremities:  Without edema. Neurologic:  Alert and  oriented x4;  grossly normal neurologically. Psych:  Alert and cooperative. Normal mood and affect.  Intake/Output from previous day: 02/02 0701 - 02/03 0700 In: 1425.8 [P.O.:637; I.V.:471.3; NG/GT:317.5] Out: -  Intake/Output this shift: No intake/output data recorded.  Lab Results: Recent Labs    05/16/18 0335  WBC 9.0  HGB 10.0*  HCT 31.1*  PLT 753*   BMET Recent Labs    05/16/18 0335 05/18/18 0506  NA  --  137  K  --  4.3  CL  --  102  CO2  --  24  GLUCOSE  --  136*  BUN  --  9  CREATININE 0.60 0.67  CALCIUM  --  9.1     Dg Abd 1 View  Result Date: 05/16/2018 CLINICAL DATA:  Encounter for feeding tube placement EXAM: ABDOMEN - 1 VIEW COMPARISON:  05/08/2018 FINDINGS: Interval removal of weighted tip enteric tube. There is marked distension a patulous appearing loop of small bowel in the left mid hemiabdomen measuring approximately 5 cm in diameter. Large colonic stool burden without evidence of enteric obstruction. No supine evidence of pneumoperitoneum. No pneumatosis or portal venous gas No definitive  abnormal intra-abdominal calcifications Degenerative change of the lower lumbar spine. IMPRESSION: 1. Interval removal of enteric tube. 2. Marked distension of a patulous appearing loop of small bowel within the left mid hemiabdomen, nonspecific though could be seen in the setting of a developing small bowel obstruction. Continued attention on follow-up is advised. Electronically Signed   By: Sandi Mariscal M.D.   On: 05/16/2018 10:00   Dg Abd Portable 2v  Result Date: 05/18/2018 CLINICAL DATA:  Vomiting EXAM: PORTABLE ABDOMEN - 2 VIEW COMPARISON:  Two days ago FINDINGS: Feeding tube tip in the descending duodenum. Contrast from prior abdominal CT is seen throughout the nondistended colon. Colonic diverticulosis. No evident pneumoperitoneum. No obstructive small bowel distention. IMPRESSION: 1. Normal bowel gas pattern. 2. Feeding tube tip at the descending duodenum. Electronically Signed   By: Monte Fantasia M.D.   On: 05/18/2018 05:07   Dg Intro Long Gi Tube  Result Date: 05/16/2018 INDICATION: History of pancreatic pseudocyst. Please place feeding tube for post pyloric feeding EXAM: INTRO LONG GI TUBE COMPARISON:  None. CONTRAST:  183mL ISOVUE-300 IOPAMIDOL (ISOVUE-300) INJECTION 61% FLUOROSCOPY TIME:  8 minutes, 12 seconds (878.6  MGy) COMPLICATIONS: None immediate PROCEDURE: The weighted tip enteric tube was lubricated with viscous lidocaine inserted into the right nostril. Under intermittent fluoroscopic guidance and with the use of both an Amplatz and stiff glidewire, the weighted tip enteric tube was advanced through the stomach with  tip ultimately terminating within the mid aspect of the horizontal segment of the duodenum. Contrast injection was performed. A spot fluoroscopic image was saved for documentation purposes. The tube was affixed to the patient's nose with tape. The patient tolerated the procedure well without immediate postprocedural complication. FINDINGS: There was difficulty advancing the  weighted tip enteric tube through the gastric outlet however this is ultimately achieved with tip ultimately terminating within horizontal segment of the duodenum. IMPRESSION: Successful fluoroscopic guided placement of weighted tip enteric tube with tip terminating within the horizontal segment of the duodenum. The tube is ready for immediate use. Electronically Signed   By: Sandi Mariscal M.D.   On: 05/16/2018 14:45    Assessment / Plan:  1) 58 yo female with acute on chronic pancreatitis, large pancreatic pseudocyst 20.2cm x 10cm resulting in gastric outlet obstruction which requireda nasoenteric feeding tube for nutrition. The pseudocyst is immature, not yet walled off. Possible cyst gastrostomy in 3 weeks. Her nasoenteric tube fell out over the weekend. IR placed a new post pyloric nasoenteric feeding tube. Note, prior nasoenteric tube was not placed post pyloric as evidenced by her prior xrays. Lipase levels continue to drift downward. Lipase 105.  -Restart Osmolite tube feedings @ 50cc/hr, may also continue clear liquids po as tolerated. -Discharge home date to be determined, patient will require Nasoenteric Tube feed education and home pump prior to discharge, case manager and hospitalist to facilitate arrangements.  -Repeat abdominal/pelvic CT with oral and IV contrast  next week as out patient. -Follow up in office with Dr. Rush Landmark in 2 weeks, plan for cyst gastrostomy with  Dr. Rush Landmark in 3 weeks.  2) Abdominal pain: Increased abdominal pain over the weekend. Abdominal Xray 05/16/2018 showed marked distension of a patulous appearing loop of small bowel within the left mid hemiabdomen, nonspecific though could be seen in the setting of a developing small bowel obstruction, most likely regional ileus. A repeat abdominal xray this am showed a normal small bowel, the feeding tube tip is in the duodenum. Of note, contrast from prior abdominal CT is seen throughout the nondistended colon. She  passed a large BM this morning.  -continue to monitor pain, Oxycodone as needed -no evidence of infectious process at this time -encourage ambulation in hall    LOS: 16 days   Noralyn Pick  05/18/2018, 7:53 AM  I have discussed the case with the PA, and that is the plan I formulated. I personally interviewed and examined the patient.  Severe alcoholic pancreatitis with large pseudocyst causing gastric outlet obstruction.  The patient's nasoduodenal tube is now properly positioned, tube feeding will be resumed.  She had a bowel movement earlier today and both clinically and radiographically appears to have had resolution of a regional small bowel ileus. She could be discharged home with a nasoduodenal tube by her nutrition per dietary service recommendations and in coordination with case management to provide home health.  She will most likely need this for at least a week, perhaps 2 if she can tolerate it that long.  By then, the edema may have improved enough that it can be removed and tolerated liquid diet while waiting for the pseudocyst to mature and be amenable to endoscopic drainage. Our office will coordinate follow-up office visit and imaging with Dr. Rush Landmark when patient is discharged.   Total time 25 minutes.  Nelida Meuse III Office: [phone]

## 2018-05-19 ENCOUNTER — Telehealth: Payer: Self-pay

## 2018-05-19 LAB — GLUCOSE, CAPILLARY
Glucose-Capillary: 171 mg/dL — ABNORMAL HIGH (ref 70–99)
Glucose-Capillary: 157 mg/dL — ABNORMAL HIGH (ref 70–99)

## 2018-05-19 MED ORDER — OSMOLITE 1.5 CAL PO LIQD: 50.0000 mL/h | ORAL | 0 refills | Status: DC

## 2018-05-19 MED ORDER — OXYCODONE-ACETAMINOPHEN 5-325 MG PO TABS: 1.0000 | ORAL_TABLET | Freq: Four times a day (QID) | ORAL | 0 refills | Status: AC | PRN

## 2018-05-19 NOTE — Progress Notes (Signed)
Nutrition Follow-up  INTERVENTION:   Continue Osmolite 1.5 @ 50 ml/hr via NGT.  Provide 1800 kcal, 75g protein and 914 ml H2O.  Patient to meet fluid needs via clear liquids PO.  NUTRITION DIAGNOSIS:   Increased nutrient needs related to (acute on chronic ETOH pancreatitis) as evidenced by estimated needs.  Ongoing.  GOAL:   Patient will meet greater than or equal to 90% of their needs  Meeting with TF.  MONITOR:   Labs, Weight trends, TF tolerance, I & O's  ASSESSMENT:   58 y.o. female with pancreatitis, diabetes mellitus type 2, hypertension presented to hospital with complaints of nausea vomiting abdominal pain.   1/24: Cortrak placement -post-pyloric via IR 1/27: Started on clear liquids 2/1: NGT was pulled out 2/2: Post-pyloric NGT replaced, tip in descending duodenum   TF resumed  Patient in room, with Osmolite 1.5 @ 50 ml/hr. Pt continues to tolerate this rate well. States she is having some abdominal pain but this is controlled by medication.  Per GI note, pt to continue TF via NGT pending cystgastrostomy. Pt with no questions regarding TF at home.  Medications: Multivitamin with minerals daily, Miralax packet daily, Lactated Ringers infusion @ 75 ml/hr  Labs reviewed: CBGs: 157-171  Diet Order:   Diet Order            Diet clear liquid Room service appropriate? Yes; Fluid consistency: Thin  Diet effective now              EDUCATION NEEDS:   Education needs have been addressed  Skin:  Skin Assessment: Reviewed RN Assessment  Last BM:  2/3  Height:   Ht Readings from Last 1 Encounters:  05/02/18 5\' 2"  (1.575 m)    Weight:   Wt Readings from Last 1 Encounters:  05/19/18 74.4 kg    Ideal Body Weight:  50 kg  BMI:  Body mass index is 30 kg/m.  Estimated Nutritional Needs:   Kcal:  1500-1700  Protein:  70-80g  Fluid:  1.7L/day  Clayton Bibles, MS, RD, LDN [facility] [facility] Pager: [phone] After Hours  Pager: [phone]

## 2018-05-19 NOTE — Telephone Encounter (Signed)
-----   Message from Irving Copas., MD sent at 05/19/2018  8:57 AM EST ----- Regarding: Patient for follow up Dear Chong Sicilian, I hope you are well. Can we work on arranging a CT-Abdomen/Pelvis with IV/PO contrast to be done 1-2 days before clinic visit to discuss timing of possible EUS cystgastrostomy if still having issues. Thanks. Chester Holstein

## 2018-05-19 NOTE — Telephone Encounter (Signed)
My understanding is that she will be discharged hopefully before the weekend. So I would just have it done 1-2 days before the scheduled clinic visit. If she ends up not being discharged and is in the hospital over the weekend then they can probably get it done before our clinic visit. FYI to Dr. Loletha Carrow and PA Berniece Pap incase she is not discharged. Thanks for checking, Patty.

## 2018-05-19 NOTE — Progress Notes (Addendum)
[facility] Progress Note  CC:  ETOH pancreatitis with pseudocyst, gastric outlet obstruction  Subjective: Slept most of the night. Awakened at 7am with central upper abdominal pain relieved after receiving Dilaudid 1mg  IV. Percocet did not reduce her pain when given yesterday. No BM yet this am.   Objective:  Vital signs in last 24 hours: Temp:  [98.8 F (37.1 C)-99.1 F (37.3 C)] 98.8 F (37.1 C) (02/04 0431) Pulse Rate:  [79-89] 84 (02/04 0431) Resp:  [17-19] 17 (02/04 0431) BP: (123-146)/(83-85) 123/83 (02/04 0431) SpO2:  [95 %-98 %] 95 % (02/04 0431) Weight:  [74.4 kg] 74.4 kg (02/04 0600) Last BM Date: 05/18/18 General:   Alert,  Well-developed,    in NAD Heart:  Regular rate and rhythm; no murmurs Pulm: lungs clear throughout. Abdomen:  Soft, nontender and nondistended. Normal bowel sounds, without guarding, and without rebound. RLQ hernia reducible. Central lower abdominal scar intact.  Extremities:  Without edema. Neurologic:  Alert and  oriented x4;  grossly normal neurologically. Psych:  Alert and cooperative. Normal mood and affect.  Intake/Output from previous day: 02/03 0701 - 02/04 0700 In: 5602.9 [P.O.:1274; I.V.:2817.2; NG/GT:1511.7] Out: 700 [Urine:700] Intake/Output this shift: No intake/output data recorded.  BMET Recent Labs    05/18/18 0506  NA 137  K 4.3  CL 102  CO2 24  GLUCOSE 136*  BUN 9  CREATININE 0.67  CALCIUM 9.1    Dg Abd Portable 2v  Result Date: 05/18/2018 CLINICAL DATA:  Vomiting EXAM: PORTABLE ABDOMEN - 2 VIEW COMPARISON:  Two days ago FINDINGS: Feeding tube tip in the descending duodenum. Contrast from prior abdominal CT is seen throughout the nondistended colon. Colonic diverticulosis. No evident pneumoperitoneum. No obstructive small bowel distention. IMPRESSION: 1. Normal bowel gas pattern. 2. Feeding tube tip at the descending duodenum. Electronically Signed   By: Monte Fantasia M.D.   On: 05/18/2018 05:07     Assessment / Plan: 1) 58 yo female with acute on chronic pancreatitis, large pancreatic pseudocyst 20.2cm x 10cm resulting in gastric outlet obstruction which requireda nasoenteric feeding tube for nutrition. The pseudocyst is immature, not yet walled off. Possible cyst gastrostomy in 3 weeks. Her nasoenteric tube fell out over the weekend. IR placed a new post pyloric nasoenteric feeding tube. Note, prior nasoenteric tube was not placed post pyloric as evidenced by her prior xrays. Lipase levels continue to drift downward. -Continue Osmolite Tf @ 50cc/hr -Continue LR @ 75cc/hr -Dilaudid IV as needed, hospitalist to mange po pain med for discharge -Repeat abdominal/pelvic CT with oral and IV contrast  early next week as out patient, I will contact Dr. Rush Landmark to facilitate CT order. -Patient has a follow up appointment with Dr. Kirk Ruths. Feb. At [facility] for cyst gastrostomy in 2 to 3 weeks. -Case Manager to facilitate tube feeding education and TF pump management prior to discharge.  -No Alcohol discussed, she reported drinking heavily from 2011 to 2016 or 2017, reported having a "few drinks" prior to her hospital admission.  -Dr. Loletha Carrow to verify if patient ok to go home today or tomorrow.      LOS: 17 days   Noralyn Pick  05/19/2018, 8:30 AM    I have discussed the case with the PA, and that is the plan I formulated. I personally interviewed and examined the patient.  Patient is clinically stable from yesterday.  She will be going home on nasoduodenal tube feeds as outlined in yesterday's note.  From what we  understand, that has been set up for her at home with case management.  She has a plan for outpatient follow-up imaging and office appointment with Dr. Rush Landmark.  Per my note yesterday, since the patient has the logistics of home tube feeding set up, she can be discharged from a GI perspective.     Nelida Meuse III Office: [phone]

## 2018-05-19 NOTE — Discharge Summary (Signed)
Physician Discharge Summary  Amanda Hahn QBV:[id number] DOB: April 09, 1961 DOA: 05/02/2018  PCP: Patient, No Pcp Per  Admit date: 05/02/2018 Discharge date: 05/19/2018  Admitted From: Home Discharge disposition: Home with home health  Recommendations for Outpatient Follow-Up:   1. Follow-up appointment has been arranged with Dr. Rush Landmark 2. Also needs to follow-up with the transitional care clinic, does not have a PCP.  Discharge Diagnosis:   Principal Problem:   Peripancreatic fluid collection Active Problems:   Gastric outlet obstruction  Discharge Condition: Improved. Diet recommendation: Tube feeding with Osmolite at 50 mL/h.  Also allowed clear liquids. Wound care: None. Code status: Full.  History of Present Illness:   Patient is a 58 y.o.femalewith PMH ofpancreatitis,diabetes mellitus type 2,hypertensionwhop[facility]with complaints of nausea,vomiting abdominal pain. Of note [facility] Post[facility]charge,she continued to have nausea,vomiting and severe abdominal pain and presented to our ED 3 days later on 05/02/2018. In the ED, she was noted to have tenderness over the epigastric region with some guarding.Lipase was elevated andCT scan of the abdomen showed large pseudocyst measuring around 20 x 10 cm which was completely new compared to a CT scan done 1 week back.Patient was admitted for conservative management of pancreatitis.  Her hospital course was prolonged because of persistent symptoms, inability to tolerate oral intake, dislodged nasoenteric tube and arrangements that had to be made for tube feeding at home.   Hospital Course:   Acute on chronic pancreatitis with large pseudocyst and concern for gastric outlet obstruction CT abdomen and pelvis with contrast done on 05/02/2018 revealed large pseudocyst at the esophageal hiatus  compared with CT abdomen pelvis with contrast done on 04/23/2018. Abdomen x-ray done on 05/06/2018 revealed evidence of gastric outlet obstruction and noted calcification of the pancreas. GI and general surgeryconsults were obtained.  Because ofGOO,nasoenterictube wasplaced on 1/24/2020and patient was started on Osmolite. On 2/1, nasoenteric tube was dislodged and needed to be replaced by IR.  X-ray on 2/1 showed dilated small bowel loops suspicious of bowel obstruction.  However, patient had a big bowel movement and subsequent x-ray showed improvement. Currently, patient continues to remain on nasoenteric tube feeding as well as clear liquid diet by mouth. Recommendation from GI and surgery is to repeat an abdominal pelvic CT scan with IV and p.o. contrast in the second week of February and consideration of cystogastrostomy in 4 weeks. Follow up in office with Dr. Rush Landmark in 2 weeks with a plan for cyst gastrostomy with Dr. Rush Landmark in 3 weeks. I called Dr. Donneta Romberg office and made an appointment for her for Wednesday, May 27, 2018 at 9:00 am.  Home health care nurse and tube feeding arrangements have been made.  Pain management - patient has been requiring IV Dilaudid very frequently here in the hospital.  Patient understands the need to transition to oral pain medicine for better pain control regimen at home.  Patient also needs to minimize use of pain medication.  I made her aware about the risk of getting opiate tolerance and addiction. Patient understands and agrees with the plan.  At discharge, I wrote a prescription for Percocet 5/325 mg every 6 hours for 5 days.  Patient may need refill by transitional care clinic or GI backslash surgery and follow-up.  Hypokalemia -resolved post repletion.  Type 2 diabetes complicated by hypoglycemia.  No recurrenceof hypoglycemia.Continue tube feeding.  Resume metformin post discharge.  Hypertension - Blood pressure is stable.  Continue amlodipine 10 mg daily, Lopressor 25 mg twice  daily.  Stable for discharge to home today.  Medical Consultants:    GI  Surgery   Discharge Exam:   Vitals:   05/18/18 2017 05/19/18 0431  BP: 130/84 123/83  Pulse: 88 84  Resp: 19 17  Temp: 99 F (37.2 C) 98.8 F (37.1 C)  SpO2: 95% 95%   Vitals:   05/18/18 1309 05/18/18 2017 05/19/18 0431 05/19/18 0600  BP: 126/85 130/84 123/83   Pulse: 79 88 84   Resp: 18 19 17    Temp: 99.1 F (37.3 C) 99 F (37.2 C) 98.8 F (37.1 C)   TempSrc: Oral     SpO2: 98% 95% 95%   Weight:    74.4 kg  Height:        General exam: Appears calm and comfortable.  Has nasoenteric tube functioning. Respiratory system: Clear to auscultation. Respiratory effort normal. Cardiovascular system: S1 & S2 heard, RRR. No JVD,  rubs, gallops or clicks. No murmurs. Gastrointestinal system: Abdomen is nondistended, soft and nontender. No organomegaly or masses felt. Normal bowel sounds heard. Central nervous system: Alert and oriented. No focal neurological deficits. Extremities: No clubbing,  or cyanosis. No edema. Skin: No rashes, lesions or ulcers. Psychiatry: Judgement and insight appear normal. Mood & affect appropriate.    The results of significant diagnostics from this hospitalization (including imaging, microbiology, ancillary and laboratory) are listed below for reference.     Procedures and Diagnostic Studies:   Ct Abdomen Pelvis W Contrast  Addendum Date: 05/12/2018   ADDENDUM REPORT: 05/12/2018 15:38 ADDENDUM: Patient has a somewhat firm palpable mass located over the mid pelvic region. This correlates with an anterior abdominal wall hernia containing fat and fluid located at the level of the mid to lower pelvis. There is a small defect in the linea alba between the rectus muscles. The hernia measures a maximum of 4.6 cm Electronically Signed   By: Marijo Sanes M.D.   On: 05/12/2018 15:38   Result Date: 05/12/2018 CLINICAL DATA:   Patient was recently diagnosed with pancreatitis April 23, 2018. Continue epigastric pain. EXAM: CT ABDOMEN AND PELVIS WITH CONTRAST TECHNIQUE: Multidetector CT imaging of the abdomen and pelvis was performed using the standard protocol following bolus administration of intravenous contrast. CONTRAST:  128mL ISOVUE-300 IOPAMIDOL (ISOVUE-300) INJECTION 61% COMPARISON:  April 23, 2018 FINDINGS: Lower chest: Multiloculated mass is identified at the esophageal hiatus. This is unchanged compared prior exam. Favor pancreatic pseudocyst. Mild atelectasis of right lung base is noted. The heart size is enlarged. Hepatobiliary: There is a small cyst at the gallbladder fossa unchanged compared prior exam. Fatty infiltration of liver is noted. The gallbladder is normal. The biliary tree is normal. 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 Calcifications are again identified in the pancreatic head. Spleen: There is a cyst in the spleen unchanged. The spleen is otherwise unremarkable. Adrenals/Urinary Tract: 2.2 cm mass in the left adrenal gland is unchanged. The right adrenal gland is normal. The bilateral kidneys demonstrate no hydronephrosis. There are small simple cysts in both kidneys. The bladder is partial decompressed without abnormality. Stomach/Bowel: There is dilatation of the duodenum likely due to focal ileus as resolve adjacent pancreatitis and pancreatic pseudocyst. The mid to distal small bowel loops are normal in caliber. There is diverticulosis of colon without diverticulitis. The appendix is normal. Stomach is stable. Vascular/Lymphatic: Aortic atherosclerosis. No enlarged abdominal or pelvic lymph nodes. Reproductive: Partial calcified uterine fibroid is noted. The  adnexa are normal. Other: None. Musculoskeletal: No acute abnormality is noted. 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 The previously noted complicated cystic mass at the esophageal hiatus is unchanged, favor pancreatic pseudocyst. Electronically Signed: By: Abelardo Diesel M.D. On: 05/02/2018 13:38     Labs:   Basic Metabolic Panel: Recent Labs  Lab 05/13/18 0341 05/14/18 0349 05/16/18 0335 05/18/18 0506  NA 135 133*  --  137  K 3.8 3.9  --  4.3  CL 98 97*  --  102  CO2 26 26  --  24  GLUCOSE 159* 129*  --  136*  BUN 8 8  --  9  CREATININE 0.69 0.55 0.60 0.67  CALCIUM 8.9 8.9  --  9.1  MG 2.1  --   --  2.1  PHOS 4.0  --   --   --    GFR Estimated Creatinine Clearance: 71.5 mL/min (by C-G formula based on SCr of 0.67 mg/dL). Liver Function Tests: Recent Labs  Lab 05/13/18 0341  AST 25  ALT 11  ALKPHOS 115  BILITOT 0.6  PROT 7.3  ALBUMIN 2.7*   Recent Labs  Lab 05/14/18 0349 05/15/18 0822 05/16/18 0335 05/17/18 0510 05/18/18 0506  LIPASE 362* 277* 149* 150* 105*   No results for input(s): AMMONIA in the last 168 hours. Coagulation profile No results for input(s): INR, PROTIME in the last 168 hours.  CBC: Recent Labs  Lab 05/13/18 0341 05/14/18 0349 05/16/18 0335  WBC 11.0* 10.6* 9.0  NEUTROABS  --  6.7 5.2  HGB 9.0* 9.2* 10.0*  HCT 28.1* 28.9* 31.1*  MCV 83.9 84.3 81.2  PLT 737* 724* 753*   Cardiac Enzymes: No results for input(s): CKTOTAL, CKMB, CKMBINDEX, TROPONINI in the last 168 hours. BNP: Invalid input(s): POCBNP CBG: Recent Labs  Lab 05/18/18 1118 05/18/18 1721 05/18/18 2342 05/19/18 0626 05/19/18 1128  GLUCAP 125* 145* 169* 171* 157*   D-Dimer No results for input(s): DDIMER in the last 72 hours. Hgb A1c No results for input(s): HGBA1C in the last 72 hours. Lipid Profile No results for input(s): CHOL, HDL, LDLCALC, TRIG, CHOLHDL, LDLDIRECT in the last 72 hours. Thyroid function studies No results for input(s): TSH, T4TOTAL, T3FREE, THYROIDAB in the last 72 hours.  Invalid input(s): FREET3 Anemia work up No results for  input(s): VITAMINB12, FOLATE, FERRITIN, TIBC, IRON, RETICCTPCT in the last 72 hours. Microbiology No results found for this or any previous visit (from the past 240 hour(s)).   Discharge Instructions:   Discharge Instructions    Face-to-face encounter (required for Medicare/Medicaid patients)   Complete by:  As directed    I Xochil Shanker certify that this patient is under my care and that I, or a nurse practitioner or physician's assistant working with me, had a face-to-face encounter that meets the physician face-to-face encounter requirements with this patient on 05/16/2018. The encounter with the patient was in whole, or in part for the following medical condition(s) which is the primary reason for home health care (List medical condition): need of NG tube feeding for 3-4 weeks.   The encounter with the patient was in whole, or in part, for the following medical condition, which is the primary reason for home health care:  NG tube feeding   I certify that, based on my findings, the following services are medically necessary home health services:  Nursing   Reason for Medically Necessary Home Health Services:  Skilled Nursing- Assessment  and Training for Infusion Therapy, Line Care, and Infection Control   My clinical findings support the need for the above services:  Unable to leave home safely without assistance and/or assistive device   Further, I certify that my clinical findings support that this patient is homebound due to:  Unable to leave home safely without assistance   Home Health   Complete by:  As directed    NG tube feeding management - Osmolite 50cc/hr GI Dr. Rush Landmark for sign off on orders.   To provide the following care/treatments:  RN     Allergies as of 05/19/2018   No Known Allergies     Medication List    TAKE these medications   amLODipine 10 MG tablet Commonly known as:  NORVASC Take 1 tablet (10 mg total) by mouth daily.   feeding supplement (OSMOLITE 1.5  CAL) Liqd Place 50 mL/hr into feeding tube daily.   metFORMIN 500 MG tablet Commonly known as:  GLUCOPHAGE Take 1 tablet (500 mg total) by mouth 2 (two) times daily with a meal.   metoprolol tartrate 25 MG tablet Commonly known as:  LOPRESSOR Take 1 tablet (25 mg total) by mouth 2 (two) times daily.   oxyCODONE-acetaminophen 5-325 MG tablet Commonly known as:  PERCOCET/ROXICET Take 1 tablet by mouth every 6 (six) hours as needed for up to 5 days for moderate pain.   simethicone 80 MG chewable tablet Commonly known as:  MYLICON Chew 1 tablet (80 mg total) by mouth every 6 (six) hours as needed for flatulence.            Durable Medical Equipment  (From admission, onward)         Start     Ordered   05/15/18 1458  For home use only DME Tube feeding pump  Once    Comments:  Continue Osmolite 1.5 @ 50 ml/hr via [employer].   05/15/18 1458         Follow-up Information    Home, [facility] Follow up.   Specialty:  Home Health Services Why:  Home Health RN- agency will call to arrange appointment Contact information: [place] [address] [phone]        [facility] Follow up.   Specialty:  Home Health Services Why:  will provide enteral feedings Contact information: [address] [place] [address] [phone]        Mansouraty, Telford Nab., MD. Go on 05/27/2018.   Specialties:  Gastroenterology, Internal Medicine Why:  9:00 AM Contact information: [place] [address] [phone]        [facility] Follow up.   Why:  Please call for appointment to establish a primary care doctor. Contact information: [address] [place]  [address] [phone]         Time coordinating discharge: 39 minutes  Signed:  Lennix Rotundo  [facility] 05/19/2018, 12:46 PM

## 2018-05-19 NOTE — Telephone Encounter (Signed)
The pt had a CT on 1/18 and is still admitted in the hospital.

## 2018-05-20 NOTE — Telephone Encounter (Signed)
Left message on machine to call back  

## 2018-05-21 ENCOUNTER — Other Ambulatory Visit: Payer: Self-pay | Admitting: Gastroenterology

## 2018-05-21 ENCOUNTER — Other Ambulatory Visit: Payer: Self-pay

## 2018-05-21 DIAGNOSIS — K311 Adult hypertrophic pyloric stenosis: Secondary | ICD-10-CM

## 2018-05-21 DIAGNOSIS — K8689 Other specified diseases of pancreas: Secondary | ICD-10-CM

## 2018-05-21 NOTE — Progress Notes (Signed)
You have been scheduled for a CT scan of the abdomen and pelvis at [facility].    You are scheduled on 05/26/18 at 1:15 pm. You should arrive 15 minutes prior to your appointment time for registration. Please follow the written instructions below on the day of your exam:  WARNING: IF YOU ARE ALLERGIC TO IODINE/X-RAY DYE, PLEASE NOTIFY RADIOLOGY IMMEDIATELY AT [phone]! YOU WILL BE GIVEN A 13 HOUR PREMEDICATION PREP.  1) Do not eat or drink anything after 915 am (4 hours prior to your test)  Please pick up contrast at [facility] radiology prior to that appt.

## 2018-05-21 NOTE — Telephone Encounter (Signed)
The patient has been notified of this information and all questions answered.

## 2018-05-21 NOTE — Telephone Encounter (Signed)
You have been scheduled for a CT scan of the abdomen and pelvis at [facility].    You are scheduled on 05/26/18 at 1:15 pm. You should arrive 15 minutes prior to your appointment time for registration. Please follow the written instructions below on the day of your exam:  WARNING: IF YOU ARE ALLERGIC TO IODINE/X-RAY DYE, PLEASE NOTIFY RADIOLOGY IMMEDIATELY AT [phone]! YOU WILL BE GIVEN A 13 HOUR PREMEDICATION PREP.  1) Do not eat or drink anything after 915 am (4 hours prior to your test)  Please pick up contrast at [facility] radiology prior to that appt.

## 2018-05-26 ENCOUNTER — Ambulatory Visit (HOSPITAL_COMMUNITY)
Admission: RE | Admit: 2018-05-26 | Discharge: 2018-05-26 | Disposition: A | Discharge status: Home / Self Care | Payer: Self-pay | Source: Ambulatory Visit | Attending: Gastroenterology | Admitting: Gastroenterology

## 2018-05-26 DIAGNOSIS — K8689 Other specified diseases of pancreas: Secondary | ICD-10-CM | POA: Insufficient documentation

## 2018-05-26 DIAGNOSIS — K311 Adult hypertrophic pyloric stenosis: Secondary | ICD-10-CM | POA: Insufficient documentation

## 2018-05-26 MED ORDER — IOHEXOL 300 MG/ML  SOLN
100.0000 mL | Freq: Once | INTRAMUSCULAR | Status: AC | PRN
  Administered 2018-05-26: 100 mL via INTRAVENOUS

## 2018-05-26 MED ORDER — SODIUM CHLORIDE (PF) 0.9 % IJ SOLN
INTRAMUSCULAR | Status: AC
  Filled 2018-05-26: qty 50

## 2018-05-27 ENCOUNTER — Ambulatory Visit (INDEPENDENT_AMBULATORY_CARE_PROVIDER_SITE_OTHER): Payer: Self-pay | Admitting: Nurse Practitioner

## 2018-05-27 ENCOUNTER — Encounter: Payer: Self-pay | Admitting: Nurse Practitioner

## 2018-05-27 VITALS — BP 136/80 | HR 70 | Ht 62.0 in | Wt 152.0 lb

## 2018-05-27 DIAGNOSIS — K859 Acute pancreatitis without necrosis or infection, unspecified: Secondary | ICD-10-CM

## 2018-05-27 DIAGNOSIS — K862 Cyst of pancreas: Secondary | ICD-10-CM

## 2018-05-27 DIAGNOSIS — K863 Pseudocyst of pancreas: Secondary | ICD-10-CM

## 2018-05-27 MED ORDER — HYDROCODONE-ACETAMINOPHEN 5-325 MG PO TABS: 1.0000 | ORAL_TABLET | Freq: Four times a day (QID) | ORAL | 0 refills | Status: DC | PRN

## 2018-05-27 MED ORDER — ONDANSETRON HCL 4 MG PO TABS: 4.0000 mg | ORAL_TABLET | Freq: Three times a day (TID) | ORAL | 2 refills | Status: DC | PRN

## 2018-05-27 NOTE — Progress Notes (Signed)
ASSESSMENT / PLAN:    58 year old female with recent prolonged hospitalization for acute on chronic pancreatitis complicated by a very large evolving pseudocyst creating mass effect on stomach resulting in GOO.  History of alcohol abuse but also query biliary source of the current episode of pancreatitis given cholelithiasis .  -small bowel feeding tube fell out but patient has been tolerating solids, says appetite is fine but would be better if she could get nausea under control.  She has no antiemetics at home.  Her weight is down 12 pounds since hospital discharge -follow up CT scan on 05/26/18 actually shows a slight reduction in size of evolving pseudocyst. Her abdominal pain is overall much better. At this point plan will be to repeat CTAP w contrast in ~ 4 weeks. She will then follow up with Dr. Rush Landmark a few days later to decide whether cystogastrostomy is needed.  -In the interim patient will call for any increase in abdominal pain or if nausea does not improve with a trial of Zofran which I will prescribe today.  -We will give her hydrocodone to keep on hand, she has no pain medication left at his point.  -Of note, imaging has repeatedly demonstrated a paraesophageal fluid collection, likely dissecting pseudocyst.  Not sure about the clinical significance of this, we will continue to monitor.  She is not having any problems with dysphagia -Eventual outpatient surgical evaluation for cholecystectomy (cholelithiasis on CTscan at [facility]).   HPI:    Chief Complaint:   Post hospital follow up for pancreatitis   Patient is a 58 year old female with a history of DM2, hypertension, Etoh abuse, and chronic pancreatitis. She was hospitalized at [facility] for several days in January for acute on chronic pancreatitis complicated by a very large fluid collection.  After discharge she developed nausea, vomiting and severe abdominal pain prompting visit to [facility] ED where she was  admitted and hospitalized for several days. While patient has a history of alcohol abuse, we were concerned that current episode of pancreatitis was biliary in origin  as cholelithiasis was seen on CT scan at [facility].  The large fluid collection was felt to be a pseudocyst , not mature enough for drainage. It.was causing a mass-effect on her stomach resulting in gastric outlet obstruction. Abdominal distention and discomfort improved with NGT decompression but her PO intake remained poor.   Dobhoff was placed, she was started on enteral feedings.  Was eventually able to tolerate liquids.  Patient was discharged home on enteral feedings, she is here for follow-up.  She has abstained from alcohol since discharge  Patient no longer has the small bowel feeding tube, it fell out.  She has actually been tolerating soft foods.  She does not have significant pain with eating but rather has pain in the evenings.  Pain is not near as severe as it was when she was hospitalized.  Her main concern is that of nausea.  Her weight is down 12 pounds since hospital discharge on 05/19/18 but she does feel like weight is starting to stabilize now.  She had a follow-up CT scan w contrast 05/26/18 and fluid collection slightly smaller measuring 8.2 x 7.1 cm.  Rim has become better organized and c/w pseudocyst  Data Reviewed:  05/26/18   CTAP w Contrast  IMPRESSION: 1. Continued evolution of the complex pancreatic and peripancreatic fluid collection. Collection measures minimally smaller today at 18.2  x 7.1 cm and demonstrates a better organized rim. Imaging features compatible with pseudocyst. Superinfection of this fluid collection can not be excluded by imaging. 2. Patency of the splenic vein can not be confirmed and is likely occluded in its mid segment. No other vascular complication evident. 3. Sequelae of chronic pancreatitis noted in the head and tail of pancreas. 4. Similar appearance of the distal paraesophageal  fluid collection, likely dissecting pseudocyst. 5. Stable 2 cm left adrenal nodule. Attention on follow-up recommended.   Past Medical History:  Diagnosis Date  . Diabetes mellitus without complication ([place])   . History of pancreatitis   . Hypertension     Past Surgical History:  Procedure Laterality Date  . CESAREAN SECTION     x2   Family History  Problem Relation Age of Onset  . CAD Mother   . Colon cancer Father    Social History   Tobacco Use  . Smoking status: Current Every Day Smoker  . Smokeless tobacco: Never Used  Substance Use Topics  . Alcohol use: Yes    Comment: occasional  . Drug use: Never   Current Outpatient Medications  Medication Sig Dispense Refill  . amLODipine (NORVASC) 10 MG tablet Take 1 tablet (10 mg total) by mouth daily. 30 tablet 2  . metFORMIN (GLUCOPHAGE) 500 MG tablet Take 1 tablet (500 mg total) by mouth 2 (two) times daily with a meal. 60 tablet 2  . metoprolol tartrate (LOPRESSOR) 25 MG tablet Take 1 tablet (25 mg total) by mouth 2 (two) times daily. 60 tablet 2  . simethicone (MYLICON) 80 MG chewable tablet Chew 1 tablet (80 mg total) by mouth every 6 (six) hours as needed for flatulence. 30 tablet 0   No current facility-administered medications for this visit.    No Known Allergies   Review of Systems: All systems reviewed and negative except where noted in HPI.   Serum creatinine: 0.67 mg/dL 05/18/18 0506 Estimated creatinine clearance: 68.9 mL/min   Physical Exam:    Wt Readings from Last 3 Encounters:  05/27/18 152 lb (68.9 kg)  05/19/18 164 lb 0.4 oz (74.4 kg)  04/29/18 172 lb (78 kg)    BP 136/80   Pulse 70   Ht 5\' 2"  (1.575 m)   Wt 152 lb (68.9 kg)   SpO2 98%   BMI 27.80 kg/m  Constitutional:  Pleasant female in no acute distress. Psychiatric: Normal mood and affect. Behavior is normal. EENT: Pupils normal.  Conjunctivae are normal. No scleral icterus. Neck supple.  Cardiovascular: Normal rate, regular  rhythm. No edema Pulmonary/chest: Effort normal and breath sounds normal. No wheezing, rales or rhonchi. Abdominal: Soft, nondistended, nontender. LUQ fullness, bowel sounds active throughout. Neurological: Alert and oriented to person place and time. Skin: Skin is warm and dry. No rashes noted.  Tye Savoy, NP  05/27/2018, 9:26 AM

## 2018-05-27 NOTE — Patient Instructions (Addendum)
If you are age 60 or older, your body mass index should be between 23-30. Your Body mass index is 27.8 kg/m. If this is out of the aforementioned range listed, please consider follow up with your Primary Care Provider.  If you are age 26 or younger, your body mass index should be between 19-25. Your Body mass index is 27.8 kg/m. If this is out of the aformentioned range listed, please consider follow up with your Primary Care Provider.   Your provider has requested that you go to the basement level for lab work on June 17, 2018. Press "B" on the elevator. The lab is located at the first door on the left as you exit the elevator. BMET - March 4  We have sent the following medications to your pharmacy for you to pick up at your convenience: [place]  Hydrocodone  You have been scheduled for a CT scan of the abdomen and pelvis at [place] are scheduled on March 12,2020 at 9 am. You should arrive 15 minutes prior to your appointment time for registration. Please follow the written instructions below on the day of your exam:  WARNING: IF YOU ARE ALLERGIC TO IODINE/X-RAY DYE, PLEASE NOTIFY RADIOLOGY IMMEDIATELY AT [phone]! YOU WILL BE GIVEN A 13 HOUR PREMEDICATION PREP.  1) Do not eat or drink anything after 5 am (4 hours prior to your test) 2) You have been given 2 bottles of oral contrast to drink. The solution may taste better if refrigerated, but do NOT add ice or any other liquid to this solution. Shake well before drinking.    Drink 1 bottle of contrast @ 7am (2 hours prior to your exam)  Drink 1 bottle of contrast @ 8am (1 hour prior to your exam)  You may take any medications as prescribed with a small amount of water, if necessary. If you take any of the following medications: METFORMIN, GLUCOPHAGE, GLUCOVANCE, AVANDAMET, RIOMET, FORTAMET, Teller MET, JANUMET, GLUMETZA or METAGLIP, you MAY be asked to HOLD this medication 48 hours AFTER  the exam.  The purpose of you drinking the oral contrast is to aid in the visualization of your intestinal tract. The contrast solution may cause some diarrhea. Depending on your individual set of symptoms, you may also receive an intravenous injection of x-ray contrast/dye. Plan on being at [facility] for 30 minutes or longer, depending on the type of exam you are having performed.  This test typically takes 30-45 minutes to complete.  If you have any questions regarding your exam or if you need to reschedule, you may call the CT department at [phone] between the hours of 8:00 am and 5:00 pm, Monday-Friday.  ______________________________________________________________ Follow up with Dr. Rush Landmark on June 30, 2018 at 9:30 am  Thank you for choosing me and [facility].   Tye Savoy, NP

## 2018-05-28 ENCOUNTER — Encounter: Payer: Self-pay | Admitting: Nurse Practitioner

## 2018-05-28 NOTE — Progress Notes (Signed)
Her weight is down 12 pounds actually though she says it is stabilizing. She is tolerating soft diet. Will ask her to supplement with Ensure TID. She needs to monitor weight at home and make sure it doesn't decline.

## 2018-05-28 NOTE — Progress Notes (Signed)
Agree with assessment and plan by PA [facility]. Review of the patient's imaging suggests a stable to slightly improved size in regards to the peripancreatic fluid collection/pseudocyst at this point in time.  It not clear to me that she has significant necrosis based on the imaging.  This is much more mature than it was previously and may be amenable to endoscopic drainage in the near future if necessary. However, since she is gaining weight and eating and able to tolerate without an enteral feeding tube if she can maintain this she may not require any endoscopic therapies. I would not start antibiotics for now unless there was concern of the development of infected pseudocyst but that does not seem to be the case in this patient's clinical scenario. 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 Agree with follow-up imaging in approximately 4 weeks and clinic visit thereafter. If patient in the interim develops weight loss or inability to tolerate oral intake or worsening pain we may need to consider endoscopic drainage via cyst gastrostomy.

## 2018-05-29 ENCOUNTER — Telehealth: Payer: Self-pay

## 2018-05-29 NOTE — Telephone Encounter (Signed)
-----   Message from Willia Craze, NP sent at 05/28/2018  7:11 PM EST ----- Beth, no rush but sometime tomorrow will you call this patient.  Her weight is down 12 pounds since hospital discharge.  I know she is tolerating soft food and that is good but it is very important that she does not continue to lose weight.  Please make sure she is weighing herself every day and not still losing.  Also suggest Ensure 3 times daily to supplement diet.  Thank you

## 2018-05-29 NOTE — Progress Notes (Signed)
I agree with the above note, plan 

## 2018-05-29 NOTE — Telephone Encounter (Signed)
Left a message to call back.

## 2018-06-03 ENCOUNTER — Telehealth: Payer: Self-pay | Admitting: Nurse Practitioner

## 2018-06-03 NOTE — Telephone Encounter (Signed)
Amanda Hahn we can give her 1 refill.  Please tell the patient that if she is still having enough pain to be using Norco on a regular basis then we may need to proceed with the procedure with Dr. Rush Landmark (to drain pseudocyst).  In clinic she implied to me that the pain was a whole lot better so we need to know if this is not the case.  Thanks

## 2018-06-03 NOTE — Telephone Encounter (Signed)
Pt requested new prescription for Norco sent to [facility].  Pt stated that she has one t remaining.

## 2018-06-04 ENCOUNTER — Telehealth: Payer: Self-pay

## 2018-06-04 ENCOUNTER — Other Ambulatory Visit: Payer: Self-pay | Admitting: Nurse Practitioner

## 2018-06-04 MED ORDER — HYDROCODONE-ACETAMINOPHEN 5-325 MG PO TABS: 1.0000 | ORAL_TABLET | Freq: Four times a day (QID) | ORAL | 0 refills | Status: DC | PRN

## 2018-06-04 NOTE — Telephone Encounter (Signed)
-----   Message from Irving Copas., MD sent at 06/04/2018  3:58 PM EST ----- Thanks Orphia Mctigue I forgot the last one was done during her hospitalization. It will be an EGD/EUS with AXIOS stenting. This will need to be for 90 minute slot.  I want to be able to talk with patient before the procedure about the implications of it. Thanks. GM ----- Message ----- From: Timothy Lasso, RN Sent: 06/04/2018   2:45 PM EST To: Willia Craze, NP, #  Dr Rush Landmark what procedure is this exactly?  I have not scheduled this before.  Thanks  ----- Message ----- From: Willia Craze, NP Sent: 06/04/2018   2:40 PM EST To: Timothy Lasso, RN, Irving Copas., MD  Helio Lack, would you please schedule this patient for a cystogastrostomy with Dr. Rush Landmark.  He says that he has time on the March 2 at [facility].  He asked that it be scheduled after his last case of the afternoon which is apparently finishing around 1:30 PM.  The patient knows that we will be calling to get this scheduled. Thanks [employer]

## 2018-06-04 NOTE — Telephone Encounter (Signed)
Called pharmacy to make certain they received prescription for Hydrocodone. Pharmacy stated patient has picked it up.

## 2018-06-04 NOTE — Progress Notes (Signed)
I sent in Hydrocodone Rx.

## 2018-06-04 NOTE — Telephone Encounter (Signed)
Amanda Hahn I spoke with patient this morning regarding refill request.  You will need to send the prescription for Norco. I no longer can do that.  Also, patient states she cannot afford Zofran.  She also states she would like to proceed with procedure. Thanks, Peter Congo

## 2018-06-04 NOTE — Telephone Encounter (Signed)
Pt called to follow up on this request. Pls call her.

## 2018-06-05 ENCOUNTER — Other Ambulatory Visit: Payer: Self-pay

## 2018-06-05 DIAGNOSIS — K863 Pseudocyst of pancreas: Principal | ICD-10-CM

## 2018-06-05 DIAGNOSIS — K862 Cyst of pancreas: Secondary | ICD-10-CM

## 2018-06-05 NOTE — Telephone Encounter (Signed)
EUS scheduled, pt instructed and medications reviewed.  Patient instructions mailed to home.  Patient to call with any questions or concerns.  

## 2018-06-05 NOTE — Telephone Encounter (Signed)
Left message on machine to call back  Instructions mailed to the pt home  

## 2018-06-11 ENCOUNTER — Other Ambulatory Visit: Payer: Self-pay

## 2018-06-11 ENCOUNTER — Encounter (HOSPITAL_COMMUNITY): Payer: Self-pay | Admitting: *Deleted

## 2018-06-11 NOTE — Progress Notes (Signed)
Amanda Hahn reports that she does not have a PCP at this time.  Patient has type II diabetes. Amanda Hahn reports that CBGs - fasting run 112-130. I instructed patient to check CBG after awaking and every 2 hours until arrival  to the hospital.  I Instructed patient if CBG is less than 70 to drink of a clear juice- like apple. Recheck CBG in 15 minutes then call pre- op desk at [phone] for further instructions.

## 2018-06-12 ENCOUNTER — Telehealth: Payer: Self-pay | Admitting: Gastroenterology

## 2018-06-12 NOTE — Telephone Encounter (Signed)
Patient called would like to know what they are going to do about the cyst she has in her stomach. If that is going to be taken care of when she comes for her procedure on 06-15-18 at [facility]

## 2018-06-12 NOTE — Telephone Encounter (Signed)
The pt was advised that Dr Rush Landmark would talk with her and explain everything that he was going to do at her appt.  The pt has been advised of the information and verbalized understanding.

## 2018-06-12 NOTE — Telephone Encounter (Signed)
Left message on machine to call back  

## 2018-06-15 ENCOUNTER — Encounter (HOSPITAL_COMMUNITY)
Admission: RE | Disposition: A | Discharge status: Home / Self Care | Payer: Self-pay | Source: Ambulatory Visit | Attending: Gastroenterology

## 2018-06-15 ENCOUNTER — Ambulatory Visit (HOSPITAL_COMMUNITY): Payer: Self-pay | Admitting: Anesthesiology

## 2018-06-15 ENCOUNTER — Encounter (HOSPITAL_COMMUNITY): Payer: Self-pay

## 2018-06-15 ENCOUNTER — Other Ambulatory Visit: Payer: Self-pay

## 2018-06-15 ENCOUNTER — Ambulatory Visit (HOSPITAL_COMMUNITY)
Admission: RE | Admit: 2018-06-15 | Discharge: 2018-06-15 | Disposition: A | Discharge status: Home / Self Care | Payer: Self-pay | Source: Ambulatory Visit | Attending: Gastroenterology | Admitting: Gastroenterology

## 2018-06-15 DIAGNOSIS — Z6826 Body mass index (BMI) 26.0-26.9, adult: Secondary | ICD-10-CM | POA: Insufficient documentation

## 2018-06-15 DIAGNOSIS — R634 Abnormal weight loss: Secondary | ICD-10-CM | POA: Insufficient documentation

## 2018-06-15 DIAGNOSIS — R109 Unspecified abdominal pain: Secondary | ICD-10-CM

## 2018-06-15 DIAGNOSIS — K863 Pseudocyst of pancreas: Secondary | ICD-10-CM | POA: Insufficient documentation

## 2018-06-15 DIAGNOSIS — K3189 Other diseases of stomach and duodenum: Secondary | ICD-10-CM | POA: Insufficient documentation

## 2018-06-15 DIAGNOSIS — K8020 Calculus of gallbladder without cholecystitis without obstruction: Secondary | ICD-10-CM | POA: Insufficient documentation

## 2018-06-15 DIAGNOSIS — D649 Anemia, unspecified: Secondary | ICD-10-CM | POA: Insufficient documentation

## 2018-06-15 DIAGNOSIS — K862 Cyst of pancreas: Secondary | ICD-10-CM

## 2018-06-15 DIAGNOSIS — K2970 Gastritis, unspecified, without bleeding: Secondary | ICD-10-CM

## 2018-06-15 DIAGNOSIS — R627 Adult failure to thrive: Secondary | ICD-10-CM | POA: Insufficient documentation

## 2018-06-15 DIAGNOSIS — K631 Perforation of intestine (nontraumatic): Secondary | ICD-10-CM

## 2018-06-15 DIAGNOSIS — I10 Essential (primary) hypertension: Secondary | ICD-10-CM | POA: Insufficient documentation

## 2018-06-15 DIAGNOSIS — K2210 Ulcer of esophagus without bleeding: Secondary | ICD-10-CM | POA: Insufficient documentation

## 2018-06-15 DIAGNOSIS — Z79899 Other long term (current) drug therapy: Secondary | ICD-10-CM | POA: Insufficient documentation

## 2018-06-15 DIAGNOSIS — Z87891 Personal history of nicotine dependence: Secondary | ICD-10-CM | POA: Insufficient documentation

## 2018-06-15 DIAGNOSIS — E119 Type 2 diabetes mellitus without complications: Secondary | ICD-10-CM | POA: Insufficient documentation

## 2018-06-15 DIAGNOSIS — K449 Diaphragmatic hernia without obstruction or gangrene: Secondary | ICD-10-CM | POA: Insufficient documentation

## 2018-06-15 HISTORY — PX: BALLOON DILATION: SHX5330

## 2018-06-15 HISTORY — DX: Anemia, unspecified: D64.9

## 2018-06-15 HISTORY — DX: Personal history of other medical treatment: Z92.89

## 2018-06-15 HISTORY — PX: BIOPSY: SHX5522

## 2018-06-15 HISTORY — PX: BILIARY STENT PLACEMENT: SHX5538

## 2018-06-15 HISTORY — PX: PANCREATIC STENT PLACEMENT: SHX5539

## 2018-06-15 HISTORY — PX: EUS: SHX5427

## 2018-06-15 HISTORY — PX: ESOPHAGOGASTRODUODENOSCOPY (EGD) WITH PROPOFOL: SHX5813

## 2018-06-15 SURGERY — ESOPHAGOGASTRODUODENOSCOPY (EGD) WITH PROPOFOL
Anesthesia: General

## 2018-06-15 MED ORDER — MIDAZOLAM HCL 5 MG/5ML IJ SOLN
INTRAMUSCULAR | Status: DC | PRN
  Administered 2018-06-15: 1 mg via INTRAVENOUS

## 2018-06-15 MED ORDER — SODIUM CHLORIDE (PF) 0.9 % IJ SOLN
PREFILLED_SYRINGE | INTRAMUSCULAR | Status: DC | PRN
  Administered 2018-06-15: 2 mL

## 2018-06-15 MED ORDER — LIDOCAINE 2% (20 MG/ML) 5 ML SYRINGE
INTRAMUSCULAR | Status: DC | PRN
  Administered 2018-06-15: 50 mg via INTRAVENOUS

## 2018-06-15 MED ORDER — PHENYLEPHRINE 40 MCG/ML (10ML) SYRINGE FOR IV PUSH (FOR BLOOD PRESSURE SUPPORT)
PREFILLED_SYRINGE | INTRAVENOUS | Status: DC | PRN
  Administered 2018-06-15 (×2): 80 ug via INTRAVENOUS

## 2018-06-15 MED ORDER — SUGAMMADEX SODIUM 200 MG/2ML IV SOLN
INTRAVENOUS | Status: DC | PRN
  Administered 2018-06-15: 200 mg via INTRAVENOUS

## 2018-06-15 MED ORDER — EPINEPHRINE PF 1 MG/10ML IJ SOSY
PREFILLED_SYRINGE | INTRAMUSCULAR | Status: AC
  Filled 2018-06-15: qty 10

## 2018-06-15 MED ORDER — ONDANSETRON HCL 4 MG/2ML IJ SOLN
INTRAMUSCULAR | Status: DC | PRN
  Administered 2018-06-15: 4 mg via INTRAVENOUS

## 2018-06-15 MED ORDER — PROPOFOL 10 MG/ML IV BOLUS
INTRAVENOUS | Status: DC | PRN
  Administered 2018-06-15: 110 mg via INTRAVENOUS

## 2018-06-15 MED ORDER — CIPROFLOXACIN HCL 500 MG PO TABS: 500.0000 mg | ORAL_TABLET | Freq: Two times a day (BID) | ORAL | 1 refills | Status: DC

## 2018-06-15 MED ORDER — LACTATED RINGERS IV SOLN
INTRAVENOUS | Status: DC
  Administered 2018-06-15: 15:00:00 via INTRAVENOUS
  Administered 2018-06-15: 1000 mL via INTRAVENOUS

## 2018-06-15 MED ORDER — CIPROFLOXACIN IN D5W 400 MG/200ML IV SOLN
INTRAVENOUS | Status: DC | PRN
  Administered 2018-06-15: 400 mg via INTRAVENOUS

## 2018-06-15 MED ORDER — DEXAMETHASONE SODIUM PHOSPHATE 10 MG/ML IJ SOLN
INTRAMUSCULAR | Status: DC | PRN
  Administered 2018-06-15: 5 mg via INTRAVENOUS

## 2018-06-15 MED ORDER — HYDROCODONE-ACETAMINOPHEN 5-325 MG PO TABS: 1.0000 | ORAL_TABLET | Freq: Four times a day (QID) | ORAL | 0 refills | Status: DC | PRN

## 2018-06-15 MED ORDER — FENTANYL CITRATE (PF) 100 MCG/2ML IJ SOLN: 25.0000 ug | Freq: Once | INTRAMUSCULAR | Status: DC

## 2018-06-15 MED ORDER — FENTANYL CITRATE (PF) 100 MCG/2ML IJ SOLN
25.0000 ug | Freq: Once | INTRAMUSCULAR | Status: AC
  Administered 2018-06-15: 25 ug via INTRAVENOUS

## 2018-06-15 MED ORDER — FENTANYL CITRATE (PF) 250 MCG/5ML IJ SOLN
INTRAMUSCULAR | Status: DC | PRN
  Administered 2018-06-15 (×2): 50 ug via INTRAVENOUS

## 2018-06-15 MED ORDER — CIPROFLOXACIN IN D5W 400 MG/200ML IV SOLN
INTRAVENOUS | Status: AC
  Filled 2018-06-15: qty 200

## 2018-06-15 MED ORDER — PROMETHAZINE HCL 25 MG/ML IJ SOLN: 6.2500 mg | INTRAMUSCULAR | Status: DC | PRN

## 2018-06-15 MED ORDER — SODIUM CHLORIDE 0.9 % IV SOLN: INTRAVENOUS | Status: DC

## 2018-06-15 MED ORDER — FENTANYL CITRATE (PF) 100 MCG/2ML IJ SOLN
INTRAMUSCULAR | Status: AC
  Filled 2018-06-15: qty 2

## 2018-06-15 MED ORDER — ROCURONIUM BROMIDE 10 MG/ML (PF) SYRINGE
PREFILLED_SYRINGE | INTRAVENOUS | Status: DC | PRN
  Administered 2018-06-15: 40 mg via INTRAVENOUS
  Administered 2018-06-15: 20 mg via INTRAVENOUS

## 2018-06-15 SURGICAL SUPPLY — 14 items

## 2018-06-15 NOTE — Op Note (Signed)
[facility] Patient Name: Amanda Hahn Procedure Date : 06/15/2018 MRN: [id number] Attending MD: Justice Britain , MD Date of Birth: 08-18-1960 CSN: [id number] Age: 58 Admit Type: Inpatient Procedure:                Upper EUS Indications:              Pancreatic cyst on CT scan, Abnormal                            abdominal/pelvic CT scan, Suspected chronic                            pancreatitis, Generalized abdominal pain, Weight                            loss, Failure to thrive, Early satiety Providers:                Justice Britain, MD, Carlyn Reichert, RN, William Dalton, Technician Referring MD:             Milus Banister, MD, Tye Savoy Medicines:                General Anesthesia, Cipro 951 mg IV Complications:            No immediate complications. Estimated Blood Loss:     Estimated blood loss was minimal. Procedure:                Pre-Anesthesia Assessment:                           - Prior to the procedure, a History and Physical                            was performed, and patient medications and                            allergies were reviewed. The patient's tolerance of                            previous anesthesia was also reviewed. The risks                            and benefits of the procedure and the sedation                            options and risks were discussed with the patient.                            All questions were answered, and informed consent                            was obtained. Prior Anticoagulants: The patient has  taken no previous anticoagulant or antiplatelet                            agents. ASA Grade Assessment: III - A patient with                            severe systemic disease. After reviewing the risks                            and benefits, the patient was deemed in                            satisfactory condition to undergo the  procedure.                           After obtaining informed consent, the endoscope was                            passed under direct vision. Throughout the                            procedure, the patient's blood pressure, pulse, and                            oxygen saturations were monitored continuously. The                            [id number]-H190 ([id number]) Olympus gastroscope was                            introduced through the mouth, and advanced to the                            second part of duodenum. The [id number]-[id number] ([id number])                            Olympus duodenoscope was introduced through the                            mouth, and advanced to the second part of duodenum.                            The [id number]-[id number] ([id number]) Olympus therapeutic                            gastroscope was introduced through the mouth, and                            advanced to the duodenum for ultrasound examination                            from the stomach and duodenum. After obtaining  informed consent, the endoscope was passed under                            direct vision. Throughout the procedure, the                            patient's blood pressure, pulse, and oxygen                            saturations were monitored continuously.The upper                            EUS was accomplished without difficulty. The                            patient tolerated the procedure. Scope In: Scope Out: Findings:      ENDOSCOPIC FINDING: :      No gross lesions were noted in the entire esophagus.      A small hiatal hernia was present.      Multiple dispersed, small non-bleeding erosions were found at the       incisura and in the gastric antrum. There were stigmata of recent       bleeding.      No other gross lesions were noted in the entire examined stomach.       Biopsies were taken with a cold forceps for histology and Helicobacter       pylori testing.      No  gross lesions were noted in the duodenal bulb, in the first portion       of the duodenum and in the second portion of the duodenum.      The ampulla was normal.      ENDOSONOGRAPHIC FINDING: :      A heterogenous lesion suggestive of a pseudocyst/walled-off necrosis was       identified in the pancreatic body. The lesion measured at least 108 mm       by 43 mm in maximal cross-sectional diameter (though more recent CT had       suggested lesion to be larger). There were many compartments. The outer       wall of the lesion was thick. There was no associated mass that could be       visualized. There was significant echogenic and heterogenous internal       debris within the fluid-filled cavity. The decision was made to create a       cystogastrostomy using the AXIOS stent system. Once an appropriate       position in the stomach was identified, the common wall between the       stomach and the cyst was interrogated utilizing color Doppler imaging to       identify interposed vessels. The stomach wall and the cyst were       punctured under endosonographic guidance via the AXIOS stent and       electrocautery device through the working channel. The AXIOS device was       advanced into the cyst, and a 15 x 10 mm AXIOS stent was placed with the       flanges in close approximation to the walls of the cyst and the stomach  through the cystogastrostomy. The stent was successfully placed. A TTS       dilator was passed through the endoscope. Dilation with a 12-13.5-15 mm       pyloric balloon dilator was performed up to 15 mm. Area was successfully       injected with 3 mL of a 1:10,000 solution of epinephrine for hemostasis.       The cyst was partially filled with fluid and necrotic tissue that was       pasty and adherent to the cyst wall. A 5 cm 10 Fr [employer]       double pigtail stent was placed into the pseudocyst through the       cystogastrostomy using a stent introducer  set. The stent was       successfully placed. This is to help minimize necroma causing       obstruction of the AXIOS stent.      An anechoic lesion suggestive of a cyst was identified in the       paraesophageal region. The lesion measured 67 mm by 17 mm in maximal       cross-sectional diameter. There was a single compartment without septae.       The outer wall of the lesion was thin. There was no associated mass.       There was no internal debris within the fluid-filled cavity. Not clear       if this is an extension of the noted pseudocyst through the hiatal       hernia into the esopahgus or potentially a duplication cyst (though on       initial CT scan before her more significant fluid collection developed       this had been noted).      Multiple stones were visualized endosonographically in the gallbladder.       The stones were round. They were hyperechoic and characterized by       shadowing.      Endosonographic imaging in the visualized portion of the liver showed no       mass-lesion      The celiac region was visualized. Impression:               EGD Impression:                           - No gross lesions in esophagus.                           - Small hiatal hernia.                           - Non-bleeding erosive gastropathy. Biopsied for HP.                           - No gross lesions in the duodenal bulb, in the                            first portion of the duodenum and in the second                            portion of the duodenum.                           -  Normal ampulla.                           EUS Impression:                           - A 100 mm by 43 mm pseudocyst was seen in the                            pancreatic body. The diagnosis is a pancreatic                            walled-off necrosis. Hot AXIOS system used to                            create cystgastrostomy. Dilated AXIOS tract.                            Epinephrine placed to aid  with hemostasis around                            the AXIOS stent. Double-pigtail placed into the                            cyst cavity, through the AXIOS to decrease risk of                            necroma collapse within stent.                           - A cystic lesion was seen in the paraesophageal                            region. The endosonographic appearance is that of a                            peripancreatic fluid collection that has dissected                            through a hiatal hernia vs a possible duplication                            cyst.                           - Multiple stones were visualized                            endosonographically in the gallbladder. Recommendation:           - The patient will be observed post-procedure,                            until all discharge criteria are met.                           -  Will evaluate patient to see if able to be                            discharged home vs Inpatient Observation.                           - Patient has a contact number available for                            emergencies. The signs and symptoms of potential                            delayed complications were discussed with the                            patient. Return to normal activities tomorrow.                            Written discharge instructions were provided to the                            patient.                           - Low fat diet to be followed.                           - Observe patient's clinical course.                           - Cipro (ciprofloxacin) 500 mg PO BID for 1 month.                           - Will discuss with patient and God-Daughter timing                            of next procedure which will be a Necrosectomy.                            Ideally within the next 1-week. We are looking                            towards 3/4 AM if possible if not will have to                             re-arrange 3/9 schedule to be able to accomodate.                           - If patient develops issues at home with                            fevers/chills/progressive abdominal  pain/melena/hematemesis/coffee-ground emesis, she                            knows to call 911 and be taken to the nearest ED                            for further evaluation.                           - The findings and recommendations were discussed                            with the patient.                           - The findings and recommendations were discussed                            with the patient's family. Procedure Code(s):        --- Professional ---                           [id number], Esophagogastroduodenoscopy, flexible,                            transoral; with transmural drainage of pseudocyst                            (includes placement of transmural drainage                            catheter[s]/stent[s], when performed, and                            endoscopic ultrasound, when performed)                           43237, Esophagogastroduodenoscopy, flexible,                            transoral; with endoscopic ultrasound examination                            limited to the esophagus, stomach or duodenum, and                            adjacent structures                           43245, Esophagogastroduodenoscopy, flexible,                            transoral; with dilation of gastric/duodenal                            stricture(s) (eg, balloon, bougie)  48999, Unlisted procedure, pancreas Diagnosis Code(s):        --- Professional ---                           K44.9, Diaphragmatic hernia without obstruction or                            gangrene                           K31.89, Other diseases of stomach and duodenum                           K86.3, Pseudocyst of pancreas                           K86.2, Cyst of pancreas                            K80.20, Calculus of gallbladder without                            cholecystitis without obstruction                           R10.84, Generalized abdominal pain                           R63.4, Abnormal weight loss                           R62.7, Adult failure to thrive                           R68.81, Early satiety                           R93.5, Abnormal findings on diagnostic imaging of                            other abdominal regions, including retroperitoneum CPT copyright 2018 American Medical Association. All rights reserved. The codes documented in this report are preliminary and upon coder review may  be revised to meet current compliance requirements. Justice Britain, MD 06/15/2018 3:29:17 PM Number of Addenda: 0

## 2018-06-15 NOTE — H&P (Signed)
GASTROENTEROLOGY OUTPATIENT PROCEDURE H&P NOTE   Primary Care Physician: Patient, No Pcp Per  HPI: Amanda Hahn is a 58 y.o. female who presents for EGD/EUS with pancreatic cyst gastrostomy. The patient presented back in January with acute pancreatitis.  The etiology of her pancreatitis was not completely clear however she had evidence on CT imaging of potential chronic pancreatitis and has a prior history of alcohol use.  There had been concern for the possibility of biliary pancreatitis during her admission in January at [facility] and then subsequently at [facility].  The patient followed up in mid February with PA Chester Holstein and was felt to be doing stably well.  She was having abdominal discomfort as well as nausea and she was given antiemetics as well as pain medications.  The CT scan had shown progressive changes of her peripancreatic fluid collection to a pseudocyst with possible small amount of debris.  There was also concern for a potential dissecting paraesophageal fluid collection potentially a result of a hiatal hernia.  The patient had further issues of worsening abdominal pain as well as early satiety and is experiencing issues with weight loss.  As such decision was made to bring the patient to discuss a cyst gastrostomy attempt.  Past Medical History:  Diagnosis Date  . Anemia   . Diabetes mellitus without complication ([place])    Type II  . History of blood transfusion 2011  . History of pancreatitis   . Hypertension    Past Surgical History:  Procedure Laterality Date  . CESAREAN SECTION     x2  . ESOPHAGOGASTRODUODENOSCOPY     x2   Current Facility-Administered Medications  Medication Dose Route Frequency Provider Last Rate Last Dose  . 0.9 %  sodium chloride infusion   Intravenous Continuous Mansouraty, Telford Nab., MD      . lactated ringers infusion   Intravenous Continuous Brennan Bailey, MD 50 mL/hr at 06/15/18 1214 1,000 mL at 06/15/18 1214  . promethazine  (PHENERGAN) injection 6.25-12.5 mg  6.25-12.5 mg Intravenous Q15 min PRN Brennan Bailey, MD       No Known Allergies Family History  Problem Relation Age of Onset  . CAD Mother   . Colon cancer Father    Social History   Socioeconomic History  . Marital status: Single    Spouse name: Not on file  . Number of children: Not on file  . Years of education: Not on file  . Highest education level: Not on file  Occupational History  . Not on file  Social Needs  . Financial resource strain: Not on file  . Food insecurity:    Worry: Not on file    Inability: Not on file  . Transportation needs:    Medical: Not on file    Non-medical: Not on file  Tobacco Use  . Smoking status: Former Smoker    Years: 40.00    Last attempt to quit: 05/24/2018    Years since quitting: 0.0  . Smokeless tobacco: Never Used  . Tobacco comment: "light years- maybe 2 a day."  Substance and Sexual Activity  . Alcohol use: Not Currently    Comment: occasional  . Drug use: Never  . Sexual activity: Not Currently  Lifestyle  . Physical activity:    Days per week: Not on file    Minutes per session: Not on file  . Stress: Not on file  Relationships  . Social connections:    Talks on phone: Not on file  Gets together: Not on file    Attends religious service: Not on file    Active member of club or organization: Not on file    Attends meetings of clubs or organizations: Not on file    Relationship status: Not on file  . Intimate partner violence:    Fear of current or ex partner: Not on file    Emotionally abused: Not on file    Physically abused: Not on file    Forced sexual activity: Not on file  Other Topics Concern  . Not on file  Social History Narrative  . Not on file    Physical Exam: Vital signs in last 24 hours: Temp:  [98.2 F (36.8 C)] 98.2 F (36.8 C) (03/02 1159) Resp:  [22] 22 (03/02 1159) BP: (138-143)/(93-95) 138/95 (03/02 1210) SpO2:  [100 %] 100 % (03/02  1159) Weight:  [66.2 kg] 66.2 kg (03/02 1159)   GEN: NAD EYE: Sclerae anicteric ENT: MMM CV: RR without R/Gs  RESP: CTAB posteriorly GI: Soft, tenderness to palpation in the midepigastrium and left upper quadrant region (4-5 out of 10) NEURO:  Alert & Oriented x 3  Lab Results: No results for input(s): WBC, HGB, HCT, PLT in the last 72 hours. BMET No results for input(s): NA, K, CL, CO2, GLUCOSE, BUN, CREATININE, CALCIUM in the last 72 hours. LFT No results for input(s): PROT, ALBUMIN, AST, ALT, ALKPHOS, BILITOT, BILIDIR, IBILI in the last 72 hours. PT/INR No results for input(s): LABPROT, INR in the last 72 hours.   Impression / Plan: This is a 58 y.o.female who presents for EGD/EUS with possible cyst gastrostomy placement.  After speaking with the patient for more than 15 minutes prior to her procedure as well as her goddaughter I do think that the patient is symptomatic from her cyst.  I think a cyst gastrostomy attempt is worthwhile.  The more recent imaging from 2-1/2 weeks ago suggested that there was further maturation of the cyst and it is now been almost 2 months since the initial bout of her pancreatitis.  We will proceed with an upper endoscopy and then potential attempt at cyst gastrostomy.  The risks of EUS including bleeding, infection, aspiration pneumonia and intestinal perforation were discussed as was the possibility it may not give a definitive diagnosis.  If a biopsy of the pancreas is done as part of the EUS, there is an additional risk of pancreatitis at the rate of about 1%.  It was explained that procedure related pancreatitis is typically mild, although can be severe and even life threatening, which is why we do not perform random pancreatic biopsies and only biopsy a lesion we feel is concerning enough to warrant the risk.  I told the patient and her goddaughter that there are increased risks of bleeding that occur with this procedure but often that is temporized  by the placement of the stent.  Slight increased risk of perforation however hopefully the axial slams will allow adequate drainage and apposition of the cyst wall and gastric wall.  The patient is aware that if we drain this she will need to be initiated on antibiotics and based on the findings of the inside of the pancreatic cyst will decide whether the patient will need necrosectomy or if she can be monitored with imaging in the follow-up period.  The risks and benefits of endoscopic evaluation were discussed with the patient; these include but are not limited to the risk of perforation, infection, bleeding, missed lesions, lack  of diagnosis, severe illness requiring hospitalization, as well as anesthesia and sedation related illnesses.  The patient and goddaughter are agreeable to proceed.    Justice Britain, MD [facility] Office # [phone]

## 2018-06-15 NOTE — Transfer of Care (Signed)
Immediate Anesthesia Transfer of Care Note  Patient: Amanda Hahn  Procedure(s) Performed: ESOPHAGOGASTRODUODENOSCOPY (EGD) WITH PROPOFOL (N/A ) UPPER ENDOSCOPIC ULTRASOUND (EUS) RADIAL (N/A ) BIOPSY PANCREATIC STENT PLACEMENT BILIARY STENT PLACEMENT BALLOON DILATION (N/A )  Patient Location: Endoscopy Unit  Anesthesia Type:General  Level of Consciousness: awake, alert  and oriented  Airway & Oxygen Therapy: Patient Spontanous Breathing and Patient connected to nasal cannula oxygen  Post-op Assessment: Report given to RN, Post -op Vital signs reviewed and stable and Patient moving all extremities X 4  Post vital signs: Reviewed and stable  Last Vitals:  Vitals Value Taken Time  BP    Temp    Pulse 98 06/15/2018  2:57 PM  Resp 25 06/15/2018  2:57 PM  SpO2 98 % 06/15/2018  2:57 PM  Vitals shown include unvalidated device data.  Last Pain:  Vitals:   06/15/18 1159  TempSrc: Oral  PainSc: 9          Complications: No apparent anesthesia complications

## 2018-06-15 NOTE — Anesthesia Preprocedure Evaluation (Addendum)
Anesthesia Evaluation  Patient identified by MRN, date of birth, ID band Patient awake    Reviewed: Allergy & Precautions, NPO status , Patient's Chart, lab work & pertinent test results  History of Anesthesia Complications Negative for: history of anesthetic complications  Airway Mallampati: II  TM Distance: >3 FB Neck ROM: Full    Dental  (+) Dental Advisory Given, Poor Dentition, Missing,    Pulmonary former smoker,    Pulmonary exam normal breath sounds clear to auscultation       Cardiovascular hypertension, Pt. on medications and Pt. on home beta blockers Normal cardiovascular exam Rhythm:Regular Rate:Normal     Neuro/Psych negative neurological ROS     GI/Hepatic negative GI ROS, Neg liver ROS,   Endo/Other  diabetes, Type 2, Oral Hypoglycemic Agents  Renal/GU negative Renal ROS     Musculoskeletal negative musculoskeletal ROS (+)   Abdominal   Peds  Hematology  (+) anemia ,   Anesthesia Other Findings Day of surgery medications reviewed with the patient.  Reproductive/Obstetrics                            Anesthesia Physical Anesthesia Plan  ASA: II  Anesthesia Plan: General   Post-op Pain Management:    Induction: Intravenous  PONV Risk Score and Plan: 3 and Treatment may vary due to age or medical condition, Propofol infusion and Ondansetron  Airway Management Planned: Oral ETT  Additional Equipment:   Intra-op Plan:   Post-operative Plan: Extubation in OR  Informed Consent: I have reviewed the patients History and Physical, chart, labs and discussed the procedure including the risks, benefits and alternatives for the proposed anesthesia with the patient or authorized representative who has indicated his/her understanding and acceptance.     Dental advisory given  Plan Discussed with: CRNA  Anesthesia Plan Comments:       Anesthesia Quick Evaluation

## 2018-06-15 NOTE — Anesthesia Postprocedure Evaluation (Signed)
Anesthesia Post Note  Patient: Amanda Hahn  Procedure(s) Performed: ESOPHAGOGASTRODUODENOSCOPY (EGD) WITH PROPOFOL (N/A ) UPPER ENDOSCOPIC ULTRASOUND (EUS) RADIAL (N/A ) BIOPSY PANCREATIC STENT PLACEMENT BILIARY STENT PLACEMENT BALLOON DILATION (N/A ) HEMOSTASIS CONTROL     Patient location during evaluation: Endoscopy Anesthesia Type: General Level of consciousness: awake and alert Pain management: pain level controlled Vital Signs Assessment: post-procedure vital signs reviewed and stable Respiratory status: spontaneous breathing, nonlabored ventilation and respiratory function stable Cardiovascular status: blood pressure returned to baseline and stable Postop Assessment: no apparent nausea or vomiting Anesthetic complications: no    Last Vitals:  Vitals:   06/15/18 1534 06/15/18 1601  BP: (!) 153/92 (!) 145/87  Pulse: 93 91  Resp: 19 18  Temp:    SpO2: 97% 98%    Last Pain:  Vitals:   06/15/18 1559  TempSrc:   PainSc: 0-No pain                 Suri Tafolla,W. EDMOND

## 2018-06-15 NOTE — H&P (View-Only) (Signed)
GASTROENTEROLOGY OUTPATIENT PROCEDURE H&P NOTE   Primary Care Physician: Patient, No Pcp Per  HPI: Amanda Hahn is a 58 y.o. female who presents for EGD/EUS with pancreatic cyst gastrostomy. The patient presented back in January with acute pancreatitis.  The etiology of her pancreatitis was not completely clear however she had evidence on CT imaging of potential chronic pancreatitis and has a prior history of alcohol use.  There had been concern for the possibility of biliary pancreatitis during her admission in January at [facility] and then subsequently at [facility].  The patient followed up in mid February with PA Chester Holstein and was felt to be doing stably well.  She was having abdominal discomfort as well as nausea and she was given antiemetics as well as pain medications.  The CT scan had shown progressive changes of her peripancreatic fluid collection to a pseudocyst with possible small amount of debris.  There was also concern for a potential dissecting paraesophageal fluid collection potentially a result of a hiatal hernia.  The patient had further issues of worsening abdominal pain as well as early satiety and is experiencing issues with weight loss.  As such decision was made to bring the patient to discuss a cyst gastrostomy attempt.  Past Medical History:  Diagnosis Date  . Anemia   . Diabetes mellitus without complication ([place])    Type II  . History of blood transfusion 2011  . History of pancreatitis   . Hypertension    Past Surgical History:  Procedure Laterality Date  . CESAREAN SECTION     x2  . ESOPHAGOGASTRODUODENOSCOPY     x2   Current Facility-Administered Medications  Medication Dose Route Frequency Provider Last Rate Last Dose  . 0.9 %  sodium chloride infusion   Intravenous Continuous Mansouraty, Telford Nab., MD      . lactated ringers infusion   Intravenous Continuous Brennan Bailey, MD 50 mL/hr at 06/15/18 1214 1,000 mL at 06/15/18 1214  . promethazine  (PHENERGAN) injection 6.25-12.5 mg  6.25-12.5 mg Intravenous Q15 min PRN Brennan Bailey, MD       No Known Allergies Family History  Problem Relation Age of Onset  . CAD Mother   . Colon cancer Father    Social History   Socioeconomic History  . Marital status: Single    Spouse name: Not on file  . Number of children: Not on file  . Years of education: Not on file  . Highest education level: Not on file  Occupational History  . Not on file  Social Needs  . Financial resource strain: Not on file  . Food insecurity:    Worry: Not on file    Inability: Not on file  . Transportation needs:    Medical: Not on file    Non-medical: Not on file  Tobacco Use  . Smoking status: Former Smoker    Years: 40.00    Last attempt to quit: 05/24/2018    Years since quitting: 0.0  . Smokeless tobacco: Never Used  . Tobacco comment: "light years- maybe 2 a day."  Substance and Sexual Activity  . Alcohol use: Not Currently    Comment: occasional  . Drug use: Never  . Sexual activity: Not Currently  Lifestyle  . Physical activity:    Days per week: Not on file    Minutes per session: Not on file  . Stress: Not on file  Relationships  . Social connections:    Talks on phone: Not on file  Gets together: Not on file    Attends religious service: Not on file    Active member of club or organization: Not on file    Attends meetings of clubs or organizations: Not on file    Relationship status: Not on file  . Intimate partner violence:    Fear of current or ex partner: Not on file    Emotionally abused: Not on file    Physically abused: Not on file    Forced sexual activity: Not on file  Other Topics Concern  . Not on file  Social History Narrative  . Not on file    Physical Exam: Vital signs in last 24 hours: Temp:  [98.2 F (36.8 C)] 98.2 F (36.8 C) (03/02 1159) Resp:  [22] 22 (03/02 1159) BP: (138-143)/(93-95) 138/95 (03/02 1210) SpO2:  [100 %] 100 % (03/02  1159) Weight:  [66.2 kg] 66.2 kg (03/02 1159)   GEN: NAD EYE: Sclerae anicteric ENT: MMM CV: RR without R/Gs  RESP: CTAB posteriorly GI: Soft, tenderness to palpation in the midepigastrium and left upper quadrant region (4-5 out of 10) NEURO:  Alert & Oriented x 3  Lab Results: No results for input(s): WBC, HGB, HCT, PLT in the last 72 hours. BMET No results for input(s): NA, K, CL, CO2, GLUCOSE, BUN, CREATININE, CALCIUM in the last 72 hours. LFT No results for input(s): PROT, ALBUMIN, AST, ALT, ALKPHOS, BILITOT, BILIDIR, IBILI in the last 72 hours. PT/INR No results for input(s): LABPROT, INR in the last 72 hours.   Impression / Plan: This is a 58 y.o.female who presents for EGD/EUS with possible cyst gastrostomy placement.  After speaking with the patient for more than 15 minutes prior to her procedure as well as her goddaughter I do think that the patient is symptomatic from her cyst.  I think a cyst gastrostomy attempt is worthwhile.  The more recent imaging from 2-1/2 weeks ago suggested that there was further maturation of the cyst and it is now been almost 2 months since the initial bout of her pancreatitis.  We will proceed with an upper endoscopy and then potential attempt at cyst gastrostomy.  The risks of EUS including bleeding, infection, aspiration pneumonia and intestinal perforation were discussed as was the possibility it may not give a definitive diagnosis.  If a biopsy of the pancreas is done as part of the EUS, there is an additional risk of pancreatitis at the rate of about 1%.  It was explained that procedure related pancreatitis is typically mild, although can be severe and even life threatening, which is why we do not perform random pancreatic biopsies and only biopsy a lesion we feel is concerning enough to warrant the risk.  I told the patient and her goddaughter that there are increased risks of bleeding that occur with this procedure but often that is temporized  by the placement of the stent.  Slight increased risk of perforation however hopefully the axial slams will allow adequate drainage and apposition of the cyst wall and gastric wall.  The patient is aware that if we drain this she will need to be initiated on antibiotics and based on the findings of the inside of the pancreatic cyst will decide whether the patient will need necrosectomy or if she can be monitored with imaging in the follow-up period.  The risks and benefits of endoscopic evaluation were discussed with the patient; these include but are not limited to the risk of perforation, infection, bleeding, missed lesions, lack  of diagnosis, severe illness requiring hospitalization, as well as anesthesia and sedation related illnesses.  The patient and goddaughter are agreeable to proceed.    Justice Britain, MD [facility] Office # [phone]

## 2018-06-15 NOTE — Discharge Instructions (Signed)
Upper Endoscopy, Adult, Care After °This sheet gives you information about how to care for yourself after your procedure. Your health care provider may also give you more specific instructions. If you have problems or questions, contact your health care provider. °What can I expect after the procedure? °After the procedure, it is common to have: °· A sore throat. °· Mild stomach pain or discomfort. °· Bloating. °· Nausea. °Follow these instructions at home: ° °· Follow instructions from your health care provider about what to eat or drink after your procedure. °· Return to your normal activities as told by your health care provider. Ask your health care provider what activities are safe for you. °· Take over-the-counter and prescription medicines only as told by your health care provider. °· Do not drive for 24 hours if you were given a sedative during your procedure. °· Keep all follow-up visits as told by your health care provider. This is important. °Contact a health care provider if you have: °· A sore throat that lasts longer than one day. °· Trouble swallowing. °Get help right away if: °· You vomit blood or your vomit looks like coffee grounds. °· You have: °? A fever. °? Bloody, black, or tarry stools. °? A severe sore throat or you cannot swallow. °? Difficulty breathing. °? Severe pain in your chest or abdomen. °Summary °· After the procedure, it is common to have a sore throat, mild stomach discomfort, bloating, and nausea. °· Do not drive for 24 hours if you were given a sedative during the procedure. °· Follow instructions from your health care provider about what to eat or drink after your procedure. °· Return to your normal activities as told by your health care provider. °This information is not intended to replace advice given to you by your health care provider. Make sure you discuss any questions you have with your health care provider. °Document Released: 10/01/2011 Document Revised: 09/01/2017  Document Reviewed: 09/01/2017 °Elsevier Interactive Patient Education © 2019 [employer] ° °

## 2018-06-15 NOTE — Anesthesia Procedure Notes (Signed)
Procedure Name: Intubation Date/Time: 06/15/2018 1:11 PM Performed by: Mariea Clonts, CRNA Pre-anesthesia Checklist: Patient identified, Emergency Drugs available, Suction available and Patient being monitored Patient Re-evaluated:Patient Re-evaluated prior to induction Oxygen Delivery Method: Circle System Utilized Preoxygenation: Pre-oxygenation with 100% oxygen Induction Type: IV induction Ventilation: Mask ventilation without difficulty Laryngoscope Size: Miller and 2 Grade View: Grade I Tube type: Oral Tube size: 6.5 mm Number of attempts: 1 Airway Equipment and Method: Stylet and Oral airway Placement Confirmation: ETT inserted through vocal cords under direct vision,  positive ETCO2 and breath sounds checked- equal and bilateral Tube secured with: Tape Dental Injury: Teeth and Oropharynx as per pre-operative assessment

## 2018-06-15 NOTE — H&P (View-Only) (Signed)
GASTROENTEROLOGY OUTPATIENT PROCEDURE H&P NOTE   Primary Care Physician: Patient, No Pcp Per  HPI: Amanda Hahn is a 58 y.o. female who presents for EGD/EUS with pancreatic cyst gastrostomy. The patient presented back in January with acute pancreatitis.  The etiology of her pancreatitis was not completely clear however she had evidence on CT imaging of potential chronic pancreatitis and has a prior history of alcohol use.  There had been concern for the possibility of biliary pancreatitis during her admission in January at [facility] and then subsequently at [facility].  The patient followed up in mid February with PA Chester Holstein and was felt to be doing stably well.  She was having abdominal discomfort as well as nausea and she was given antiemetics as well as pain medications.  The CT scan had shown progressive changes of her peripancreatic fluid collection to a pseudocyst with possible small amount of debris.  There was also concern for a potential dissecting paraesophageal fluid collection potentially a result of a hiatal hernia.  The patient had further issues of worsening abdominal pain as well as early satiety and is experiencing issues with weight loss.  As such decision was made to bring the patient to discuss a cyst gastrostomy attempt.  Past Medical History:  Diagnosis Date  . Anemia   . Diabetes mellitus without complication ([place])    Type II  . History of blood transfusion 2011  . History of pancreatitis   . Hypertension    Past Surgical History:  Procedure Laterality Date  . CESAREAN SECTION     x2  . ESOPHAGOGASTRODUODENOSCOPY     x2   Current Facility-Administered Medications  Medication Dose Route Frequency Provider Last Rate Last Dose  . 0.9 %  sodium chloride infusion   Intravenous Continuous Mansouraty, Telford Nab., MD      . lactated ringers infusion   Intravenous Continuous Brennan Bailey, MD 50 mL/hr at 06/15/18 1214 1,000 mL at 06/15/18 1214  . promethazine  (PHENERGAN) injection 6.25-12.5 mg  6.25-12.5 mg Intravenous Q15 min PRN Brennan Bailey, MD       No Known Allergies Family History  Problem Relation Age of Onset  . CAD Mother   . Colon cancer Father    Social History   Socioeconomic History  . Marital status: Single    Spouse name: Not on file  . Number of children: Not on file  . Years of education: Not on file  . Highest education level: Not on file  Occupational History  . Not on file  Social Needs  . Financial resource strain: Not on file  . Food insecurity:    Worry: Not on file    Inability: Not on file  . Transportation needs:    Medical: Not on file    Non-medical: Not on file  Tobacco Use  . Smoking status: Former Smoker    Years: 40.00    Last attempt to quit: 05/24/2018    Years since quitting: 0.0  . Smokeless tobacco: Never Used  . Tobacco comment: "light years- maybe 2 a day."  Substance and Sexual Activity  . Alcohol use: Not Currently    Comment: occasional  . Drug use: Never  . Sexual activity: Not Currently  Lifestyle  . Physical activity:    Days per week: Not on file    Minutes per session: Not on file  . Stress: Not on file  Relationships  . Social connections:    Talks on phone: Not on file  Gets together: Not on file    Attends religious service: Not on file    Active member of club or organization: Not on file    Attends meetings of clubs or organizations: Not on file    Relationship status: Not on file  . Intimate partner violence:    Fear of current or ex partner: Not on file    Emotionally abused: Not on file    Physically abused: Not on file    Forced sexual activity: Not on file  Other Topics Concern  . Not on file  Social History Narrative  . Not on file    Physical Exam: Vital signs in last 24 hours: Temp:  [98.2 F (36.8 C)] 98.2 F (36.8 C) (03/02 1159) Resp:  [22] 22 (03/02 1159) BP: (138-143)/(93-95) 138/95 (03/02 1210) SpO2:  [100 %] 100 % (03/02  1159) Weight:  [66.2 kg] 66.2 kg (03/02 1159)   GEN: NAD EYE: Sclerae anicteric ENT: MMM CV: RR without R/Gs  RESP: CTAB posteriorly GI: Soft, tenderness to palpation in the midepigastrium and left upper quadrant region (4-5 out of 10) NEURO:  Alert & Oriented x 3  Lab Results: No results for input(s): WBC, HGB, HCT, PLT in the last 72 hours. BMET No results for input(s): NA, K, CL, CO2, GLUCOSE, BUN, CREATININE, CALCIUM in the last 72 hours. LFT No results for input(s): PROT, ALBUMIN, AST, ALT, ALKPHOS, BILITOT, BILIDIR, IBILI in the last 72 hours. PT/INR No results for input(s): LABPROT, INR in the last 72 hours.   Impression / Plan: This is a 58 y.o.female who presents for EGD/EUS with possible cyst gastrostomy placement.  After speaking with the patient for more than 15 minutes prior to her procedure as well as her goddaughter I do think that the patient is symptomatic from her cyst.  I think a cyst gastrostomy attempt is worthwhile.  The more recent imaging from 2-1/2 weeks ago suggested that there was further maturation of the cyst and it is now been almost 2 months since the initial bout of her pancreatitis.  We will proceed with an upper endoscopy and then potential attempt at cyst gastrostomy.  The risks of EUS including bleeding, infection, aspiration pneumonia and intestinal perforation were discussed as was the possibility it may not give a definitive diagnosis.  If a biopsy of the pancreas is done as part of the EUS, there is an additional risk of pancreatitis at the rate of about 1%.  It was explained that procedure related pancreatitis is typically mild, although can be severe and even life threatening, which is why we do not perform random pancreatic biopsies and only biopsy a lesion we feel is concerning enough to warrant the risk.  I told the patient and her goddaughter that there are increased risks of bleeding that occur with this procedure but often that is temporized  by the placement of the stent.  Slight increased risk of perforation however hopefully the axial slams will allow adequate drainage and apposition of the cyst wall and gastric wall.  The patient is aware that if we drain this she will need to be initiated on antibiotics and based on the findings of the inside of the pancreatic cyst will decide whether the patient will need necrosectomy or if she can be monitored with imaging in the follow-up period.  The risks and benefits of endoscopic evaluation were discussed with the patient; these include but are not limited to the risk of perforation, infection, bleeding, missed lesions, lack  of diagnosis, severe illness requiring hospitalization, as well as anesthesia and sedation related illnesses.  The patient and goddaughter are agreeable to proceed.    Justice Britain, MD [facility] Office # [phone]

## 2018-06-16 ENCOUNTER — Encounter (HOSPITAL_COMMUNITY): Payer: Self-pay | Admitting: *Deleted

## 2018-06-16 LAB — GLUCOSE, CAPILLARY: Glucose-Capillary: 197 mg/dL — ABNORMAL HIGH (ref 70–99)

## 2018-06-16 NOTE — Progress Notes (Signed)
Spoke with pt for pre-op call. Pt had endoscopy procedure done yesterday, states she's feeling fine today. States fasting blood sugar was 112 this morning. Had previous pre-op call a few days ago, states nothing has changed with her history.

## 2018-06-17 ENCOUNTER — Ambulatory Visit (HOSPITAL_COMMUNITY)
Admission: RE | Admit: 2018-06-17 | Discharge: 2018-06-17 | Disposition: A | Discharge status: Home / Self Care | Payer: Self-pay | Attending: Gastroenterology | Admitting: Gastroenterology

## 2018-06-17 ENCOUNTER — Other Ambulatory Visit: Payer: Self-pay

## 2018-06-17 ENCOUNTER — Ambulatory Visit (HOSPITAL_COMMUNITY): Payer: Self-pay | Admitting: Certified Registered Nurse Anesthetist

## 2018-06-17 ENCOUNTER — Encounter (HOSPITAL_COMMUNITY): Payer: Self-pay | Admitting: Gastroenterology

## 2018-06-17 ENCOUNTER — Encounter (HOSPITAL_COMMUNITY)
Admission: RE | Disposition: A | Discharge status: Home / Self Care | Payer: Self-pay | Source: Home / Self Care | Attending: Gastroenterology

## 2018-06-17 DIAGNOSIS — K311 Adult hypertrophic pyloric stenosis: Secondary | ICD-10-CM

## 2018-06-17 DIAGNOSIS — Z87891 Personal history of nicotine dependence: Secondary | ICD-10-CM | POA: Insufficient documentation

## 2018-06-17 DIAGNOSIS — K3189 Other diseases of stomach and duodenum: Secondary | ICD-10-CM

## 2018-06-17 DIAGNOSIS — Z7984 Long term (current) use of oral hypoglycemic drugs: Secondary | ICD-10-CM | POA: Insufficient documentation

## 2018-06-17 DIAGNOSIS — Z8719 Personal history of other diseases of the digestive system: Secondary | ICD-10-CM | POA: Insufficient documentation

## 2018-06-17 DIAGNOSIS — K863 Pseudocyst of pancreas: Principal | ICD-10-CM

## 2018-06-17 DIAGNOSIS — Z978 Presence of other specified devices: Secondary | ICD-10-CM

## 2018-06-17 DIAGNOSIS — K8689 Other specified diseases of pancreas: Secondary | ICD-10-CM | POA: Insufficient documentation

## 2018-06-17 DIAGNOSIS — K2210 Ulcer of esophagus without bleeding: Secondary | ICD-10-CM | POA: Insufficient documentation

## 2018-06-17 DIAGNOSIS — K862 Cyst of pancreas: Secondary | ICD-10-CM | POA: Insufficient documentation

## 2018-06-17 DIAGNOSIS — K859 Acute pancreatitis without necrosis or infection, unspecified: Secondary | ICD-10-CM

## 2018-06-17 DIAGNOSIS — E119 Type 2 diabetes mellitus without complications: Secondary | ICD-10-CM | POA: Insufficient documentation

## 2018-06-17 DIAGNOSIS — Z79899 Other long term (current) drug therapy: Secondary | ICD-10-CM | POA: Insufficient documentation

## 2018-06-17 DIAGNOSIS — I10 Essential (primary) hypertension: Secondary | ICD-10-CM | POA: Insufficient documentation

## 2018-06-17 HISTORY — PX: BILIARY STENT PLACEMENT: SHX5538

## 2018-06-17 HISTORY — PX: ESOPHAGOGASTRODUODENOSCOPY (EGD) WITH PROPOFOL: SHX5813

## 2018-06-17 HISTORY — PX: STENT REMOVAL: SHX6421

## 2018-06-17 LAB — GLUCOSE, CAPILLARY: Glucose-Capillary: 131 mg/dL — ABNORMAL HIGH (ref 70–99)

## 2018-06-17 SURGERY — ESOPHAGOGASTRODUODENOSCOPY (EGD) WITH PROPOFOL
Anesthesia: Monitor Anesthesia Care

## 2018-06-17 MED ORDER — EPHEDRINE SULFATE-NACL 50-0.9 MG/10ML-% IV SOSY
PREFILLED_SYRINGE | INTRAVENOUS | Status: DC | PRN
  Administered 2018-06-17 (×3): 10 mg via INTRAVENOUS
  Administered 2018-06-17: 5 mg via INTRAVENOUS

## 2018-06-17 MED ORDER — CIPROFLOXACIN IN D5W 400 MG/200ML IV SOLN
INTRAVENOUS | Status: DC | PRN
  Administered 2018-06-17: 400 mg via INTRAVENOUS

## 2018-06-17 MED ORDER — ONDANSETRON HCL 4 MG/2ML IJ SOLN
INTRAMUSCULAR | Status: DC | PRN
  Administered 2018-06-17: 4 mg via INTRAVENOUS

## 2018-06-17 MED ORDER — PROPOFOL 10 MG/ML IV BOLUS
INTRAVENOUS | Status: DC | PRN
  Administered 2018-06-17: 120 mg via INTRAVENOUS

## 2018-06-17 MED ORDER — FENTANYL CITRATE (PF) 100 MCG/2ML IJ SOLN
50.0000 ug | Freq: Once | INTRAMUSCULAR | Status: AC
  Administered 2018-06-17: 50 ug via INTRAVENOUS

## 2018-06-17 MED ORDER — LACTATED RINGERS IV SOLN
INTRAVENOUS | Status: DC
  Administered 2018-06-17: 11:00:00 via INTRAVENOUS

## 2018-06-17 MED ORDER — FENTANYL CITRATE (PF) 100 MCG/2ML IJ SOLN
INTRAMUSCULAR | Status: DC | PRN
  Administered 2018-06-17 (×2): 50 ug via INTRAVENOUS

## 2018-06-17 MED ORDER — CIPROFLOXACIN IN D5W 400 MG/200ML IV SOLN
INTRAVENOUS | Status: AC
  Filled 2018-06-17: qty 200

## 2018-06-17 MED ORDER — LIDOCAINE 20MG/ML (2%) 15 ML SYRINGE OPTIME
INTRAMUSCULAR | Status: DC | PRN
  Administered 2018-06-17: 100 mg via INTRAVENOUS

## 2018-06-17 MED ORDER — FENTANYL CITRATE (PF) 100 MCG/2ML IJ SOLN
INTRAMUSCULAR | Status: AC
  Filled 2018-06-17: qty 2

## 2018-06-17 MED ORDER — ROCURONIUM BROMIDE 10 MG/ML (PF) SYRINGE
PREFILLED_SYRINGE | INTRAVENOUS | Status: DC | PRN
  Administered 2018-06-17: 60 mg via INTRAVENOUS

## 2018-06-17 MED ORDER — SUGAMMADEX SODIUM 200 MG/2ML IV SOLN
INTRAVENOUS | Status: DC | PRN
  Administered 2018-06-17: 132.4 mg via INTRAVENOUS

## 2018-06-17 MED ORDER — MIDAZOLAM HCL 5 MG/5ML IJ SOLN
INTRAMUSCULAR | Status: DC | PRN
  Administered 2018-06-17: 1 mg via INTRAVENOUS

## 2018-06-17 SURGICAL SUPPLY — 14 items

## 2018-06-17 NOTE — Progress Notes (Signed)
Change care to [facility], RN

## 2018-06-17 NOTE — Discharge Instructions (Signed)
YOU HAD AN ENDOSCOPIC PROCEDURE TODAY: Refer to the procedure report and other information in the discharge instructions given to you for any specific questions about what was found during the examination. If this information does not answer your questions, please call [place] office at [phone] to clarify.  ° °YOU SHOULD EXPECT: Some feelings of bloating in the abdomen. Passage of more gas than usual. Walking can help get rid of the air that was put into your GI tract during the procedure and reduce the bloating. If you had a lower endoscopy (such as a colonoscopy or flexible sigmoidoscopy) you may notice spotting of blood in your stool or on the toilet paper. Some abdominal soreness may be present for a day or two, also. ° °DIET: Your first meal following the procedure should be a light meal and then it is ok to progress to your normal diet. A half-sandwich or bowl of soup is an example of a good first meal. Heavy or fried foods are harder to digest and may make you feel nauseous or bloated. Drink plenty of fluids but you should avoid alcoholic beverages for 24 hours. If you had a esophageal dilation, please see attached instructions for diet.   ° °ACTIVITY: Your care partner should take you home directly after the procedure. You should plan to take it easy, moving slowly for the rest of the day. You can resume normal activity the day after the procedure however YOU SHOULD NOT DRIVE, use power tools, machinery or perform tasks that involve climbing or major physical exertion for 24 hours (because of the sedation medicines used during the test).  ° °SYMPTOMS TO REPORT IMMEDIATELY: °A gastroenterologist can be reached at any hour. Please call [phone]  for any of the following symptoms:  °Following lower endoscopy (colonoscopy, flexible sigmoidoscopy) °Excessive amounts of blood in the stool  °Significant tenderness, worsening of abdominal pains  °Swelling of the abdomen that is new, acute  °Fever of 100° or  higher  °Following upper endoscopy (EGD, EUS, ERCP, esophageal dilation) °Vomiting of blood or coffee ground material  °New, significant abdominal pain  °New, significant chest pain or pain under the shoulder blades  °Painful or persistently difficult swallowing  °New shortness of breath  °Black, tarry-looking or red, bloody stools ° °FOLLOW UP:  °If any biopsies were taken you will be contacted by phone or by letter within the next 1-3 weeks. Call [phone]  if you have not heard about the biopsies in 3 weeks.  °Please also call with any specific questions about appointments or follow up tests. ° °

## 2018-06-17 NOTE — Op Note (Addendum)
[facility] Patient Name: Amanda Hahn Procedure Date : 06/17/2018 MRN: [id number] Attending MD: Justice Britain , MD Date of Birth: 25-Sep-1960 CSN: [id number] Age: 58 Admit Type: Outpatient Procedure:                Upper GI endoscopy Indications:              Therapeutic procedure, Pancreatic necrosis for                            Necrosectomy Providers:                Justice Britain, MD, Burtis Junes, RN, Elspeth Cho Tech., Technician, Cira Servant, CRNA Referring MD:             Milus Banister, MD, PA Chester Holstein Medicines:                General Anesthesia Complications:            No immediate complications. Estimated Blood Loss:     Estimated blood loss was minimal. Procedure:                Pre-Anesthesia Assessment:                           - Prior to the procedure, a History and Physical                            was performed, and patient medications and                            allergies were reviewed. The patient's tolerance of                            previous anesthesia was also reviewed. The risks                            and benefits of the procedure and the sedation                            options and risks were discussed with the patient.                            All questions were answered, and informed consent                            was obtained. Prior Anticoagulants: The patient has                            taken no previous anticoagulant or antiplatelet                            agents. ASA Grade Assessment: III - A patient with  severe systemic disease. After reviewing the risks                            and benefits, the patient was deemed in                            satisfactory condition to undergo the procedure.                           After obtaining informed consent, the endoscope was                            passed under direct vision. Throughout the                          procedure, the patient's blood pressure, pulse, and                            oxygen saturations were monitored continuously. The                            [id number]-[id number] ([id number] Olympus therapeutic                            gastroscope was introduced through the mouth, and                            advanced to the second part of duodenum. The upper                            GI endoscopy was accomplished without difficulty.                            The patient tolerated the procedure.                           Provation imaging capture went down during the case                            for >45 minutes and was not able to take further                            photos/images. Please see patient's MEDIA Tab on                            [facility] EPIC to see the end of the procedure results of                            the necrosectomy Scope In: Scope Out: Findings:      A single small non-bleeding erosion was found in the proximal esophagus.      No other gross lesions were noted in the entire esophagus.      Patchy moderately erythematous mucosa without bleeding was found in the  gastric body and in the gastric antrum.      A previously placed AXIOS cystgastrostomy stent was found on the       posterior wall of the stomach and within it was a double pigtail stent.       The one double pigtail stent was removed from the cystgastrostomy with a       snare. We then entered the cyst cavity. The cyst was partially filled       with fluid and necrotic tissue that was pasty and adherent to the cyst       wall - similar in appearance to recent EUS evaluation. Necrosectomy was       performed using the Endorotor Morcellation device, requiring multiple       intubations of the cyst. At the conclusion of the procedure, a       small-to-medium amount of necrotic tissue and a medium amount of pink,       viable tissue was found within the cyst wall cavity on direct  vision.       One 10 French x 4 cm double pigtail stent was inserted across the       cystgastrostomy tract, through the AXIOS so that we could minimize       necroma clogging up the AXIOS.      No gross lesions were noted in the duodenal bulb, in the first portion       of the duodenum and in the second portion of the duodenum. Impression:               - A single non-bleeding erosion in the proximal                            esophagus. No other gross lesions in esophagus.                           - Erythematous mucosa in the gastric body and                            antrum.                           - Pre-existing AXIOS cystgastrostomy stent was in                            place with a double pigtail within it. The double                            pigtail stent was removed from the patient.                            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 Necrosectomy was                            performed. Endorotor morcellation was used to clean                            the cavity with findings at the end of  small-to-medium amount of necrosis but with                            significant viable cyst wall cavity.                           - No gross lesions in the duodenal bulb, in the                            first portion of the duodenum and in the second                            portion of the duodenum. Recommendation:           - The patient will be observed post-procedure,                            until all discharge criteria are met.                           - Discharge patient to home.                           - Patient has a contact number available for                            emergencies. The signs and symptoms of potential                            delayed complications were discussed with the                            patient. Return to normal activities tomorrow.                            Written discharge instructions were  provided to the                            patient.                           - Low fat diet.                           - Continue present medications.                           - No aspirin, ibuprofen, naproxen, or other                            non-steroidal anti-inflammatory drugs for 1 week.                           - Repeat EGD with necrosectomy in 1 week for  retreatment.                           - If patient develops issues at home with                            fevers/chills/progressive abdominal                           pain/melena/hematemesis/coffee-ground emesis, she                            knows to call  oncall MD, call 911 and be                            taken to the nearest ED for further evaluation.                           - Continue Ciprofloxacin 500 mg BID as previously                            prescribed.                           - The findings and recommendations were discussed                            with the patient.                           - The findings and recommendations were discussed                            with the patient's family. Procedure Code(s):        --- Professional ---                           [phone], Esophagogastroduodenoscopy, flexible,                            transoral; diagnostic, including collection of                            specimen(s) by brushing or washing, when performed                            (separate procedure)                           [id number], Unlisted procedure, pancreas Diagnosis Code(s):        --- Professional ---                           K22.10, Ulcer of esophagus without bleeding                           K31.89, Other diseases of stomach and duodenum  Z97.8, Presence of other specified devices                           K86.89, Other specified diseases of pancreas CPT copyright 2018 [employer]. All rights reserved. The  codes documented in this report are preliminary and upon coder review may  be revised to meet current compliance requirements. Justice Britain, MD 06/17/2018 1:15:08 PM Number of Addenda: 0

## 2018-06-17 NOTE — Anesthesia Procedure Notes (Signed)
Procedure Name: Intubation Date/Time: 06/17/2018 11:33 AM Performed by: Lowella Dell, CRNA Pre-anesthesia Checklist: Patient identified, Emergency Drugs available, Suction available and Patient being monitored Patient Re-evaluated:Patient Re-evaluated prior to induction Oxygen Delivery Method: Circle System Utilized Preoxygenation: Pre-oxygenation with 100% oxygen Induction Type: IV induction Ventilation: Mask ventilation without difficulty Laryngoscope Size: Mac and 3 Tube type: Oral Tube size: 7.0 mm Number of attempts: 1 Airway Equipment and Method: Stylet Placement Confirmation: ETT inserted through vocal cords under direct vision,  positive ETCO2 and breath sounds checked- equal and bilateral Secured at: 20 cm Tube secured with: Tape Dental Injury: Teeth and Oropharynx as per pre-operative assessment

## 2018-06-17 NOTE — Progress Notes (Signed)
Hand off care to Jeanella Cara., RN

## 2018-06-17 NOTE — Anesthesia Postprocedure Evaluation (Signed)
Anesthesia Post Note  Patient: HAREEM SUROWIEC  Procedure(s) Performed: ESOPHAGOGASTRODUODENOSCOPY (EGD) WITH PROPOFOL (N/A ) ENDOROTOR [place] PLACEMENT     Patient location during evaluation: PACU Anesthesia Type: MAC Level of consciousness: awake and alert Pain management: pain level controlled Vital Signs Assessment: post-procedure vital signs reviewed and stable Respiratory status: spontaneous breathing, nonlabored ventilation, respiratory function stable and patient connected to nasal cannula oxygen Cardiovascular status: stable and blood pressure returned to baseline Postop Assessment: no apparent nausea or vomiting Anesthetic complications: no    Last Vitals:  Vitals:   06/17/18 1418 06/17/18 1430  BP:  119/69  Pulse: 94 91  Resp: (!) 23 (!) 26  Temp:    SpO2: 97% 96%    Last Pain:  Vitals:   06/17/18 1415  TempSrc:   PainSc: 4                  Malyna Budney DAVID

## 2018-06-17 NOTE — Transfer of Care (Signed)
Immediate Anesthesia Transfer of Care Note  Patient: Amanda Hahn  Procedure(s) Performed: ESOPHAGOGASTRODUODENOSCOPY (EGD) WITH PROPOFOL (N/A ) ENDOROTOR [place] PLACEMENT  Patient Location: PACU and Endoscopy Unit  Anesthesia Type:General  Level of Consciousness: awake, alert , oriented and patient cooperative  Airway & Oxygen Therapy: Patient Spontanous Breathing  Post-op Assessment: Report given to RN, Post -op Vital signs reviewed and stable and Patient moving all extremities  Post vital signs: Reviewed and stable  Last Vitals:  Vitals Value Taken Time  BP 112/55 06/17/2018  1:45 PM  Temp    Pulse 96 06/17/2018  1:48 PM  Resp 29 06/17/2018  1:48 PM  SpO2 95 % 06/17/2018  1:48 PM  Vitals shown include unvalidated device data.  Last Pain:  Vitals:   06/17/18 1332  TempSrc:   PainSc: 10-Worst pain ever         Complications: No apparent anesthesia complications

## 2018-06-17 NOTE — Interval H&P Note (Signed)
History and Physical Interval Note:  06/17/2018 9:43 AM  Amanda Hahn  has presented today for surgery, with the diagnosis of pancreatic cyst  The various methods of treatment have been discussed with the patient and family. After consideration of risks, benefits and other options for treatment, the patient has consented to  Procedure(s) with comments: ESOPHAGOGASTRODUODENOSCOPY (EGD) WITH PROPOFOL (N/A) - necosectomy as a surgical intervention .  The patient's history has been reviewed, patient examined, no change in status, stable for surgery.  I have reviewed the patient's chart and labs.  Questions were answered to the patient's satisfaction.    Plan for necrosectomy to be performed today of pancreatic necrosis s/p recent cystgastrostomy creation.  The risks of necrosectomy include bleeding, infection, aspiration pneumonia and intestinal perforation if the previously placed stent were to be dislodged.    [employer]

## 2018-06-17 NOTE — Anesthesia Preprocedure Evaluation (Addendum)
Anesthesia Evaluation  Patient identified by MRN, date of birth, ID band Patient awake    Reviewed: Allergy & Precautions, NPO status , Patient's Chart, lab work & pertinent test results  Airway Mallampati: I  TM Distance: >3 FB Neck ROM: Full    Dental  (+) Poor Dentition, Chipped, Missing, Dental Advisory Given   Pulmonary former smoker,    Pulmonary exam normal        Cardiovascular hypertension, Pt. on medications Normal cardiovascular exam     Neuro/Psych    GI/Hepatic   Endo/Other  diabetes, Type 2, Insulin Dependent  Renal/GU      Musculoskeletal   Abdominal   Peds  Hematology   Anesthesia Other Findings   Reproductive/Obstetrics                            Anesthesia Physical Anesthesia Plan  ASA: II  Anesthesia Plan: MAC   Post-op Pain Management:    Induction: Intravenous  PONV Risk Score and Plan: 2 and Treatment may vary due to age or medical condition  Airway Management Planned: Simple Face Mask  Additional Equipment:   Intra-op Plan:   Post-operative Plan:   Informed Consent: I have reviewed the patients History and Physical, chart, labs and discussed the procedure including the risks, benefits and alternatives for the proposed anesthesia with the patient or authorized representative who has indicated his/her understanding and acceptance.       Plan Discussed with: CRNA and Surgeon  Anesthesia Plan Comments:         Anesthesia Quick Evaluation

## 2018-06-18 ENCOUNTER — Telehealth: Payer: Self-pay

## 2018-06-18 ENCOUNTER — Encounter: Payer: Self-pay | Admitting: Gastroenterology

## 2018-06-18 NOTE — Telephone Encounter (Signed)
Per Dr Rush Landmark please contact pt to confirm procedure for 06/22/2018 .Called pt and left message asking for return call.  Need to confirm EGD for Monday 06/22/2018 toa:11:30, Proc time1:15pm @ [place]. I am routing this to [facility] as we discussed as I will not be in the office on 06/19/2018.

## 2018-06-19 ENCOUNTER — Telehealth: Payer: Self-pay | Admitting: Gastroenterology

## 2018-06-19 ENCOUNTER — Other Ambulatory Visit: Payer: Self-pay

## 2018-06-19 ENCOUNTER — Encounter (HOSPITAL_COMMUNITY): Payer: Self-pay | Admitting: Gastroenterology

## 2018-06-19 NOTE — Telephone Encounter (Signed)
The patient has been notified of this information and all questions answered. The pt has been advised of the information and verbalized understanding.    

## 2018-06-19 NOTE — Telephone Encounter (Signed)
The pt was instructed and verbalized understanding of the information.

## 2018-06-19 NOTE — Progress Notes (Signed)
Pt denies SOB, chest pain, and being under the care of a cardiologist. Pt stated " everything is the same since my last surgery." Pt made aware to stop taking vitamins, fish oil and herbal medications. Do not take any NSAIDs ie: Ibuprofen, Advil, Naproxen (Aleve), Motrin, BC and Goody Powder or any medication containing Aspirin. Pt bmade aware to not take Metformin on DOS. Pt made aware to check BG every 2 hours prior to arrival to hospital on DOS. Pt made aware to treat a BG < 70 with  4 ounces of apple  juice, wait 15 minutes after intervention to recheck BG, if BG remains < 70, call the Endoscopy Unit to speak with a nurse. Pt verbalized understanding of all pre-op instructions.

## 2018-06-22 ENCOUNTER — Ambulatory Visit (HOSPITAL_COMMUNITY): Payer: Self-pay | Admitting: Anesthesiology

## 2018-06-22 ENCOUNTER — Encounter (HOSPITAL_COMMUNITY)
Admission: RE | Disposition: A | Discharge status: Home / Self Care | Payer: Self-pay | Source: Ambulatory Visit | Attending: Gastroenterology

## 2018-06-22 ENCOUNTER — Encounter (HOSPITAL_COMMUNITY): Payer: Self-pay | Admitting: *Deleted

## 2018-06-22 ENCOUNTER — Other Ambulatory Visit: Payer: Self-pay

## 2018-06-22 ENCOUNTER — Ambulatory Visit (HOSPITAL_COMMUNITY)
Admission: RE | Admit: 2018-06-22 | Discharge: 2018-06-22 | Disposition: A | Discharge status: Home / Self Care | Payer: Self-pay | Source: Ambulatory Visit | Attending: Gastroenterology | Admitting: Gastroenterology

## 2018-06-22 DIAGNOSIS — K863 Pseudocyst of pancreas: Secondary | ICD-10-CM

## 2018-06-22 DIAGNOSIS — Z7984 Long term (current) use of oral hypoglycemic drugs: Secondary | ICD-10-CM | POA: Insufficient documentation

## 2018-06-22 DIAGNOSIS — K8689 Other specified diseases of pancreas: Secondary | ICD-10-CM

## 2018-06-22 DIAGNOSIS — I10 Essential (primary) hypertension: Secondary | ICD-10-CM | POA: Insufficient documentation

## 2018-06-22 DIAGNOSIS — Z87891 Personal history of nicotine dependence: Secondary | ICD-10-CM | POA: Insufficient documentation

## 2018-06-22 DIAGNOSIS — K259 Gastric ulcer, unspecified as acute or chronic, without hemorrhage or perforation: Secondary | ICD-10-CM | POA: Insufficient documentation

## 2018-06-22 DIAGNOSIS — K3189 Other diseases of stomach and duodenum: Secondary | ICD-10-CM

## 2018-06-22 DIAGNOSIS — K859 Acute pancreatitis without necrosis or infection, unspecified: Secondary | ICD-10-CM

## 2018-06-22 DIAGNOSIS — E119 Type 2 diabetes mellitus without complications: Secondary | ICD-10-CM | POA: Insufficient documentation

## 2018-06-22 DIAGNOSIS — Z931 Gastrostomy status: Secondary | ICD-10-CM | POA: Insufficient documentation

## 2018-06-22 DIAGNOSIS — K311 Adult hypertrophic pyloric stenosis: Secondary | ICD-10-CM

## 2018-06-22 DIAGNOSIS — Z978 Presence of other specified devices: Secondary | ICD-10-CM | POA: Insufficient documentation

## 2018-06-22 DIAGNOSIS — K319 Disease of stomach and duodenum, unspecified: Secondary | ICD-10-CM | POA: Insufficient documentation

## 2018-06-22 DIAGNOSIS — K862 Cyst of pancreas: Secondary | ICD-10-CM | POA: Insufficient documentation

## 2018-06-22 HISTORY — PX: ENDOROTOR: SHX6859

## 2018-06-22 HISTORY — PX: ESOPHAGOGASTRODUODENOSCOPY (EGD) WITH PROPOFOL: SHX5813

## 2018-06-22 HISTORY — PX: BILIARY STENT PLACEMENT: SHX5538

## 2018-06-22 HISTORY — PX: STENT REMOVAL: SHX6421

## 2018-06-22 HISTORY — DX: Presence of spectacles and contact lenses: Z97.3

## 2018-06-22 LAB — GLUCOSE, CAPILLARY: Glucose-Capillary: 103 mg/dL — ABNORMAL HIGH (ref 70–99)

## 2018-06-22 SURGERY — ENDOROTOR
Anesthesia: Monitor Anesthesia Care

## 2018-06-22 MED ORDER — ONDANSETRON HCL 4 MG/2ML IJ SOLN
INTRAMUSCULAR | Status: DC | PRN
  Administered 2018-06-22: 4 mg via INTRAVENOUS

## 2018-06-22 MED ORDER — FENTANYL CITRATE (PF) 250 MCG/5ML IJ SOLN
INTRAMUSCULAR | Status: DC | PRN
  Administered 2018-06-22 (×2): 50 ug via INTRAVENOUS

## 2018-06-22 MED ORDER — MIDAZOLAM HCL 5 MG/5ML IJ SOLN
INTRAMUSCULAR | Status: DC | PRN
  Administered 2018-06-22: 1 mg via INTRAVENOUS

## 2018-06-22 MED ORDER — LIDOCAINE 2% (20 MG/ML) 5 ML SYRINGE
INTRAMUSCULAR | Status: DC | PRN
  Administered 2018-06-22: 40 mg via INTRAVENOUS

## 2018-06-22 MED ORDER — CIPROFLOXACIN IN D5W 400 MG/200ML IV SOLN
INTRAVENOUS | Status: AC
  Filled 2018-06-22: qty 200

## 2018-06-22 MED ORDER — DEXAMETHASONE SODIUM PHOSPHATE 10 MG/ML IJ SOLN
INTRAMUSCULAR | Status: DC | PRN
  Administered 2018-06-22: 4 mg via INTRAVENOUS

## 2018-06-22 MED ORDER — SUCCINYLCHOLINE CHLORIDE 200 MG/10ML IV SOSY
PREFILLED_SYRINGE | INTRAVENOUS | Status: DC | PRN
  Administered 2018-06-22: 100 mg via INTRAVENOUS

## 2018-06-22 MED ORDER — LACTATED RINGERS IV SOLN
INTRAVENOUS | Status: DC | PRN
  Administered 2018-06-22: 14:00:00 via INTRAVENOUS

## 2018-06-22 MED ORDER — PHENYLEPHRINE 40 MCG/ML (10ML) SYRINGE FOR IV PUSH (FOR BLOOD PRESSURE SUPPORT)
PREFILLED_SYRINGE | INTRAVENOUS | Status: DC | PRN
  Administered 2018-06-22 (×2): 80 ug via INTRAVENOUS
  Administered 2018-06-22: 40 ug via INTRAVENOUS

## 2018-06-22 MED ORDER — MIDAZOLAM HCL 5 MG/5ML IJ SOLN: INTRAMUSCULAR | Status: DC | PRN

## 2018-06-22 MED ORDER — SODIUM CHLORIDE 0.9 % IV SOLN
INTRAVENOUS | Status: DC | PRN
  Administered 2018-06-22: 20 ug/min via INTRAVENOUS

## 2018-06-22 MED ORDER — SODIUM CHLORIDE 0.9 % IV SOLN: INTRAVENOUS | Status: DC

## 2018-06-22 MED ORDER — PROPOFOL 10 MG/ML IV BOLUS
INTRAVENOUS | Status: DC | PRN
  Administered 2018-06-22: 100 mg via INTRAVENOUS
  Administered 2018-06-22: 20 mg via INTRAVENOUS

## 2018-06-22 MED ORDER — CIPROFLOXACIN IN D5W 400 MG/200ML IV SOLN
INTRAVENOUS | Status: DC | PRN
  Administered 2018-06-22: 400 mg via INTRAVENOUS

## 2018-06-22 NOTE — Interval H&P Note (Signed)
History and Physical Interval Note:  06/22/2018 2:14 PM  Amanda Hahn  has presented today for surgery, with the diagnosis of Peripancreatuic fluid collection,Gastric outlet obstruction.  The various methods of treatment have been discussed with the patient and family. After consideration of risks, benefits and other options for treatment, the patient has consented to  Procedure(s) with comments: ESOPHAGOGASTRODUODENOSCOPY (EGD) (N/A) ENDOROTOR (N/A) - Necrosectomy as a surgical intervention.  The patient's history has been reviewed, patient examined, no change in status, stable for surgery.  I have reviewed the patient's chart and labs.  Questions were answered to the patient's satisfaction.     [employer]

## 2018-06-22 NOTE — Anesthesia Preprocedure Evaluation (Addendum)
Anesthesia Evaluation  Patient identified by MRN, date of birth, ID band Patient awake    Reviewed: Allergy & Precautions, NPO status , Patient's Chart, lab work & pertinent test results, reviewed documented beta blocker date and time   History of Anesthesia Complications Negative for: history of anesthetic complications  Airway Mallampati: I  TM Distance: >3 FB Neck ROM: Full    Dental  (+) Edentulous Upper, Poor Dentition, Missing, Chipped, Dental Advisory Given, Loose   Pulmonary former smoker (recently quit),    breath sounds clear to auscultation       Cardiovascular hypertension, Pt. on medications and Pt. on home beta blockers (-) angina Rhythm:Regular Rate:Normal     Neuro/Psych negative neurological ROS     GI/Hepatic Neg liver ROS, Gastric outlet obstruction   Endo/Other  diabetes (glu 103), Oral Hypoglycemic Agents  Renal/GU negative Renal ROS     Musculoskeletal   Abdominal (+) + obese,   Peds  Hematology negative hematology ROS (+)   Anesthesia Other Findings   Reproductive/Obstetrics                            Anesthesia Physical Anesthesia Plan  ASA: II  Anesthesia Plan: General   Post-op Pain Management:    Induction: Intravenous  PONV Risk Score and Plan: 3 and Treatment may vary due to age or medical condition, Ondansetron and Dexamethasone  Airway Management Planned: Oral ETT  Additional Equipment:   Intra-op Plan:   Post-operative Plan: Extubation in OR  Informed Consent: I have reviewed the patients History and Physical, chart, labs and discussed the procedure including the risks, benefits and alternatives for the proposed anesthesia with the patient or authorized representative who has indicated his/her understanding and acceptance.     Dental advisory given  Plan Discussed with: CRNA and Surgeon  Anesthesia Plan Comments: (Plan routine monitors,  GETA Dr. Rush Landmark requests GA with intubation)       Anesthesia Quick Evaluation

## 2018-06-22 NOTE — Transfer of Care (Signed)
Immediate Anesthesia Transfer of Care Note  Patient: Amanda Hahn  Procedure(s) Performed: Delane Ginger (N/A ) ESOPHAGOGASTRODUODENOSCOPY (EGD) WITH PROPOFOL (N/A ) STENT REMOVAL BILIARY STENT PLACEMENT  Patient Location: Endoscopy Unit  Anesthesia Type:General  Level of Consciousness: awake and alert   Airway & Oxygen Therapy: Patient Spontanous Breathing and Patient connected to nasal cannula oxygen  Post-op Assessment: Report given to RN and Post -op Vital signs reviewed and stable  Post vital signs: Reviewed and stable  Last Vitals:  Vitals Value Taken Time  BP    Temp    Pulse    Resp    SpO2      Last Pain:  Vitals:   06/22/18 1231  TempSrc: Oral  PainSc: 4       Patients Stated Pain Goal: 2 ([id number])  Complications: No apparent anesthesia complications

## 2018-06-22 NOTE — Anesthesia Procedure Notes (Signed)
Procedure Name: Intubation Date/Time: 06/22/2018 2:35 PM Performed by: Wilburn Cornelia, CRNA Pre-anesthesia Checklist: Patient identified, Emergency Drugs available, Suction available, Patient being monitored and Timeout performed Patient Re-evaluated:Patient Re-evaluated prior to induction Oxygen Delivery Method: Circle system utilized Preoxygenation: Pre-oxygenation with 100% oxygen Induction Type: IV induction Ventilation: Mask ventilation without difficulty Laryngoscope Size: Mac and 3 Grade View: Grade I Tube type: Oral Tube size: 7.0 mm Number of attempts: 1 Airway Equipment and Method: Stylet Placement Confirmation: ETT inserted through vocal cords under direct vision,  positive ETCO2,  CO2 detector and breath sounds checked- equal and bilateral Secured at: 21 cm Tube secured with: Tape Dental Injury: Teeth and Oropharynx as per pre-operative assessment

## 2018-06-22 NOTE — Op Note (Signed)
[facility] Patient Name: Amanda Hahn Procedure Date : 06/22/2018 MRN: [id number] Attending MD: Justice Britain , MD Date of Birth: 08-19-1960 CSN: [id number] Age: 58 Admit Type: Outpatient Procedure:                Upper GI endoscopy Indications:              Pancreatic necrosis Providers:                Justice Britain, MD, Burtis Junes, RN, Charolette Child, Technician, Merrilyn Puma, CRNA Referring MD:             PA Chester Holstein Milus Banister, MD Medicines:                General Anesthesia, Cipro 622 mg IV Complications:            No immediate complications. Estimated Blood Loss:     Estimated blood loss was minimal. Procedure:                Pre-Anesthesia Assessment:                           - Prior to the procedure, a History and Physical                            was performed, and patient medications and                            allergies were reviewed. The patient's tolerance of                            previous anesthesia was also reviewed. The risks                            and benefits of the procedure and the sedation                            options and risks were discussed with the patient.                            All questions were answered, and informed consent                            was obtained. Prior Anticoagulants: The patient has                            taken no previous anticoagulant or antiplatelet                            agents. ASA Grade Assessment: III - A patient with                            severe systemic disease. After reviewing the risks  and benefits, the patient was deemed in                            satisfactory condition to undergo the procedure.                           After obtaining informed consent, the endoscope was                            passed under direct vision. Throughout the                            procedure, the patient's blood  pressure, pulse, and                            oxygen saturations were monitored continuously. The                            [id number] ([id number]) Olympus therapeutic                            gastroscope was introduced through the mouth, and                            advanced to the second part of duodenum. The upper                            GI endoscopy was accomplished without difficulty.                            The patient tolerated the procedure. Scope In: Scope Out: Findings:      No gross lesions were noted in the entire esophagus.      A previously placed AXIOS cystgastrostomy stent was found on the       posterior wall of the stomach with a double pigtail stent present within       it as well. The one double pigtail stent was removed from the       cystgastrostomy with a snare. The AXIOS was entered. Within the cyst was       partially filled with fluid and necrotic tissue that was pasty and       adherent to the cyst wall. Necrosectomy was performed with a combination       of the Endorotor morcellation device, requiring multiple intubations of       the cyst. We also used the Raptor grasper to help with the removal of       necrotic debris. At the conclusion of the procedure, some necrotic       tissue and a medium amount of pink, viable tissue was found within the       pseudocyst on direct vision. One 7 French x 7 cm double pigtail stent       was inserted across the cystgastrostomy tract via the AXIOS.      Multiple dispersed, small non-bleeding erosions were found on the lesser       curvature of the stomach. There were no stigmata of recent bleeding.  No other gross lesions were noted in the entire examined stomach.      No gross lesions were noted in the duodenal bulb, in the first portion       of the duodenum and in the second portion of the duodenum. Impression:               - No gross lesions in esophagus.                           - Pre-existing AXIOS  cystgastrostomy stent with                            double pigtail stent was found. The double pigtail                            stent was removed. 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 - Necrosectomy                            was performed. A double pigtail was reinserted                            through the AXIOS to decrease risk of                           - Non-bleeding erosive gastropathy.                           - No gross lesions in the duodenal bulb, in the                            first portion of the duodenum and in the second                            portion of the duodenum. Recommendation:           - The patient will be observed post-procedure,                            until all discharge criteria are met.                           - Discharge patient to home.                           - Patient has a contact number available for                            emergencies. The signs and symptoms of potential                            delayed complications were discussed with the  patient. Return to normal activities tomorrow.                            Written discharge instructions were provided to the                            patient.                           - Resume previous diet - Low-fat diet.                           - Continue present medications.                           - No aspirin, ibuprofen, naproxen, or other                            non-steroidal anti-inflammatory drugs for 1 week.                           - Plan to proceed with repeat CT-Abdomen/Pelvis in                            2-weeks to evaluate for WON improvement and                            hopefully proceed with removal of AXIOS stent in                            the coming weeks.                           - Continue Ciprofloxacin 500 mg BID until stent is                            removed or  after CTAP is completed and results are                            noted.                           - If patient develops issues at home with                            fevers/chills/progressive abdominal                            pain/melena/hematemesis/coffee-ground emesi, she                            knows to call Charlett Nose MD, call 911 and be                            taken to the nearest ED for further evaluation.                           -  The findings and recommendations were discussed                            with the patient.                           - The findings and recommendations were discussed                            with the patient's family. Procedure Code(s):        --- Professional ---                           [phone], Esophagogastroduodenoscopy, flexible,                            transoral; diagnostic, including collection of                            specimen(s) by brushing or washing, when performed                            (separate procedure)                           [id number], Unlisted procedure, pancreas Diagnosis Code(s):        --- Professional ---                           K31.89, Other diseases of stomach and duodenum                           Z97.8, Presence of other specified devices                           K86.89, Other specified diseases of pancreas CPT copyright 2018 American Medical Association. All rights reserved. The codes documented in this report are preliminary and upon coder review may  be revised to meet current compliance requirements. Justice Britain, MD 06/22/2018 4:35:48 PM Number of Addenda: 0

## 2018-06-22 NOTE — Discharge Instructions (Signed)
YOU HAD AN ENDOSCOPIC PROCEDURE TODAY: Refer to the procedure report and other information in the discharge instructions given to you for any specific questions about what was found during the examination. If this information does not answer your questions, please call [place] office at [phone] to clarify.  ° °YOU SHOULD EXPECT: Some feelings of bloating in the abdomen. Passage of more gas than usual. Walking can help get rid of the air that was put into your GI tract during the procedure and reduce the bloating. If you had a lower endoscopy (such as a colonoscopy or flexible sigmoidoscopy) you may notice spotting of blood in your stool or on the toilet paper. Some abdominal soreness may be present for a day or two, also. ° °DIET: Your first meal following the procedure should be a light meal and then it is ok to progress to your normal diet. A half-sandwich or bowl of soup is an example of a good first meal. Heavy or fried foods are harder to digest and may make you feel nauseous or bloated. Drink plenty of fluids but you should avoid alcoholic beverages for 24 hours. If you had a esophageal dilation, please see attached instructions for diet.   ° °ACTIVITY: Your care partner should take you home directly after the procedure. You should plan to take it easy, moving slowly for the rest of the day. You can resume normal activity the day after the procedure however YOU SHOULD NOT DRIVE, use power tools, machinery or perform tasks that involve climbing or major physical exertion for 24 hours (because of the sedation medicines used during the test).  ° °SYMPTOMS TO REPORT IMMEDIATELY: °A gastroenterologist can be reached at any hour. Please call [phone]  for any of the following symptoms:  °Following lower endoscopy (colonoscopy, flexible sigmoidoscopy) °Excessive amounts of blood in the stool  °Significant tenderness, worsening of abdominal pains  °Swelling of the abdomen that is new, acute  °Fever of 100° or  higher  °Following upper endoscopy (EGD, EUS, ERCP, esophageal dilation) °Vomiting of blood or coffee ground material  °New, significant abdominal pain  °New, significant chest pain or pain under the shoulder blades  °Painful or persistently difficult swallowing  °New shortness of breath  °Black, tarry-looking or red, bloody stools ° °FOLLOW UP:  °If any biopsies were taken you will be contacted by phone or by letter within the next 1-3 weeks. Call [phone]  if you have not heard about the biopsies in 3 weeks.  °Please also call with any specific questions about appointments or follow up tests. ° °

## 2018-06-23 NOTE — Progress Notes (Signed)
CT scan was pushed forward 2 weeks per request.

## 2018-06-23 NOTE — Anesthesia Postprocedure Evaluation (Signed)
Anesthesia Post Note  Patient: Amanda Hahn  Procedure(s) Performed: Delane Ginger (N/A ) ESOPHAGOGASTRODUODENOSCOPY (EGD) WITH PROPOFOL (N/A ) [place]     Patient location during evaluation: Endoscopy Anesthesia Type: MAC Level of consciousness: awake and sedated Pain management: pain level controlled Vital Signs Assessment: post-procedure vital signs reviewed and stable Respiratory status: spontaneous breathing Cardiovascular status: stable Postop Assessment: no apparent nausea or vomiting Anesthetic complications: no    Last Vitals:  Vitals:   06/22/18 1630 06/22/18 1640  BP: (!) 154/66 122/75  Pulse: 98 (!) 101  Resp: (!) 24 (!) 27  Temp:    SpO2: 97% 93%    Last Pain:  Vitals:   06/22/18 1640  TempSrc:   PainSc: 0-No pain   Pain Goal: Patients Stated Pain Goal: 2 (06/22/18 1231)                 Huston Foley

## 2018-06-24 ENCOUNTER — Telehealth: Payer: Self-pay | Admitting: Gastroenterology

## 2018-06-24 ENCOUNTER — Other Ambulatory Visit: Payer: Self-pay

## 2018-06-24 DIAGNOSIS — R1013 Epigastric pain: Secondary | ICD-10-CM

## 2018-06-24 MED ORDER — HYDROCODONE-ACETAMINOPHEN 5-325 MG PO TABS: 1.0000 | ORAL_TABLET | Freq: Four times a day (QID) | ORAL | 0 refills | Status: DC | PRN

## 2018-06-24 MED ORDER — HYDROCODONE-ACETAMINOPHEN 5-325 MG PO TABS: 1.0000 | ORAL_TABLET | Freq: Four times a day (QID) | ORAL | Status: AC | PRN

## 2018-06-24 NOTE — Telephone Encounter (Signed)
Pt has abd pain in the center above the belly button that started yesterday.  Movement does not hurt but trying to get up from the bed or chair hurts.  Took hydrocodone and that did help. No fever, Last BM was yesterday and it was normal.  No nausea or vomiting.  Had endoscopy on 3/9. Please advise

## 2018-06-24 NOTE — Telephone Encounter (Signed)
Should be taken care of now. Thanks.

## 2018-06-24 NOTE — Telephone Encounter (Signed)
The pt will have labs prior to CT scan

## 2018-06-24 NOTE — Telephone Encounter (Signed)
Dr Rush Landmark the hydrocodone prescription did not go to the pharmacy.  Can you resend?  It looks like it was ordered as a facility administered

## 2018-06-24 NOTE — Telephone Encounter (Signed)
CT at [facility] called to inform that they will need labs on pt prior to CT that has been rescheduled to 3.26.20 due to pt being diabetic.  Pt has OV with Dr. Rush Landmark 3.17.20.

## 2018-06-24 NOTE — Telephone Encounter (Signed)
Pt states she is in a lot of pain today and only has one pain pill left asking what she can do help with this pain. Best call back # [phone].

## 2018-06-24 NOTE — Telephone Encounter (Signed)
I have called and spoken with the patient this afternoon.  She feels somewhat better than earlier however is still describing discomfort.  She was moving furniture and doing other more significant issues/things yesterday and feels that this could be playing some role.  She has been able to eat and it is not exacerbating her food pain.  She is staying hydrated.  Her bowel movement was brown and normal for her no evidence of any melena or dark black stools.  I have discussed with the patient to try and take it a bit easier today and the rest of the week.  Is not clear to me that she has evidence of pancreatitis or stent dysfunction however with recent procedure we will want to be careful.  I will prescribe some extra doses of hydrocodone for use over the course of the next few days.  She understands that if the pain progresses significantly tonight into tomorrow or is persisting that I would like her to come in tomorrow for labs to include a CBC/hepatic function panel/BMP/lipase/amylase to ensure she has no other issues and also obtain a KUB 2 view.  If things are persisting and that work-up is negative she may require a more urgent CT abdomen/pelvis with contrast.  She will alert [place] if there are any other issues in the interim.  We will call her tomorrow to see how she is doing.  We will place the prescription for her medications now.  Patty, please check in with pharmacy to ensure that her prescription has been sent.  Thank you.  Justice Britain, MD [facility] Office # [phone]

## 2018-06-24 NOTE — Addendum Note (Signed)
Addended by: Justice Britain on: 06/24/2018 12:14 PM   Modules accepted: Orders

## 2018-06-25 ENCOUNTER — Encounter (HOSPITAL_COMMUNITY): Payer: Self-pay | Admitting: Gastroenterology

## 2018-06-25 ENCOUNTER — Ambulatory Visit (HOSPITAL_COMMUNITY): Payer: Self-pay

## 2018-06-26 ENCOUNTER — Telehealth: Payer: Self-pay

## 2018-06-26 NOTE — Telephone Encounter (Signed)
Can you check in on patient today? Thanks. GM

## 2018-06-26 NOTE — Telephone Encounter (Signed)
Left message on machine to call back  

## 2018-06-26 NOTE — Telephone Encounter (Signed)
Mansouraty, Telford Nab., MD 2 hours ago (6:07 AM)      Can you check in on patient today? Thanks. GM      Documentation

## 2018-06-29 NOTE — Telephone Encounter (Signed)
appt cancelled and rescheduled as ordered

## 2018-06-29 NOTE — Telephone Encounter (Signed)
Left message on machine to call back   Dr Rush Landmark I have tried to reach the pt multiple times without success.

## 2018-06-29 NOTE — Telephone Encounter (Signed)
The patient called back my phone number to my computer/console this afternoon. Patient is doing well and much improved from last week.  She still having some discomfort but she is back to work and feeling well and eating well.  Biggest issue that she is dealing with at this time is some hard stools and constipation. She is not noting any blood in her stools. I recommended that we initiate stool softeners docusate 100 mg twice daily. I recommend that we initiate MiraLAX once daily. If after 2 days no bowel movements then may try a glycerin or bisacodyl suppository. I suspect her constipation is a result of the infrequent use of the narcotics which were given to her recently for pain post procedure from pancreatic necrosectomy. We will plan to postpone her clinic visit tomorrow as it has not been 2 weeks since her last exam and I want her to have a full 2 weeks before we perform a CT and then see her back in clinic. We will work on canceling her procedure and then proceeding with her follow-up 1 to 2 days after her CT scan is completed later this month. I have asked her to also try and start cutting back on her narcotic use as able and use Tylenol or nonsteroidals. She is thankful for the call back and discussion and hopeful to keep doing well. Patty please go ahead and cancel her upcoming clinic visit on 3/15 and ensure we have a clinic follow-up 1 to 2 days after her scheduled CT scan later this month. Thank you. GM

## 2018-06-29 NOTE — Telephone Encounter (Signed)
Thank you for the update. I have reached out to the patient and also left a voicemail. I tried calling the emergency contact without success. Amanda Hahn if we could try and reach out to her 1 more time this afternoon and once tomorrow in the morning and that would be great. Thanks for trying. GM

## 2018-06-30 ENCOUNTER — Ambulatory Visit: Payer: Self-pay | Admitting: Gastroenterology

## 2018-07-02 ENCOUNTER — Telehealth: Payer: Self-pay | Admitting: Gastroenterology

## 2018-07-02 NOTE — Telephone Encounter (Signed)
Patty thank you for reaching out. She can have the scan stretched out by no more than 1-2 weeks, it cannot wait that long. Thank you. GM

## 2018-07-02 NOTE — Telephone Encounter (Signed)
I spoke with imaging and they will leave her as planned.

## 2018-07-02 NOTE — Telephone Encounter (Signed)
Dr Joycelyn Man please advise

## 2018-07-02 NOTE — Telephone Encounter (Signed)
Tammy from [facility] imaging ctr needs to know if pt's ct scan can be r/s to 4 to 6 weeks. Pt is scheduled for next week on 3/26.

## 2018-07-06 ENCOUNTER — Other Ambulatory Visit: Payer: Self-pay

## 2018-07-06 DIAGNOSIS — K311 Adult hypertrophic pyloric stenosis: Secondary | ICD-10-CM

## 2018-07-06 DIAGNOSIS — R1013 Epigastric pain: Secondary | ICD-10-CM

## 2018-07-06 DIAGNOSIS — K862 Cyst of pancreas: Secondary | ICD-10-CM

## 2018-07-06 DIAGNOSIS — K863 Pseudocyst of pancreas: Secondary | ICD-10-CM

## 2018-07-06 DIAGNOSIS — K8689 Other specified diseases of pancreas: Secondary | ICD-10-CM

## 2018-07-06 DIAGNOSIS — K859 Acute pancreatitis without necrosis or infection, unspecified: Secondary | ICD-10-CM

## 2018-07-08 ENCOUNTER — Other Ambulatory Visit (INDEPENDENT_AMBULATORY_CARE_PROVIDER_SITE_OTHER): Payer: Self-pay

## 2018-07-08 DIAGNOSIS — K862 Cyst of pancreas: Secondary | ICD-10-CM

## 2018-07-08 DIAGNOSIS — K863 Pseudocyst of pancreas: Secondary | ICD-10-CM

## 2018-07-08 DIAGNOSIS — K8689 Other specified diseases of pancreas: Secondary | ICD-10-CM

## 2018-07-08 DIAGNOSIS — K859 Acute pancreatitis without necrosis or infection, unspecified: Secondary | ICD-10-CM

## 2018-07-08 DIAGNOSIS — K311 Adult hypertrophic pyloric stenosis: Secondary | ICD-10-CM

## 2018-07-08 DIAGNOSIS — R1013 Epigastric pain: Secondary | ICD-10-CM

## 2018-07-08 LAB — COMPREHENSIVE METABOLIC PANEL
ALT: 6 U/L (ref 0–35)
AST: 16 U/L (ref 0–37)
Albumin: 4 g/dL (ref 3.5–5.2)
Alkaline Phosphatase: 98 U/L (ref 39–117)
BUN: 19 mg/dL (ref 6–23)
CO2: 21 meq/L (ref 19–32)
Calcium: 9.6 mg/dL (ref 8.4–10.5)
Chloride: 108 mEq/L (ref 96–112)
Creatinine, Ser: 1.21 mg/dL — ABNORMAL HIGH (ref 0.40–1.20)
GFR: 55 mL/min — ABNORMAL LOW (ref >60.00)
Glucose, Bld: 148 mg/dL — ABNORMAL HIGH (ref 70–99)
Potassium: 3.3 mEq/L — ABNORMAL LOW (ref 3.5–5.1)
Sodium: 140 mEq/L (ref 135–145)
Total Bilirubin: 0.3 mg/dL (ref 0.2–1.2)
Total Protein: 8.2 g/dL (ref 6.0–8.3)

## 2018-07-09 ENCOUNTER — Ambulatory Visit (HOSPITAL_COMMUNITY)
Admission: RE | Admit: 2018-07-09 | Discharge: 2018-07-09 | Disposition: A | Discharge status: Home / Self Care | Payer: Self-pay | Source: Ambulatory Visit | Attending: Nurse Practitioner | Admitting: Nurse Practitioner

## 2018-07-09 ENCOUNTER — Other Ambulatory Visit: Payer: Self-pay

## 2018-07-09 DIAGNOSIS — K863 Pseudocyst of pancreas: Secondary | ICD-10-CM | POA: Insufficient documentation

## 2018-07-09 DIAGNOSIS — K859 Acute pancreatitis without necrosis or infection, unspecified: Secondary | ICD-10-CM | POA: Insufficient documentation

## 2018-07-09 DIAGNOSIS — K862 Cyst of pancreas: Secondary | ICD-10-CM | POA: Insufficient documentation

## 2018-07-09 MED ORDER — IOHEXOL 300 MG/ML  SOLN
100.0000 mL | Freq: Once | INTRAMUSCULAR | Status: AC | PRN
  Administered 2018-07-09: 100 mL via INTRAVENOUS

## 2018-07-09 MED ORDER — SODIUM CHLORIDE (PF) 0.9 % IJ SOLN
INTRAMUSCULAR | Status: AC
  Filled 2018-07-09: qty 50

## 2018-07-10 ENCOUNTER — Telehealth: Payer: Self-pay | Admitting: Gastroenterology

## 2018-07-10 MED ORDER — HYDROCODONE-ACETAMINOPHEN 5-325 MG PO TABS: 1.0000 | ORAL_TABLET | Freq: Four times a day (QID) | ORAL | 0 refills | Status: DC | PRN

## 2018-07-10 NOTE — Telephone Encounter (Signed)
Pt calling for a refill on her pain medication. Pt requesting Norco.

## 2018-07-10 NOTE — Telephone Encounter (Signed)
Patient reports pain, epigastric and upper abdomen.  She reports that she has occasional chills.  She is reporting tylenol is not helping her pain.  She is requesting something for pain.

## 2018-07-10 NOTE — Telephone Encounter (Signed)
Please see prior notation from other telephone note.

## 2018-07-10 NOTE — Addendum Note (Signed)
Addended by: Justice Britain on: 07/10/2018 05:19 PM   Modules accepted: Orders

## 2018-07-10 NOTE — Telephone Encounter (Signed)
Attempted to call the patient back this afternoon. I received a voicemail. I have reviewed the patient's CT scan from yesterday and there has been significant improvement overall. The patient's cyst gastrostomy stent has shown a near complete decompression of the pseudocyst with only residual persisting fluid from the remnant pancreas tissue in the lower mediastinum. She has pancreatic head calcifications. She has a splenic hilum lesion of unclear etiology that is 12 mm in size. She also has a nodule on the left adrenal gland that is incompletely characterized. The double-pigtail stent has migrated out of the cyst cavity wall and is in the small bowel. She has stool burden and constipation. She has an abdominal wall hernia. Patient's still has residual inflammatory changes adjacent to the pseudocyst/walled off necrosis with small lymph nodes.  The patient is planned for a visit next week. She called in with concern for increased abdominal discomfort and she was concerned that whether this was the stent. I tried to reach her as noted above but was unable to. The patient has been aware over the course the last month that if she had progressive abdominal pains or worsening pain that were significant to reach out to [place] and that if things persisted or worsens more significantly she needed to be evaluated at her local ED. As she is done well with the opioid therapy from previous use I have sent a prescription for 5 days worth of Norco so that she may use that over the weekend if necessary. I have asked her to reach out to [place] if anything changes over the weekend in case she needs to be seen more urgently over the weekend in the hospital. She was aware of prior recommendations that any fevers or chills that she would need to be further evaluated. In the setting of the COVID-19 we will have to consider whether some of her issues could be associated with that or other issues not related to stent.  I  hopefully will be able to remove the stent in the next 1 to 2 weeks.  Justice Britain, MD [facility] Office # [phone]

## 2018-07-10 NOTE — Telephone Encounter (Signed)
Pt call said she is having stomach pain and would like to know if she can get some pain med. Pt said she has a stent and thinks that could be the reason.

## 2018-07-10 NOTE — Telephone Encounter (Signed)
I have called [facility] to cancel the Rx sent there and there is a new Rx sent to the [facility].  Justice Britain, MD [facility] Office # [phone]

## 2018-07-10 NOTE — Telephone Encounter (Signed)
Pt wants prescription called in to [facility], [place] [place], [place] [address] Phone: [phone]

## 2018-07-10 NOTE — Telephone Encounter (Signed)
Pt wants to make sure the script was sent to [facility] pharmacy, see note below.

## 2018-07-10 NOTE — Telephone Encounter (Signed)
Please reach out to patient and let her know that it is sent to [facility] now.  Vaughan Basta, can you please cancel the [facility] Rx that was sent.  Thank you. GM

## 2018-07-13 NOTE — Telephone Encounter (Signed)
Pt aware.

## 2018-07-14 ENCOUNTER — Ambulatory Visit (INDEPENDENT_AMBULATORY_CARE_PROVIDER_SITE_OTHER): Payer: Self-pay | Admitting: Gastroenterology

## 2018-07-14 ENCOUNTER — Other Ambulatory Visit: Payer: Self-pay

## 2018-07-14 DIAGNOSIS — K8689 Other specified diseases of pancreas: Secondary | ICD-10-CM

## 2018-07-14 DIAGNOSIS — K862 Cyst of pancreas: Secondary | ICD-10-CM

## 2018-07-14 DIAGNOSIS — R935 Abnormal findings on diagnostic imaging of other abdominal regions, including retroperitoneum: Secondary | ICD-10-CM

## 2018-07-14 DIAGNOSIS — R109 Unspecified abdominal pain: Secondary | ICD-10-CM

## 2018-07-14 DIAGNOSIS — K863 Pseudocyst of pancreas: Secondary | ICD-10-CM

## 2018-07-14 MED ORDER — CIPROFLOXACIN HCL 500 MG PO TABS: 500.0000 mg | ORAL_TABLET | Freq: Two times a day (BID) | ORAL | 0 refills | Status: DC

## 2018-07-14 NOTE — Patient Instructions (Addendum)
If you are age 60 or older, your body mass index should be between 23-30. Your There is no height or weight on file to calculate BMI. If this is out of the aforementioned range listed, please consider follow up with your Primary Care Provider.  If you are age 54 or younger, your body mass index should be between 19-25. Your There is no height or weight on file to calculate BMI. If this is out of the aformentioned range listed, please consider follow up with your Primary Care Provider.   You have been scheduled for an endoscopy. Please follow written instructions given to you at your visit today. If you use inhalers (even only as needed), please bring them with you on the day of your procedure. Your physician has requested that you go to www.startemmi.com and enter the access code given to you at your visit today. This web site gives a general overview about your procedure. However, you should still follow specific instructions given to you by our office regarding your preparation for the procedure.  We have sent the following medications to your pharmacy for you to pick up at your convenience: Cipro  Thank you for choosing me and [facility].  Dr. Rush Landmark

## 2018-07-14 NOTE — H&P (View-Only) (Signed)
[facility] VISIT   Primary Care Provider Patient, No Pcp Per No address on file None  Patient Profile: Amanda Hahn is a 58 y.o. 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  The patient presents to the [facility] for an evaluation and management of problem(s) noted below:  Problem List 1. Pancreatic pseudocyst/cyst   2. Pancreatic necrosis   3. Abdominal pain, unspecified abdominal location   4. Abnormal CT of the abdomen     History of Present Illness: Due to the COVID-19 Pandemic, this service was provided via telemedicine using Facetime/Telephone. Interactive audio and video telecommunications were attempted between this provider and patient, however failed, due to patient having technical difficulties and thus to provide timely and excellent care, we continued and completed visit with audio only. The patient was located in her car driving back from the supermarket. The provider was located in the office. The patient did consent to this visit and is aware of charges through their insurance. Other persons participating in this telemedicine service were the patient's daughter who was in the car with her. Time spent on visit was 21 minutes.  Overall, the patient has done well since all of this process started.  She has been abstaining from all alcohol consumption for months.  However, the patient still does report recurrent episodes of abdominal discomfort and pain that occur throughout the abdomen and can last minutes to hours at a time.  More recently, they have been less occurring and she has been attributing things to her being more significantly mobile and working.  With it being said the patient does not describe  progressive worsening of pain when she eats or if she fasts.  If she takes time and rests and takes pain medications it can be helpful at times.  Tylenol has not been relieving her symptoms currently.  She was recently prescribed a short course of opioid therapy in effort of trying to get her to this visit today.  Has been taking her blood pressures and watching her sugars closely and she has been doing well.  Patient is able to eat and has been gaining weight.  She feels that she is going to be able to remain abstinent from all alcohol which is very good news.  She does have some mild indigestion and heartburn but overall has been able to tolerate things well being on no significant acid suppression.  She is no longer smoking tobacco.  GI Review of Systems Positive as above including infrequent bloating Negative for dysphagia, odynophagia, change in bowel habits, melena, hematochezia  Review of Systems General: Denies fevers/chills/weight loss HEENT: Denies oral lesions Cardiovascular: Denies chest pain Pulmonary: Denies current shortness of breath/cough Gastroenterological: See HPI Genitourinary: Denies darkened urine Hematological: Denies easy bruising Dermatological: Denies jaundice Psychological: Mood is stable   Medications Current Outpatient Medications  Medication Sig Dispense Refill  . amLODipine (NORVASC) 10 MG tablet Take 1 tablet (10 mg total) by mouth daily. 30 tablet 2  . ciprofloxacin (CIPRO) 500 MG tablet Take 1 tablet (500 mg total) by mouth 2 (two) times daily. 10 tablet 0  . metFORMIN (GLUCOPHAGE) 500 MG tablet Take 1 tablet (500 mg total) by mouth 2 (two) times daily with a meal. 60 tablet 2  . metoprolol tartrate (LOPRESSOR) 25 MG tablet Take 1 tablet (25 mg total) by mouth  2 (two) times daily. 60 tablet 2  . vitamin C (ASCORBIC ACID) 500 MG tablet Take 500 mg by mouth daily.     Current Facility-Administered Medications  Medication Dose Route Frequency Provider Last  Rate Last Dose  . HYDROcodone-acetaminophen (NORCO/VICODIN) 5-325 MG per tablet 1 tablet  1 tablet Oral Q6H PRN Mansouraty, Telford Nab., MD        Allergies No Known Allergies  Histories Past Medical History:  Diagnosis Date  . Anemia   . Diabetes mellitus without complication ([place])    Type II  . History of blood transfusion 2011  . History of pancreatitis   . Hypertension   . Wears glasses    Past Surgical History:  Procedure Laterality Date  . BALLOON DILATION N/A 06/15/2018   Procedure: BALLOON DILATION;  Surgeon: Rush Landmark Telford Nab., MD;  Location: [place];  Service: Gastroenterology;  Laterality: N/A;  . BILIARY STENT PLACEMENT  06/15/2018   Procedure: BILIARY STENT PLACEMENT;  Surgeon: Rush Landmark Telford Nab., MD;  Location: [place];  Service: Gastroenterology;;  . BILIARY STENT PLACEMENT  06/17/2018   Procedure: BILIARY STENT PLACEMENT;  Surgeon: Irving Copas., MD;  Location: [place];  Service: Gastroenterology;;  . BILIARY STENT PLACEMENT  06/22/2018   Procedure: BILIARY STENT PLACEMENT;  Surgeon: Irving Copas., MD;  Location: [place];  Service: Gastroenterology;;  . BIOPSY  06/15/2018   Procedure: BIOPSY;  Surgeon: Irving Copas., MD;  Location: [place];  Service: Gastroenterology;;  . CESAREAN SECTION     x2  . ENDOROTOR N/A 06/22/2018   Procedure: [id number];  Surgeon: Mansouraty, Telford Nab., MD;  Location: [place];  Service: Gastroenterology;  Laterality: N/A;  Necrosectomy  . ESOPHAGOGASTRODUODENOSCOPY     x2  . ESOPHAGOGASTRODUODENOSCOPY (EGD) WITH PROPOFOL N/A 06/15/2018   Procedure: ESOPHAGOGASTRODUODENOSCOPY (EGD) WITH PROPOFOL;  Surgeon: Rush Landmark Telford Nab., MD;  Location: [place];  Service: Gastroenterology;  Laterality: N/A;  . ESOPHAGOGASTRODUODENOSCOPY (EGD) WITH PROPOFOL N/A 06/17/2018   Procedure: ESOPHAGOGASTRODUODENOSCOPY (EGD) WITH PROPOFOL;  Surgeon: Rush Landmark Telford Nab., MD;  Location: [place];  Service: Gastroenterology;  Laterality: N/A;  necosectomy  . ESOPHAGOGASTRODUODENOSCOPY (EGD) WITH PROPOFOL N/A 06/22/2018   Procedure: ESOPHAGOGASTRODUODENOSCOPY (EGD) WITH PROPOFOL;  Surgeon: Rush Landmark Telford Nab., MD;  Location: [place];  Service: Gastroenterology;  Laterality: N/A;  . EUS N/A 06/15/2018   Procedure: UPPER ENDOSCOPIC ULTRASOUND (EUS) RADIAL;  Surgeon: Rush Landmark Telford Nab., MD;  Location: [place];  Service: Gastroenterology;  Laterality: N/A;  AXIOS STENTING  . PANCREATIC STENT PLACEMENT  06/15/2018   Procedure: PANCREATIC STENT PLACEMENT;  Surgeon: Rush Landmark Telford Nab., MD;  Location: [place];  Service: Gastroenterology;;  . Lavell Islam REMOVAL  06/17/2018   Procedure: STENT REMOVAL;  Surgeon: Irving Copas., MD;  Location: [place];  Service: Gastroenterology;;  . Lavell Islam REMOVAL  06/22/2018   Procedure: STENT REMOVAL;  Surgeon: Irving Copas., MD;  Location: [place];  Service: Gastroenterology;;   Social History   Socioeconomic History  . Marital status: Single    Spouse name: Not on file  . Number of children: Not on file  . Years of education: Not on file  . Highest education level: Not on file  Occupational History  . Not on file  Social Needs  . Financial resource strain: Not on file  . Food insecurity:    Worry: Not on file    Inability: Not on file  . Transportation needs:    Medical: Not on file    Non-medical: Not on file  Tobacco Use  . Smoking status: Former Smoker    Years: 40.00    Last attempt to quit: 05/24/2018    Years since quitting: 0.1  . Smokeless tobacco: Never Used  Substance and Sexual Activity  . Alcohol use: Not Currently  . Drug use: Never  . Sexual activity: Not Currently  Lifestyle  . Physical activity:    Days per week: Not on file    Minutes per session: Not on file  . Stress: Not on file  Relationships  . Social connections:    Talks on phone: Not on file    Gets together:  Not on file    Attends religious service: Not on file    Active member of club or organization: Not on file    Attends meetings of clubs or organizations: Not on file    Relationship status: Not on file  . Intimate partner violence:    Fear of current or ex partner: Not on file    Emotionally abused: Not on file    Physically abused: Not on file    Forced sexual activity: Not on file  Other Topics Concern  . Not on file  Social History Narrative  . Not on file   Family History  Problem Relation Age of Onset  . CAD Mother   . Colon cancer Father   . Esophageal cancer Neg Hx   . Inflammatory bowel disease Neg Hx   . Liver disease Neg Hx   . Pancreatic cancer Neg Hx   . Stomach cancer Neg Hx    I have reviewed her medical, social, and family history in detail and updated the electronic medical record as necessary.    PHYSICAL EXAMINATION  Telehealth visit  REVIEW OF DATA  I reviewed the following data at the time of this encounter:  GI Procedures and Studies  June 22, 2026 EGD - No gross lesions in esophagus. - Pre-existing AXIOS cystogastrostomy stent with double pigtail stent was found. The double pigtail stent was removed. The Cystogastrostomy was entered and pancreatic necrosis was found. - Necrosectomy was performed. A double pigtail was reinserted through the AXIOS to decrease risk of - Non-bleeding erosive gastropathy. - No gross lesions in the duodenal bulb, in the first portion of the duodenum and in the second portion of the duodenum.  Laboratory Studies  Reviewed in epic  Imaging Studies  March 2020 CT abdomen pelvis with contrast IMPRESSION: Interval cyst gastrostomy, with near complete decompression of the previous pancreatic pseudocyst. The only residual component extends superiorly from the residual pancreatic tissue through the esophageal hiatus into the lower mediastinum, with the greatest component in lower mediastinum measuring 2.4 cm. Migration of  double-J stent into distal small bowel. Redemonstration of changes of chronic pancreatitis, with residual inflammatory changes adjacent to the pseudocyst site/pancreas, potentially postoperative and/or inflammatory. Associated small lymph nodes. Nonobstructing infraumbilical ventral wall hernia containing sigmoid mesenteric fat. Indeterminate left adrenal nodule again demonstrated. Additional ancillary findings as above.   ASSESSMENT  Ms. Troiano is a 59 y.o. 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  The patient is seen today for evaluation and management of:  1. Pancreatic pseudocyst/cyst   2. Pancreatic necrosis   3. Abdominal pain, unspecified abdominal location   4. Abnormal CT of the abdomen    At this point in time the patient seems to be  clinically stable and improving.  She has made tremendous progress over the course of the last few weeks since we initiated this whole process with her pancreatic necrosis and therapy for it.  At this junction the patient's most recent CAT scan shows improving pancreatitis as well as near complete resolution of her pancreatic walled off necrosis.  This is great news.  With that being said, there is still an area of fluid collection that is in the very distal portion of the esophagus but it is very small and would not be something I would feel comfortable that should be drained it could be considered for aspiration if it remains on further cross-sectional imaging down the road.  She will need to continue to abstain from any alcohol consumption as well as maintain a low-fat diet.  Hopefully we are able to remove the stent in the course the next 1 to 2 weeks to minimize the risk of tissue ingrowth and/or bleeding episodes that may occur or  have been noted with this AXIOS lumen opposing metal stent.  It her intermittent abdominal discomfort is not completely consistent with chronic pancreatitis pain but she may benefit from pancreatic enzyme replacement therapy in the future.  I will defer this to her primary gastroenterologist Dr. Ardis Hughs but would be happy to be of assistance in the future as necessary.  Some data would suggest that patient is on 72,000 units of lipase 3 times daily with meals and one 36,000 unit with each snack may be beneficial for pain in approximately 20 to 30% of individuals who have chronic pancreatitis pain.  With that being said, is not completely clear that the pain she is describing is true pancreatitis pain.  Would like to initiate the patient back on PPI therapy in regards to the possibility of gastritis or discomfort from that but we will do that once we remove her AXIOS stent.  She will be at risk of recurrence of cyst if there is a pancreatic duct disruption but there is no evidence of that on current cross-sectional imaging.  She will maintain antibiotics until we remove her stent and we will send a new prescription for that.  She has a small infraumbilical ventral wall hernia with some fat in it but it is not clear to me that her pain is associated with a ventral hernia.  The risks and benefits of endoscopic evaluation were discussed with the patient; these include but are not limited to the risk of perforation, infection, bleeding, missed lesions, lack of diagnosis, severe illness requiring hospitalization, as well as anesthesia and sedation related illnesses.  The patient is agreeable to proceed.  All patient questions were answered, to the best of my ability, and the patient agrees to the aforementioned plan of action with follow-up as indicated.   PLAN  Continue ciprofloxacin 500 twice daily until stent removal Schedule EGD with stent removal in next 1 to 2 weeks Consider in future pancreatic enzyme  replacement therapy We will likely initiate PPI therapy after AXIOS stent is removed Additional follow-up to be dictated after EGD Patient aware that we are not going to be continuing her opioid therapy for abdominal discomfort moving forward We will plan likely follow-up CT scan in approximately 3 months but will plan on ordering that at time of next endoscopy with stent removal   Orders Placed This Encounter  Procedures  . Ambulatory referral to Gastroenterology    New Prescriptions   CIPROFLOXACIN (CIPRO) 500 MG TABLET    Take  1 tablet (500 mg total) by mouth 2 (two) times daily.   Modified Medications   No medications on file    Planned Follow Up: No follow-ups on file.   Justice Britain, MD [facility] Office # [phone]

## 2018-07-14 NOTE — Progress Notes (Signed)
[place] VISIT   Primary Care Provider Patient, No Pcp Per No address on file None  Patient Profile: Amanda Hahn is a 58 y.o. 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  The patient presents to the [facility] for an evaluation and management of problem(s) noted below:  Problem List 1. Pancreatic pseudocyst/cyst   2. Pancreatic necrosis   3. Abdominal pain, unspecified abdominal location   4. Abnormal CT of the abdomen     History of Present Illness: Due to the COVID-19 Pandemic, this service was provided via telemedicine using Facetime/Telephone. Interactive audio and video telecommunications were attempted between this provider and patient, however failed, due to patient having technical difficulties and thus to provide timely and excellent care, we continued and completed visit with audio only. The patient was located in her car driving back from the supermarket. The provider was located in the office. The patient did consent to this visit and is aware of charges through their insurance. Other persons participating in this telemedicine service were the patient's daughter who was in the car with her. Time spent on visit was 21 minutes.  Overall, the patient has done well since all of this process started.  She has been abstaining from all alcohol consumption for months.  However, the patient still does report recurrent episodes of abdominal discomfort and pain that occur throughout the abdomen and can last minutes to hours at a time.  More recently, they have been less occurring and she has been attributing things to her being more significantly mobile and working.  With it being said the patient does not describe  progressive worsening of pain when she eats or if she fasts.  If she takes time and rests and takes pain medications it can be helpful at times.  Tylenol has not been relieving her symptoms currently.  She was recently prescribed a short course of opioid therapy in effort of trying to get her to this visit today.  Has been taking her blood pressures and watching her sugars closely and she has been doing well.  Patient is able to eat and has been gaining weight.  She feels that she is going to be able to remain abstinent from all alcohol which is very good news.  She does have some mild indigestion and heartburn but overall has been able to tolerate things well being on no significant acid suppression.  She is no longer smoking tobacco.  GI Review of Systems Positive as above including infrequent bloating Negative for dysphagia, odynophagia, change in bowel habits, melena, hematochezia  Review of Systems General: Denies fevers/chills/weight loss HEENT: Denies oral lesions Cardiovascular: Denies chest pain Pulmonary: Denies current shortness of breath/cough Gastroenterological: See HPI Genitourinary: Denies darkened urine Hematological: Denies easy bruising Dermatological: Denies jaundice Psychological: Mood is stable   Medications Current Outpatient Medications  Medication Sig Dispense Refill  . amLODipine (NORVASC) 10 MG tablet Take 1 tablet (10 mg total) by mouth daily. 30 tablet 2  . ciprofloxacin (CIPRO) 500 MG tablet Take 1 tablet (500 mg total) by mouth 2 (two) times daily. 10 tablet 0  . metFORMIN (GLUCOPHAGE) 500 MG tablet Take 1 tablet (500 mg total) by mouth 2 (two) times daily with a meal. 60 tablet 2  . metoprolol tartrate (LOPRESSOR) 25 MG tablet Take 1 tablet (25 mg total) by mouth  2 (two) times daily. 60 tablet 2  . vitamin C (ASCORBIC ACID) 500 MG tablet Take 500 mg by mouth daily.     Current Facility-Administered Medications  Medication Dose Route Frequency Provider Last  Rate Last Dose  . HYDROcodone-acetaminophen (NORCO/VICODIN) 5-325 MG per tablet 1 tablet  1 tablet Oral Q6H PRN Mansouraty, Telford Nab., MD        Allergies No Known Allergies  Histories Past Medical History:  Diagnosis Date  . Anemia   . Diabetes mellitus without complication ([place])    Type II  . History of blood transfusion 2011  . History of pancreatitis   . Hypertension   . Wears glasses    Past Surgical History:  Procedure Laterality Date  . BALLOON DILATION N/A 06/15/2018   Procedure: BALLOON DILATION;  Surgeon: Rush Landmark Telford Nab., MD;  Location: [place];  Service: Gastroenterology;  Laterality: N/A;  . BILIARY STENT PLACEMENT  06/15/2018   Procedure: BILIARY STENT PLACEMENT;  Surgeon: Rush Landmark Telford Nab., MD;  Location: [place];  Service: Gastroenterology;;  . BILIARY STENT PLACEMENT  06/17/2018   Procedure: BILIARY STENT PLACEMENT;  Surgeon: Irving Copas., MD;  Location: [place];  Service: Gastroenterology;;  . BILIARY STENT PLACEMENT  06/22/2018   Procedure: BILIARY STENT PLACEMENT;  Surgeon: Irving Copas., MD;  Location: [place];  Service: Gastroenterology;;  . BIOPSY  06/15/2018   Procedure: BIOPSY;  Surgeon: Irving Copas., MD;  Location: [place];  Service: Gastroenterology;;  . CESAREAN SECTION     x2  . ENDOROTOR N/A 06/22/2018   Procedure: [id number];  Surgeon: Mansouraty, Telford Nab., MD;  Location: [place];  Service: Gastroenterology;  Laterality: N/A;  Necrosectomy  . ESOPHAGOGASTRODUODENOSCOPY     x2  . ESOPHAGOGASTRODUODENOSCOPY (EGD) WITH PROPOFOL N/A 06/15/2018   Procedure: ESOPHAGOGASTRODUODENOSCOPY (EGD) WITH PROPOFOL;  Surgeon: Rush Landmark Telford Nab., MD;  Location: [place];  Service: Gastroenterology;  Laterality: N/A;  . ESOPHAGOGASTRODUODENOSCOPY (EGD) WITH PROPOFOL N/A 06/17/2018   Procedure: ESOPHAGOGASTRODUODENOSCOPY (EGD) WITH PROPOFOL;  Surgeon: Rush Landmark Telford Nab., MD;  Location: [place];  Service: Gastroenterology;  Laterality: N/A;  necosectomy  . ESOPHAGOGASTRODUODENOSCOPY (EGD) WITH PROPOFOL N/A 06/22/2018   Procedure: ESOPHAGOGASTRODUODENOSCOPY (EGD) WITH PROPOFOL;  Surgeon: Rush Landmark Telford Nab., MD;  Location: [place];  Service: Gastroenterology;  Laterality: N/A;  . EUS N/A 06/15/2018   Procedure: UPPER ENDOSCOPIC ULTRASOUND (EUS) RADIAL;  Surgeon: Rush Landmark Telford Nab., MD;  Location: [place];  Service: Gastroenterology;  Laterality: N/A;  AXIOS STENTING  . PANCREATIC STENT PLACEMENT  06/15/2018   Procedure: PANCREATIC STENT PLACEMENT;  Surgeon: Rush Landmark Telford Nab., MD;  Location: [place];  Service: Gastroenterology;;  . [place] Islam REMOVAL  06/17/2018   Procedure: STENT REMOVAL;  Surgeon: Irving Copas., MD;  Location: [place];  Service: Gastroenterology;;  . Lav[place] Islam REMOVAL  06/22/2018   Procedure: STENT REMOVAL;  Surgeon: Irving Copas., MD;  Location: [place];  Service: Gastroenterology;;   Social History   Socioeconomic History  . Marital status: Single    Spouse name: Not on file  . Number of children: Not on file  . Years of education: Not on file  . Highest education level: Not on file  Occupational History  . Not on file  Social Needs  . Financial resource strain: Not on file  . Food insecurity:    Worry: Not on file    Inability: Not on file  . Transportation needs:    Medical: Not on file    Non-medical: Not on file  Tobacco Use  . Smoking status: Former Smoker    Years: 40.00    Last attempt to quit: 05/24/2018    Years since quitting: 0.1  . Smokeless tobacco: Never Used  Substance and Sexual Activity  . Alcohol use: Not Currently  . Drug use: Never  . Sexual activity: Not Currently  Lifestyle  . Physical activity:    Days per week: Not on file    Minutes per session: Not on file  . Stress: Not on file  Relationships  . Social connections:    Talks on phone: Not on file    Gets together:  Not on file    Attends religious service: Not on file    Active member of club or organization: Not on file    Attends meetings of clubs or organizations: Not on file    Relationship status: Not on file  . Intimate partner violence:    Fear of current or ex partner: Not on file    Emotionally abused: Not on file    Physically abused: Not on file    Forced sexual activity: Not on file  Other Topics Concern  . Not on file  Social History Narrative  . Not on file   Family History  Problem Relation Age of Onset  . CAD Mother   . Colon cancer Father   . Esophageal cancer Neg Hx   . Inflammatory bowel disease Neg Hx   . Liver disease Neg Hx   . Pancreatic cancer Neg Hx   . Stomach cancer Neg Hx    I have reviewed her medical, social, and family history in detail and updated the electronic medical record as necessary.    PHYSICAL EXAMINATION  Telehealth visit  REVIEW OF DATA  I reviewed the following data at the time of this encounter:  GI Procedures and Studies  June 22, 2026 EGD - No gross lesions in esophagus. - Pre-existing AXIOS cystogastrostomy stent with double pigtail stent was found. The double pigtail stent was removed. The Cystogastrostomy was entered and pancreatic necrosis was found. - Necrosectomy was performed. A double pigtail was reinserted through the AXIOS to decrease risk of - Non-bleeding erosive gastropathy. - No gross lesions in the duodenal bulb, in the first portion of the duodenum and in the second portion of the duodenum.  Laboratory Studies  Reviewed in epic  Imaging Studies  March 2020 CT abdomen pelvis with contrast IMPRESSION: Interval cyst gastrostomy, with near complete decompression of the previous pancreatic pseudocyst. The only residual component extends superiorly from the residual pancreatic tissue through the esophageal hiatus into the lower mediastinum, with the greatest component in lower mediastinum measuring 2.4 cm. Migration of  double-J stent into distal small bowel. Redemonstration of changes of chronic pancreatitis, with residual inflammatory changes adjacent to the pseudocyst site/pancreas, potentially postoperative and/or inflammatory. Associated small lymph nodes. Nonobstructing infraumbilical ventral wall hernia containing sigmoid mesenteric fat. Indeterminate left adrenal nodule again demonstrated. Additional ancillary findings as above.   ASSESSMENT  Ms. Friedel is a 58 y.o. 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  The patient is seen today for evaluation and management of:  1. Pancreatic pseudocyst/cyst   2. Pancreatic necrosis   3. Abdominal pain, unspecified abdominal location   4. Abnormal CT of the abdomen    At this point in time the patient seems to be  clinically stable and improving.  She has made tremendous progress over the course of the last few weeks since we initiated this whole process with her pancreatic necrosis and therapy for it.  At this junction the patient's most recent CAT scan shows improving pancreatitis as well as near complete resolution of her pancreatic walled off necrosis.  This is great news.  With that being said, there is still an area of fluid collection that is in the very distal portion of the esophagus but it is very small and would not be something I would feel comfortable that should be drained it could be considered for aspiration if it remains on further cross-sectional imaging down the road.  She will need to continue to abstain from any alcohol consumption as well as maintain a low-fat diet.  Hopefully we are able to remove the stent in the course the next 1 to 2 weeks to minimize the risk of tissue ingrowth and/or bleeding episodes that may occur or  have been noted with this AXIOS lumen opposing metal stent.  It her intermittent abdominal discomfort is not completely consistent with chronic pancreatitis pain but she may benefit from pancreatic enzyme replacement therapy in the future.  I will defer this to her primary gastroenterologist Dr. Ardis Hughs but would be happy to be of assistance in the future as necessary.  Some data would suggest that patient is on 72,000 units of lipase 3 times daily with meals and one 36,000 unit with each snack may be beneficial for pain in approximately 20 to 30% of individuals who have chronic pancreatitis pain.  With that being said, is not completely clear that the pain she is describing is true pancreatitis pain.  Would like to initiate the patient back on PPI therapy in regards to the possibility of gastritis or discomfort from that but we will do that once we remove her AXIOS stent.  She will be at risk of recurrence of cyst if there is a pancreatic duct disruption but there is no evidence of that on current cross-sectional imaging.  She will maintain antibiotics until we remove her stent and we will send a new prescription for that.  She has a small infraumbilical ventral wall hernia with some fat in it but it is not clear to me that her pain is associated with a ventral hernia.  The risks and benefits of endoscopic evaluation were discussed with the patient; these include but are not limited to the risk of perforation, infection, bleeding, missed lesions, lack of diagnosis, severe illness requiring hospitalization, as well as anesthesia and sedation related illnesses.  The patient is agreeable to proceed.  All patient questions were answered, to the best of my ability, and the patient agrees to the aforementioned plan of action with follow-up as indicated.   PLAN  Continue ciprofloxacin 500 twice daily until stent removal Schedule EGD with stent removal in next 1 to 2 weeks Consider in future pancreatic enzyme  replacement therapy We will likely initiate PPI therapy after AXIOS stent is removed Additional follow-up to be dictated after EGD Patient aware that we are not going to be continuing her opioid therapy for abdominal discomfort moving forward We will plan likely follow-up CT scan in approximately 3 months but will plan on ordering that at time of next endoscopy with stent removal   Orders Placed This Encounter  Procedures  . Ambulatory referral to Gastroenterology    New Prescriptions   CIPROFLOXACIN (CIPRO) 500 MG TABLET    Take  1 tablet (500 mg total) by mouth 2 (two) times daily.   Modified Medications   No medications on file    Planned Follow Up: No follow-ups on file.   Justice Britain, MD [facility] Office # [phone]

## 2018-07-16 ENCOUNTER — Encounter: Payer: Self-pay | Admitting: Gastroenterology

## 2018-07-16 DIAGNOSIS — R109 Unspecified abdominal pain: Secondary | ICD-10-CM | POA: Insufficient documentation

## 2018-07-16 DIAGNOSIS — K862 Cyst of pancreas: Secondary | ICD-10-CM | POA: Insufficient documentation

## 2018-07-16 DIAGNOSIS — K863 Pseudocyst of pancreas: Principal | ICD-10-CM

## 2018-07-16 DIAGNOSIS — R935 Abnormal findings on diagnostic imaging of other abdominal regions, including retroperitoneum: Secondary | ICD-10-CM | POA: Insufficient documentation

## 2018-07-16 DIAGNOSIS — K8689 Other specified diseases of pancreas: Secondary | ICD-10-CM | POA: Insufficient documentation

## 2018-07-17 ENCOUNTER — Encounter (HOSPITAL_COMMUNITY): Payer: Self-pay | Admitting: *Deleted

## 2018-07-17 NOTE — Progress Notes (Signed)
Pt denies SOB, chest pain, and being under the care of a cardiologist. Pt denies having a stress test, echo and cardiac cath. Pt denies having an EKG within the last year. Pt made aware to stop taking vitamins, fish oil and herbal medications. Do not take any NSAIDs ie: Ibuprofen, Advil, Naproxen (Aleve), Motrin, BC and Goody Powder or any medication containing Aspirin. Pt made aware to not take Metformin on DOS. Pt made aware to check BG every 2 hours prior to arrival to hospital on DOS. Pt made aware to treat a BG < 70 with  4 ounces of apple  juice, wait 15 minutes after intervention to recheck BG, if BG remains < 70, call the Endoscopy Unit to speak with a nurse. Pt verbalized understanding of all pre-op instructions.

## 2018-07-17 NOTE — Progress Notes (Signed)
  Coronavirus Screening  Pt denies that she and her granddaughter, Conley Rolls, experienced the following symptoms:  Cough yes/no: No Fever (>100.36F)  yes/no: No Runny nose yes/no: No Sore throat yes/no: No Difficulty breathing/shortness of breath  yes/no: No  Have you or a family member traveled in the last 14 days and where? yes/no: No   Pt reminded that hospital visitation restrictions are in effect and the importance of the restrictions.

## 2018-07-19 NOTE — Anesthesia Preprocedure Evaluation (Addendum)
Anesthesia Evaluation  Patient identified by MRN, date of birth, ID band Patient awake    Reviewed: Allergy & Precautions, H&P , NPO status , Patient's Chart, lab work & pertinent test results  Airway Mallampati: II  TM Distance: >3 FB Neck ROM: Full    Dental no notable dental hx. (+) Dental Advisory Given, Teeth Intact   Pulmonary neg pulmonary ROS, former smoker,    Pulmonary exam normal breath sounds clear to auscultation       Cardiovascular Exercise Tolerance: Good hypertension, Pt. on medications and Pt. on home beta blockers  Rhythm:Regular Rate:Normal     Neuro/Psych negative neurological ROS  negative psych ROS   GI/Hepatic negative GI ROS, Neg liver ROS,   Endo/Other  diabetes, Type 2, Oral Hypoglycemic Agents  Renal/GU negative Renal ROS  negative genitourinary   Musculoskeletal   Abdominal   Peds  Hematology  (+) Blood dyscrasia, anemia ,   Anesthesia Other Findings   Reproductive/Obstetrics negative OB ROS                            Anesthesia Physical Anesthesia Plan  ASA: II  Anesthesia Plan: General   Post-op Pain Management:    Induction: Intravenous  PONV Risk Score and Plan: 3 and Ondansetron and Dexamethasone  Airway Management Planned: Oral ETT  Additional Equipment:   Intra-op Plan:   Post-operative Plan: Extubation in OR  Informed Consent: I have reviewed the patients History and Physical, chart, labs and discussed the procedure including the risks, benefits and alternatives for the proposed anesthesia with the patient or authorized representative who has indicated his/her understanding and acceptance.     Dental advisory given  Plan Discussed with: CRNA  Anesthesia Plan Comments:         Anesthesia Quick Evaluation

## 2018-07-20 ENCOUNTER — Telehealth: Payer: Self-pay

## 2018-07-20 ENCOUNTER — Ambulatory Visit (HOSPITAL_COMMUNITY)
Admission: RE | Admit: 2018-07-20 | Discharge: 2018-07-20 | Disposition: A | Discharge status: Home / Self Care | Payer: Self-pay | Attending: Gastroenterology | Admitting: Gastroenterology

## 2018-07-20 ENCOUNTER — Encounter (HOSPITAL_COMMUNITY): Payer: Self-pay | Admitting: Certified Registered Nurse Anesthetist

## 2018-07-20 ENCOUNTER — Ambulatory Visit (HOSPITAL_COMMUNITY): Payer: Self-pay | Admitting: Anesthesiology

## 2018-07-20 ENCOUNTER — Other Ambulatory Visit: Payer: Self-pay

## 2018-07-20 ENCOUNTER — Encounter (HOSPITAL_COMMUNITY)
Admission: RE | Disposition: A | Discharge status: Home / Self Care | Payer: Self-pay | Source: Home / Self Care | Attending: Gastroenterology

## 2018-07-20 DIAGNOSIS — K228 Other specified diseases of esophagus: Secondary | ICD-10-CM

## 2018-07-20 DIAGNOSIS — Z87891 Personal history of nicotine dependence: Secondary | ICD-10-CM | POA: Insufficient documentation

## 2018-07-20 DIAGNOSIS — I10 Essential (primary) hypertension: Secondary | ICD-10-CM | POA: Insufficient documentation

## 2018-07-20 DIAGNOSIS — Z4659 Encounter for fitting and adjustment of other gastrointestinal appliance and device: Secondary | ICD-10-CM | POA: Insufficient documentation

## 2018-07-20 DIAGNOSIS — Z7984 Long term (current) use of oral hypoglycemic drugs: Secondary | ICD-10-CM | POA: Insufficient documentation

## 2018-07-20 DIAGNOSIS — R109 Unspecified abdominal pain: Secondary | ICD-10-CM

## 2018-07-20 DIAGNOSIS — R1013 Epigastric pain: Secondary | ICD-10-CM | POA: Insufficient documentation

## 2018-07-20 DIAGNOSIS — E119 Type 2 diabetes mellitus without complications: Secondary | ICD-10-CM | POA: Insufficient documentation

## 2018-07-20 DIAGNOSIS — Z79899 Other long term (current) drug therapy: Secondary | ICD-10-CM | POA: Insufficient documentation

## 2018-07-20 DIAGNOSIS — K863 Pseudocyst of pancreas: Secondary | ICD-10-CM | POA: Insufficient documentation

## 2018-07-20 DIAGNOSIS — K922 Gastrointestinal hemorrhage, unspecified: Secondary | ICD-10-CM

## 2018-07-20 DIAGNOSIS — K8689 Other specified diseases of pancreas: Secondary | ICD-10-CM | POA: Insufficient documentation

## 2018-07-20 DIAGNOSIS — K862 Cyst of pancreas: Secondary | ICD-10-CM

## 2018-07-20 HISTORY — PX: ESOPHAGOGASTRODUODENOSCOPY: SHX5428

## 2018-07-20 HISTORY — PX: STENT REMOVAL: SHX6421

## 2018-07-20 HISTORY — DX: Cyst of pancreas: K86.2

## 2018-07-20 LAB — GLUCOSE, CAPILLARY: Glucose-Capillary: 129 mg/dL — ABNORMAL HIGH (ref 70–99)

## 2018-07-20 SURGERY — EGD (ESOPHAGOGASTRODUODENOSCOPY)
Anesthesia: General

## 2018-07-20 MED ORDER — ONDANSETRON HCL 4 MG/2ML IJ SOLN
INTRAMUSCULAR | Status: DC | PRN
  Administered 2018-07-20: 4 mg via INTRAVENOUS

## 2018-07-20 MED ORDER — DEXAMETHASONE SODIUM PHOSPHATE 10 MG/ML IJ SOLN
INTRAMUSCULAR | Status: DC | PRN
  Administered 2018-07-20: 4 mg via INTRAVENOUS

## 2018-07-20 MED ORDER — PHENYLEPHRINE HCL 10 MG/ML IJ SOLN
INTRAMUSCULAR | Status: DC | PRN
  Administered 2018-07-20: 120 ug via INTRAVENOUS

## 2018-07-20 MED ORDER — LIDOCAINE 2% (20 MG/ML) 5 ML SYRINGE
INTRAMUSCULAR | Status: DC | PRN
  Administered 2018-07-20: 60 mg via INTRAVENOUS

## 2018-07-20 MED ORDER — OMEPRAZOLE 40 MG PO CPDR: 40.0000 mg | DELAYED_RELEASE_CAPSULE | Freq: Two times a day (BID) | ORAL | 3 refills | Status: AC

## 2018-07-20 MED ORDER — FENTANYL CITRATE (PF) 100 MCG/2ML IJ SOLN
INTRAMUSCULAR | Status: DC | PRN
  Administered 2018-07-20: 100 ug via INTRAVENOUS

## 2018-07-20 MED ORDER — PROPOFOL 10 MG/ML IV BOLUS
INTRAVENOUS | Status: DC | PRN
  Administered 2018-07-20: 150 mg via INTRAVENOUS

## 2018-07-20 MED ORDER — LACTATED RINGERS IV SOLN
INTRAVENOUS | Status: AC | PRN
  Administered 2018-07-20: 1000 mL
  Administered 2018-07-20: 1000 mL via INTRAVENOUS

## 2018-07-20 MED ORDER — SODIUM CHLORIDE 0.9 % IV SOLN: INTRAVENOUS | Status: DC

## 2018-07-20 MED ORDER — SUCCINYLCHOLINE CHLORIDE 20 MG/ML IJ SOLN
INTRAMUSCULAR | Status: DC | PRN
  Administered 2018-07-20: 100 mg via INTRAVENOUS

## 2018-07-20 NOTE — Op Note (Signed)
[facility] Patient Name: Amanda Hahn Procedure Date : 07/20/2018 MRN: [id number] Attending MD: Justice Britain , MD Date of Birth: Aug 12, 1960 CSN: [id number] Age: 58 Admit Type: Outpatient Procedure:                Upper GI endoscopy Indications:              Epigastric abdominal pain, Stent removal,                            Pancreatic necrosis, Pancreatic pseudocyst Providers:                Justice Britain, MD, Glori Bickers, RN, Carlyn Reichert, RN, Charolette Child, Technician, Rejeana Brock,                            CRNA Referring MD:              Medicines:                Monitored Anesthesia Care Complications:            No immediate complications. Estimated Blood Loss:     Estimated blood loss was minimal. Procedure:                Pre-Anesthesia Assessment:                           - Prior to the procedure, a History and Physical                            was performed, and patient medications and                            allergies were reviewed. The patient's tolerance of                            previous anesthesia was also reviewed. The risks                            and benefits of the procedure and the sedation                            options and risks were discussed with the patient.                            All questions were answered, and informed consent                            was obtained. Prior Anticoagulants: The patient has                            taken no previous anticoagulant or antiplatelet                            agents. ASA  Grade Assessment: III - A patient with                            severe systemic disease. After reviewing the risks                            and benefits, the patient was deemed in                            satisfactory condition to undergo the procedure.                           After obtaining informed consent, the endoscope was                            passed  under direct vision. Throughout the                            procedure, the patient's blood pressure, pulse, and                            oxygen saturations were monitored continuously. The                            [id number] ([id number]) Olympus therapeutic                            gastroscope was introduced through the mouth, and                            advanced to the second part of duodenum. The upper                            GI endoscopy was accomplished without difficulty.                            The patient tolerated the procedure. Findings:      No gross lesions were noted in the entire esophagus.      The Z-line was irregular.      Hematin (altered blood/coffee-ground-like material) was found in the       gastric body. This lavaged off without evidence of active       oozing/bleeding.      A previously placed AXIOS cystgastrostomy stent was found on the       posterior wall of the stomach. It had migrated into the cystgastrostomy       tract and was embedded. There was evidence of irritation from the stent       having migrated inwards. Removal of a stent was accomplished with a       Raptor grasping device while pulling from the distal aspect of stent and       then inverting it to be removed.      Looking at the region of the cystgastrostomy, a single spot with no       bleeding and stigmata of recent bleeding was found within the cyst  cavity. As this was located within the cyst cavity I did not feel       comfortable with performing thermal therapy to the region, hopefully       with tamponade effect of the cyst fluid/walled off necrosis being better       and the previous tract being in place, there will be no further issues       of irritation and oozing.      No other gross lesions were noted in the entire examined stomach.      No gross lesions were noted in the duodenal bulb, in the first portion       of the duodenum and in the second portion of the  duodenum. Impression:               - No gross lesions in esophagus. Z-line irregular.                           - Hematin (altered blood/coffee-ground-like                            material) in the gastric body. Lavaged without                            evidence of active extravasation.                           - Pre-existing AXIOS cystgastrostomy stent had                            migrated into the cystgastrostomy tract. It was                            removed.                           - A single spot was found within the cyst wall                            cavity, likely a result of irritation from the                            stent having migrated into the tract. I did not                            feel comfortable with thermal therapy into the cyst                            wall cavity and this was not pursued.                           - No other gross lesions in the stomach.                           - No gross lesions in the duodenal bulb, in the  first portion of the duodenum and in the second                            portion of the duodenum. Recommendation:           - The patient will be observed post-procedure,                            until all discharge criteria are met.                           - Discharge patient to home.                           - Patient has a contact number available for                            emergencies. The signs and symptoms of potential                            delayed complications were discussed with the                            patient. Return to normal activities tomorrow.                            Written discharge instructions were provided to the                            patient.                           - Resume previous diet.                           - Continue present medications.                           - Start Omeprazole 40 mg BID for next 53-month then                             may decrease to once daily (Rx sent to pharmacy).                           - Follow up to be scheduled in next 3-4 weeks.                           - If patient develops issues at home with                            fevers/chills/progressive abdominal                           pain/melena/hematemesis/coffee-ground emesis, she  knows to call Charlett Nose MD, call 911 and be                            taken to the nearest ED for further evaluation.                           - The findings and recommendations were discussed                            with the patient.                           - The findings and recommendations were discussed                            with the patient's family. Procedure Code(s):        --- Professional ---                           [phone], Esophagogastroduodenoscopy, flexible,                            transoral; with removal of foreign body(s) Diagnosis Code(s):        --- Professional ---                           K22.8, Other specified diseases of esophagus                           K92.2, Gastrointestinal hemorrhage, unspecified                           Z97.8, Presence of other specified devices                           K31.89, Other diseases of stomach and duodenum                           R10.13, Epigastric pain                           Z46.59, Encounter for fitting and adjustment of                            other gastrointestinal appliance and device                           K86.89, Other specified diseases of pancreas                           K86.3, Pseudocyst of pancreas CPT copyright 2019 American Medical Association. All rights reserved. The codes documented in this report are preliminary and upon coder review may  be revised to meet current compliance requirements. Justice Britain, MD 07/20/2018 8:12:33 AM Number of Addenda: 0

## 2018-07-20 NOTE — Anesthesia Procedure Notes (Signed)
Procedure Name: Intubation Date/Time: 07/20/2018 7:41 AM Performed by: Inda Coke, CRNA Pre-anesthesia Checklist: Patient identified, Emergency Drugs available, Suction available and Patient being monitored Patient Re-evaluated:Patient Re-evaluated prior to induction Oxygen Delivery Method: Circle System Utilized Preoxygenation: Pre-oxygenation with 100% oxygen Induction Type: IV induction and Rapid sequence Laryngoscope Size: Glidescope and 3 Grade View: Grade I Tube type: Oral Tube size: 7.0 mm Number of attempts: 1 Airway Equipment and Method: Stylet and Oral airway Placement Confirmation: ETT inserted through vocal cords under direct vision,  positive ETCO2 and breath sounds checked- equal and bilateral Secured at: 22 cm Tube secured with: Tape Dental Injury: Teeth and Oropharynx as per pre-operative assessment

## 2018-07-20 NOTE — Anesthesia Postprocedure Evaluation (Signed)
Anesthesia Post Note  Patient: Amanda Hahn  Procedure(s) Performed: ESOPHAGOGASTRODUODENOSCOPY (EGD) (N/A ) STENT REMOVAL     Patient location during evaluation: Endoscopy Anesthesia Type: General Level of consciousness: awake and alert Pain management: pain level controlled Vital Signs Assessment: post-procedure vital signs reviewed and stable Respiratory status: spontaneous breathing, nonlabored ventilation and respiratory function stable Cardiovascular status: blood pressure returned to baseline and stable Postop Assessment: no apparent nausea or vomiting Anesthetic complications: no    Last Vitals:  Vitals:   07/20/18 0830 07/20/18 0835  BP: 135/77 128/90  Pulse: 87 90  Resp: (!) 23 (!) 24  Temp:    SpO2: 93% 93%    Last Pain:  Vitals:   07/20/18 0835  TempSrc:   PainSc: 0-No pain                 Dariyah Garduno,W. EDMOND

## 2018-07-20 NOTE — Discharge Instructions (Signed)
YOU HAD AN ENDOSCOPIC PROCEDURE TODAY: Refer to the procedure report and other information in the discharge instructions given to you for any specific questions about what was found during the examination. If this information does not answer your questions, please call [place] office at [phone] to clarify.   YOU SHOULD EXPECT: Some feelings of bloating in the abdomen. Passage of more gas than usual. Walking can help get rid of the air that was put into your GI tract during the procedure and reduce the bloating. If you had a lower endoscopy (such as a colonoscopy or flexible sigmoidoscopy) you may notice spotting of blood in your stool or on the toilet paper. Some abdominal soreness may be present for a day or two, also.  DIET: Your first meal following the procedure should be a light meal and then it is ok to progress to your normal diet. A half-sandwich or bowl of soup is an example of a good first meal. Heavy or fried foods are harder to digest and may make you feel nauseous or bloated. Drink plenty of fluids but you should avoid alcoholic beverages for 24 hours. If you had a esophageal dilation, please see attached instructions for diet.    ACTIVITY: Your care partner should take you home directly after the procedure. You should plan to take it easy, moving slowly for the rest of the day. You can resume normal activity the day after the procedure however YOU SHOULD NOT DRIVE, use power tools, machinery or perform tasks that involve climbing or major physical exertion for 24 hours (because of the sedation medicines used during the test).   SYMPTOMS TO REPORT IMMEDIATELY: A gastroenterologist can be reached at any hour. Please call [phone]  for any of the following symptoms:   Following upper endoscopy (EGD, EUS, ERCP, esophageal dilation) Vomiting of blood or coffee ground material  New, significant abdominal pain  New, significant chest pain or pain under the shoulder blades  Painful or  persistently difficult swallowing  New shortness of breath  Black, tarry-looking or red, bloody stools  FOLLOW UP:  If any biopsies were taken you will be contacted by phone or by letter within the next 1-3 weeks. Call [phone]  if you have not heard about the biopsies in 3 weeks.  Please also call with any specific questions about appointments or follow up tests.  

## 2018-07-20 NOTE — Interval H&P Note (Signed)
History and Physical Interval Note:  07/20/2018 7:10 AM  Amanda Hahn  has presented today for surgery, with the diagnosis of Pancreatic Necrosis, Pancreatic Pseudocyst,Abd pain.  The various methods of treatment have been discussed with the patient and family. After consideration of risks, benefits and other options for treatment, the patient has consented to  Procedure(s) with comments: ESOPHAGOGASTRODUODENOSCOPY (EGD) (N/A) - possible stent removal as a surgical intervention.  The patient's history has been reviewed, patient examined, no change in status, stable for surgery.  I have reviewed the patient's chart and labs.  Questions were answered to the patient's satisfaction.     [employer]

## 2018-07-20 NOTE — Transfer of Care (Signed)
Immediate Anesthesia Transfer of Care Note  Patient: Amanda Hahn  Procedure(s) Performed: ESOPHAGOGASTRODUODENOSCOPY (EGD) (N/A ) STENT REMOVAL  Patient Location: Endoscopy Unit  Anesthesia Type:General  Level of Consciousness: awake, alert  and oriented  Airway & Oxygen Therapy: Patient Spontanous Breathing  Post-op Assessment: Report given to RN and Post -op Vital signs reviewed and stable  Post vital signs: Reviewed and stable  Last Vitals:  Vitals Value Taken Time  BP    Temp    Pulse    Resp    SpO2      Last Pain:  Vitals:   07/20/18 0651  TempSrc: Oral  PainSc: 0-No pain         Complications: No apparent anesthesia complications

## 2018-07-20 NOTE — Telephone Encounter (Signed)
08/18/18 at 845 am telehealth appt with Dr Ardis Hughs.  Will call pt tomorrow and get an update and make her aware of appt.

## 2018-07-20 NOTE — Telephone Encounter (Signed)
-----   Message from Irving Copas., MD sent at 07/20/2018  8:48 AM EDT ----- Doyne Keel,  Stent removed. It had migrated into the cyst cavity a bit and caused some irritation, after some finagling, I was able to get it out.  Will have to monitor for signs of bleeding but I think low risk overall.  I started her on PPI therapy. Delene Morais, I would get a follow up with Linna Hoff in 3-5 weeks for telehealth for follow up post procedures. Raenah Murley, could you call the patient as well tomorrow or Wednesday to check in on her? Thanks. Chester Holstein

## 2018-07-21 ENCOUNTER — Telehealth: Payer: Self-pay

## 2018-07-21 NOTE — Telephone Encounter (Signed)
-----   Message from Timothy Lasso, RN sent at 07/20/2018 11:45 AM EDT ----- Get pt update and notify her of 08/18/18 845 am appt with Dr Ardis Hughs.

## 2018-07-21 NOTE — Telephone Encounter (Signed)
The pt was advised of the appt and we discussed Zoom and she assures me that she can use the app for the appt. She also states that she is feeling well and will call if things change.

## 2018-07-21 NOTE — Telephone Encounter (Signed)
Left message on machine to call back  

## 2018-07-22 ENCOUNTER — Telehealth: Payer: Self-pay | Admitting: Gastroenterology

## 2018-07-22 DIAGNOSIS — R109 Unspecified abdominal pain: Secondary | ICD-10-CM

## 2018-07-22 DIAGNOSIS — K8689 Other specified diseases of pancreas: Secondary | ICD-10-CM

## 2018-07-22 NOTE — Telephone Encounter (Signed)
Pt states that since her procedure she has been experiencing lower back pain. She would like some advise.

## 2018-07-22 NOTE — Telephone Encounter (Signed)
Lower back and abdomen with movement and trying to have a BM.  Pain started last night about 10:30 am.  Felt fine all day yesterday.  Took 2 tylenol without relief.  Not really that painful just sore with movement.  Wants to make sure that is nothing to be concerned about.  No fever and states she feels fine otherwise.

## 2018-07-23 NOTE — Telephone Encounter (Signed)
Difficult to discern overall. However, if patient continues to have issues, then I would pursue her coming in to get labs including Amylase/Lipase/HFP/CBC. She can get an abodminal KUB 2-view as well. If these are unremarkable then further workup/managment should be dictated by her PCP. Thank you. GM

## 2018-07-23 NOTE — Telephone Encounter (Signed)
Pt aware will come in at her convenience for labs and xray

## 2018-08-13 ENCOUNTER — Telehealth: Payer: Self-pay | Admitting: Gastroenterology

## 2018-08-13 NOTE — Telephone Encounter (Signed)
Pt returned your call.  

## 2018-08-13 NOTE — Telephone Encounter (Signed)
Spoke to patient to abstract chart

## 2018-08-18 ENCOUNTER — Ambulatory Visit (INDEPENDENT_AMBULATORY_CARE_PROVIDER_SITE_OTHER): Payer: Self-pay | Admitting: Gastroenterology

## 2018-08-18 ENCOUNTER — Other Ambulatory Visit: Payer: Self-pay

## 2018-08-18 ENCOUNTER — Encounter: Payer: Self-pay | Admitting: Gastroenterology

## 2018-08-18 VITALS — Ht 62.0 in | Wt 149.0 lb

## 2018-08-18 DIAGNOSIS — K862 Cyst of pancreas: Secondary | ICD-10-CM

## 2018-08-18 DIAGNOSIS — K863 Pseudocyst of pancreas: Secondary | ICD-10-CM

## 2018-08-18 DIAGNOSIS — Z800 Family history of malignant neoplasm of digestive organs: Secondary | ICD-10-CM

## 2018-08-18 MED ORDER — PEG 3350-KCL-NA BICARB-NACL 420 G PO SOLR: 4000.0000 mL | ORAL | 0 refills | Status: DC

## 2018-08-18 NOTE — Progress Notes (Signed)
Review of pertinent gastrointestinal problems: 1. Severe acute pancreatitis January 2020, attributed to alcohol abuse however she does have gallstones in her gallbladder on imaging (CT 04/2018).  She was also never an alcoholic and only had 3 or 4 drinks prior to this bout of pancreatitis.  Progressed to large pancreatic fluid collection, eventual pancreatic symptomatic pseudocyst measuring 20 cm.  Also "multiloculated mass at the esophageal hiatus" favored to be extension of pseudocyst.  Multiple endoscopic procedures between March and early April 2020 by Dr. Rush Landmark including placement of AXIOS stent and repeated necrosectomies.  July 20, 2018 the AXIOS stent was removed. 2. Gallstones in GB (CT)  This service was provided via virtual visit.  I attempted audio and visual however the patient did not connect using video and so audio sufficed.  The patient was located at home.  I was located in my office.  The patient did consent to this virtual visit and is aware of possible charges through their insurance for this visit.  The patient is an established patient.  My certified medical assistant, Grace Bushy, contributed to this visit by contacting the patient by phone 1 or 2 business days prior to the appointment and also followed up on the recommendations I made after the visit.  Time spent on virtual visit: 18   HPI: This is a very pleasant 58 year old woman whom I last saw in January she was hospitalized with severe acute pancreatitis which led to a large peripancreatic fluid collection, eventually coalesced into a pseudocyst causing gastric outlet obstruction.  1 of my partners drained the cyst and cleared out pancreatic necrosis over several staged procedures.  See above.  Since the [facility] stent was removed she has had no trouble eating.  She's gaining weight;  Went from 120 to 150s recently.]  I quizzed her again about her alcohol history.  She has never had alcohol abuse that I can tell.  In  fact she only had 3 drinks prior to her episode of severe pancreatitis.    Her father had colon cancer. He was diagnosed in his 87s.  She has never had colon cancer screening that she is aware of.  She does not have any GI bleeding or bowel changes  Chief complaint is pancreatic pseudocyst, family history of colon cancer  ROS: complete GI ROS as described in HPI, all other review negative.  Constitutional:  No unintentional weight loss   Past Medical History:  Diagnosis Date  . Anemia   . Diabetes mellitus without complication ([place])    Type II  . History of blood transfusion 2011  . History of pancreatitis   . Hypertension   . Pancreatic cyst   . Wears glasses     Current Outpatient Medications  Medication Sig Dispense Refill  . amLODipine (NORVASC) 10 MG tablet Take 1 tablet (10 mg total) by mouth daily. 30 tablet 2  . metoprolol tartrate (LOPRESSOR) 25 MG tablet Take 1 tablet (25 mg total) by mouth 2 (two) times daily. 60 tablet 2  . omeprazole (PRILOSEC) 40 MG capsule Take 1 capsule (40 mg total) by mouth 2 (two) times daily. Take 30 minutes before breakfast and 30 minutes before dinner. 30 capsule 3  . vitamin C (ASCORBIC ACID) 500 MG tablet Take 500 mg by mouth daily.    . metFORMIN (GLUCOPHAGE) 500 MG tablet Take 1 tablet (500 mg total) by mouth 2 (two) times daily with a meal. 60 tablet 2   Current Facility-Administered Medications  Medication Dose Route Frequency  Provider Last Rate Last Dose  . HYDROcodone-acetaminophen (NORCO/VICODIN) 5-325 MG per tablet 1 tablet  1 tablet Oral Q6H PRN Mansouraty, Telford Nab., MD        Allergies as of 08/18/2018  . (No Known Allergies)    Family History  Problem Relation Age of Onset  . CAD Mother   . Colon cancer Father   . Esophageal cancer Neg Hx   . Inflammatory bowel disease Neg Hx   . Liver disease Neg Hx   . Pancreatic cancer Neg Hx   . Stomach cancer Neg Hx     Social History   Socioeconomic History  . Marital  status: Single    Spouse name: Not on file  . Number of children: Not on file  . Years of education: Not on file  . Highest education level: Not on file  Occupational History  . Not on file  Social Needs  . Financial resource strain: Not on file  . Food insecurity:    Worry: Not on file    Inability: Not on file  . Transportation needs:    Medical: Not on file    Non-medical: Not on file  Tobacco Use  . Smoking status: Former Smoker    Years: 40.00    Last attempt to quit: 05/24/2018    Years since quitting: 0.2  . Smokeless tobacco: Never Used  Substance and Sexual Activity  . Alcohol use: Not Currently  . Drug use: Never  . Sexual activity: Not Currently  Lifestyle  . Physical activity:    Days per week: Not on file    Minutes per session: Not on file  . Stress: Not on file  Relationships  . Social connections:    Talks on phone: Not on file    Gets together: Not on file    Attends religious service: Not on file    Active member of club or organization: Not on file    Attends meetings of clubs or organizations: Not on file    Relationship status: Not on file  . Intimate partner violence:    Fear of current or ex partner: Not on file    Emotionally abused: Not on file    Physically abused: Not on file    Forced sexual activity: Not on file  Other Topics Concern  . Not on file  Social History Narrative  . Not on file     Physical Exam: Unable to perform because this was a "telemed visit" due to current Covid-19 pandemic  Assessment and plan: 58 y.o. female with pancreatic pseudocyst, family history of colon cancer  First, she is doing much better since cyst gastrostomy, necrosectomies in March 2020 with Dr. Rush Landmark.  The axios stent was removed about a month ago and since then she has felt absolutely fine.  She has been gaining weight, has probably put on about 30 pounds.  She has no trouble with nausea or vomiting fevers or chills.  She has gallstones in  her gallbladder on CAT scan.  She does not have a very impressive alcohol abuse history and I actually think that probably her severe pancreatitis was related to gallstones rather than alcohol intake.  We will refer her to a general surgeon to consider elective cholecystectomy  Her father was diagnosed with colon cancer in his 53s, survived it.  She has never had colon cancer screening.  We will arrange for colonoscopy at her soonest convenience  Please see the "Patient Instructions" section for addition details  about the plan.  Owens Loffler, MD [facility] 08/18/2018, 12:40 PM

## 2018-08-18 NOTE — Patient Instructions (Addendum)
We will refer her to [facility] surgery to consider elective cholecystectomy for cholelithiasis, recent severe acute pancreatitis.  We will arrange colonoscopy ([place]) for elevated risk colon cancer screening due to family history of colon cancer (father in his 61s).  My first available [facility] appt.  You have been scheduled for a colonoscopy. Please follow written instructions given to you at your visit today.  Please pick up your prep supplies at the pharmacy within the next 1-3 days. If you use inhalers (even only as needed), please bring them with you on the day of your procedure. Your physician has requested that you go to www.startemmi.com and enter the access code given to you at your visit today. This web site gives a general overview about your procedure. However, you should still follow specific instructions given to you by our office regarding your preparation for the procedure.

## 2018-08-30 ENCOUNTER — Telehealth: Payer: Self-pay | Admitting: *Deleted

## 2018-08-30 NOTE — Telephone Encounter (Signed)
LMOM that we will be calling tomorrow to go over covid screening questions 

## 2018-08-31 ENCOUNTER — Telehealth: Payer: Self-pay | Admitting: *Deleted

## 2018-08-31 NOTE — Telephone Encounter (Signed)
Covid-19 travel screening questions  Have you traveled in the last 14 days?no If yes where?  Do you now or have you had a fever in the last 14 days?no  Do you have any respiratory symptoms of shortness of breath or cough now or in the last 14 days?no  Do you have a medical history of Congestive Heart Failure?  Do you have a medical history of lung disease?  Do you have any family members or close contacts with diagnosed or suspected Covid-19?no  Pt made aware of care partner policy and will bring a mask with her. SM

## 2018-09-01 ENCOUNTER — Encounter: Payer: Self-pay | Admitting: Gastroenterology

## 2018-09-01 ENCOUNTER — Other Ambulatory Visit: Payer: Self-pay

## 2018-09-01 ENCOUNTER — Ambulatory Visit (AMBULATORY_SURGERY_CENTER): Payer: Self-pay | Admitting: Gastroenterology

## 2018-09-01 VITALS — BP 133/76 | HR 72 | Temp 98.4°F | Resp 11 | Ht 62.0 in | Wt 149.0 lb

## 2018-09-01 DIAGNOSIS — Z1211 Encounter for screening for malignant neoplasm of colon: Secondary | ICD-10-CM

## 2018-09-01 DIAGNOSIS — K863 Pseudocyst of pancreas: Secondary | ICD-10-CM

## 2018-09-01 DIAGNOSIS — D129 Benign neoplasm of anus and anal canal: Secondary | ICD-10-CM

## 2018-09-01 DIAGNOSIS — D128 Benign neoplasm of rectum: Secondary | ICD-10-CM

## 2018-09-01 DIAGNOSIS — Z800 Family history of malignant neoplasm of digestive organs: Secondary | ICD-10-CM

## 2018-09-01 MED ORDER — SODIUM CHLORIDE 0.9 % IV SOLN: 500.0000 mL | Freq: Once | INTRAVENOUS | Status: DC

## 2018-09-01 NOTE — Progress Notes (Signed)
Pt's states no medical or surgical changes since previsit or office visit. Riki Sheer LPN Temps and vital signs.

## 2018-09-01 NOTE — Progress Notes (Signed)
Report to PACU, RN, vss, BBS= Clear.  

## 2018-09-01 NOTE — Progress Notes (Signed)
Called to room to assist during endoscopic procedure.  Patient ID and intended procedure confirmed with present staff. Received instructions for my participation in the procedure from the performing physician.  

## 2018-09-01 NOTE — Patient Instructions (Signed)
   Information on polyps and diverticulosis given to you today  Await pathology results on polyp removed   YOU HAD AN ENDOSCOPIC PROCEDURE TODAY AT [facility]:   Refer to the procedure report that was given to you for any specific questions about what was found during the examination.  If the procedure report does not answer your questions, please call your gastroenterologist to clarify.  If you requested that your care partner not be given the details of your procedure findings, then the procedure report has been included in a sealed envelope for you to review at your convenience later.  YOU SHOULD EXPECT: Some feelings of bloating in the abdomen. Passage of more gas than usual.  Walking can help get rid of the air that was put into your GI tract during the procedure and reduce the bloating. If you had a lower endoscopy (such as a colonoscopy or flexible sigmoidoscopy) you may notice spotting of blood in your stool or on the toilet paper. If you underwent a bowel prep for your procedure, you may not have a normal bowel movement for a few days.  Please Note:  You might notice some irritation and congestion in your nose or some drainage.  This is from the oxygen used during your procedure.  There is no need for concern and it should clear up in a day or so.  SYMPTOMS TO REPORT IMMEDIATELY:   Following lower endoscopy (colonoscopy or flexible sigmoidoscopy):  Excessive amounts of blood in the stool  Significant tenderness or worsening of abdominal pains  Swelling of the abdomen that is new, acute  Fever of 100F or higher    For urgent or emergent issues, a gastroenterologist can be reached at any hour by calling [phone].   DIET:  We do recommend a small meal at first, but then you may proceed to your regular diet.  Drink plenty of fluids but you should avoid alcoholic beverages for 24 hours.  ACTIVITY:  You should plan to take it easy for the rest of today and you  should NOT DRIVE or use heavy machinery until tomorrow (because of the sedation medicines used during the test).    FOLLOW UP: Our staff will call the number listed on your records 48-72 hours following your procedure to check on you and address any questions or concerns that you may have regarding the information given to you following your procedure. If we do not reach you, we will leave a message.  We will attempt to reach you two times.  During this call, we will ask if you have developed any symptoms of COVID 19. If you develop any symptoms (for example fever, flu-like symptoms, shortness of breath, cough etc.) before then, please call [phone].  If any biopsies were taken you will be contacted by phone or by letter within the next 1-3 weeks.  Please call us at [phone] if you have not heard about the biopsies in 3 weeks.    SIGNATURES/CONFIDENTIALITY: You and/or your care partner have signed paperwork which will be entered into your electronic medical record.  These signatures attest to the fact that that the information above on your After Visit Summary has been reviewed and is understood.  Full responsibility of the confidentiality of this discharge information lies with you and/or your care-partner.

## 2018-09-01 NOTE — Op Note (Signed)
[facility] Patient Name: Amanda Hahn Procedure Date: 09/01/2018 1:44 PM MRN: [id number] Endoscopist: Milus Banister , MD Age: 58 Referring MD:  Date of Birth: [birth year] Gender: Female Account #: [id number] Procedure:                Colonoscopy Indications:              Screening in patient at increased risk: Family                            history of 1st-degree relative with colorectal                            cancer before age 28 years (father) Medicines:                Monitored Anesthesia Care Procedure:                Pre-Anesthesia Assessment:                           - Prior to the procedure, a History and Physical                            was performed, and patient medications and                            allergies were reviewed. The patient's tolerance of                            previous anesthesia was also reviewed. The risks                            and benefits of the procedure and the sedation                            options and risks were discussed with the patient.                            All questions were answered, and informed consent                            was obtained. Prior Anticoagulants: The patient has                            taken no previous anticoagulant or antiplatelet                            agents. ASA Grade Assessment: II - A patient with                            mild systemic disease. After reviewing the risks                            and benefits, the patient was deemed in  satisfactory condition to undergo the procedure.                           After obtaining informed consent, the colonoscope                            was passed under direct vision. Throughout the                            procedure, the patient's blood pressure, pulse, and                            oxygen saturations were monitored continuously. The                            Colonoscope was introduced  through the anus and                            advanced to the the cecum, identified by                            appendiceal orifice and ileocecal valve. The                            colonoscopy was performed without difficulty. The                            patient tolerated the procedure well. The quality                            of the bowel preparation was good. The ileocecal                            valve, appendiceal orifice, and rectum were                            photographed. Scope In: 1:52:51 PM Scope Out: 2:03:35 PM Scope Withdrawal Time: 0 hours 8 minutes 25 seconds  Total Procedure Duration: 0 hours 10 minutes 44 seconds  Findings:                 A 2 mm polyp was found in the rectum. The polyp was                            sessile. The polyp was removed with a cold snare.                            Resection and retrieval were complete.                           Multiple small and large-mouthed diverticula were                            found in the entire colon.  The exam was otherwise without abnormality on                            direct and retroflexion views. Complications:            No immediate complications. Estimated blood loss:                            None. Estimated Blood Loss:     Estimated blood loss: none. Impression:               - One 2 mm polyp in the rectum, removed with a cold                            snare. Resected and retrieved.                           - Diverticulosis in the entire examined colon.                           - The examination was otherwise normal on direct                            and retroflexion views. Recommendation:           - Patient has a contact number available for                            emergencies. The signs and symptoms of potential                            delayed complications were discussed with the                            patient. Return to normal activities  tomorrow.                            Written discharge instructions were provided to the                            patient.                           - Resume previous diet.                           - Continue present medications.                           You will receive a letter within 2-3 weeks with the                            pathology results and my final recommendations.                           If the polyp(s) is proven to be 'pre-cancerous' on  pathology, you will need repeat colonoscopy in 5                            years. Milus Banister, MD 09/01/2018 2:06:14 PM This report has been signed electronically.

## 2018-09-03 ENCOUNTER — Telehealth: Payer: Self-pay

## 2018-09-03 NOTE — Telephone Encounter (Signed)
  Follow up Call-  Call back number 09/01/2018  Post procedure Call Back phone  # [phone]  Permission to leave phone message Yes  Some recent data might be hidden     Patient questions:  Do you have a fever, pain , or abdominal swelling? Yes, patient is still feeling some gas pain. Says she is passing and getting relief though. She stated that she was doing better and would call if it got worse. Pain Score  3*  Have you tolerated food without any problems? Yes.    Have you been able to return to your normal activities? Yes.    Do you have any questions about your discharge instructions: Diet   No. Medications  No. Follow up visit  No.  Do you have questions or concerns about your Care? No.  Actions: * If pain score is 4 or above: No action needed, pain <4.   1. Have you developed a fever since your procedure? No  2.   Have you had an respiratory symptoms (SOB or cough) since your procedure? No.  3.   Have you tested positive for COVID 19 since your procedure No  3.   Have you had any family members/close contacts diagnosed with the COVID 19 since your procedure?  No   If any of these questions are a yes, please inquire if patient has been seen by family doctor and route this note to Joylene John, Therapist, sports.

## 2018-09-04 ENCOUNTER — Encounter: Payer: Self-pay | Admitting: Gastroenterology

## 2020-03-11 IMAGING — CT CT ABD-PELV W/ CM
2 of 5 series · 15 of 46 positions shown, 17 images · IV contrast (ISOVUE)
Comparison: April 23, 2018

Addendum:
CLINICAL DATA: Patient was recently diagnosed with pancreatitis
April 23, 2018. Continue epigastric pain.

EXAM:
CT ABDOMEN AND PELVIS WITH CONTRAST
TECHNIQUE: Multidetector CT imaging of the abdomen and pelvis was performed
using the standard protocol following bolus administration of
intravenous contrast.
CONTRAST:  100mL ELDQ1P-MOO IOPAMIDOL (ELDQ1P-MOO) INJECTION 61%

[Series 2: axial st · axial · 0.77mm/px · z∈[-490,-80]mm · 12 of 98 slices shown, 14 images]
[im 8/98  soft-tissue]
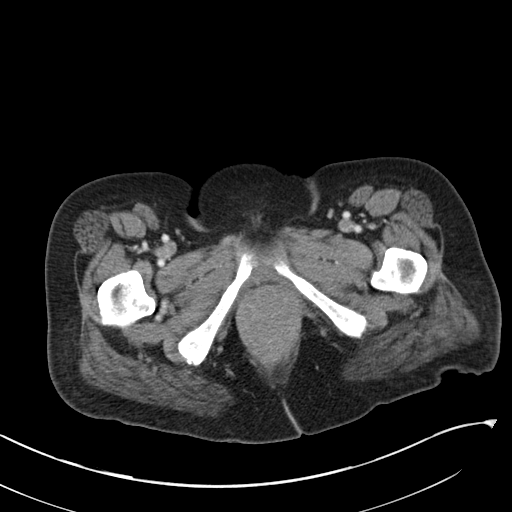
[im 8/98  bone]
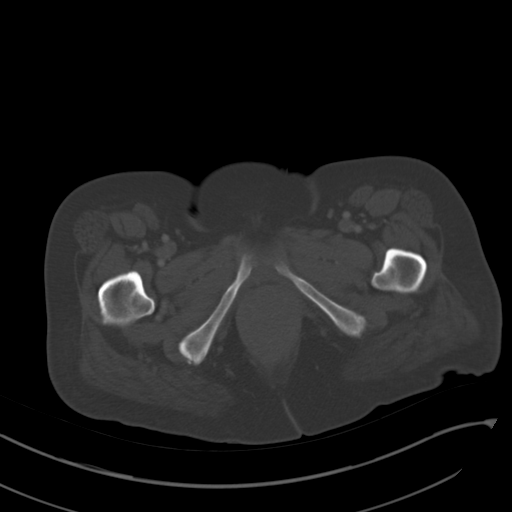
[im 15/98  soft-tissue]
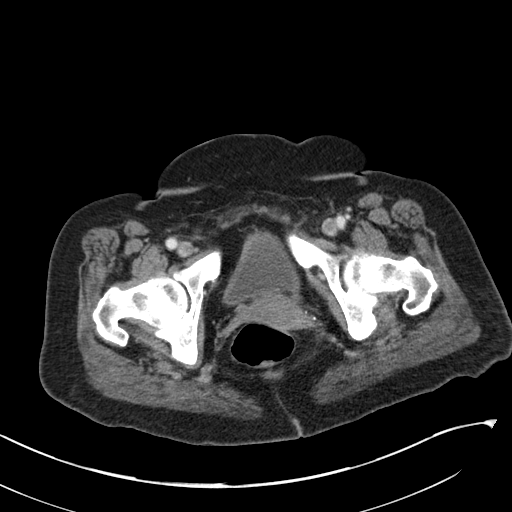
[im 23/98  soft-tissue]
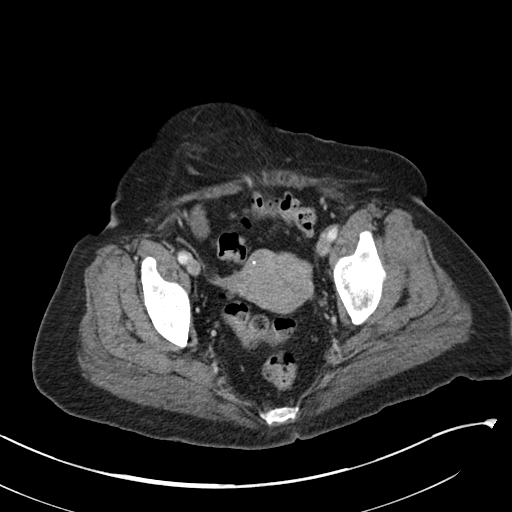
[im 30/98  soft-tissue]
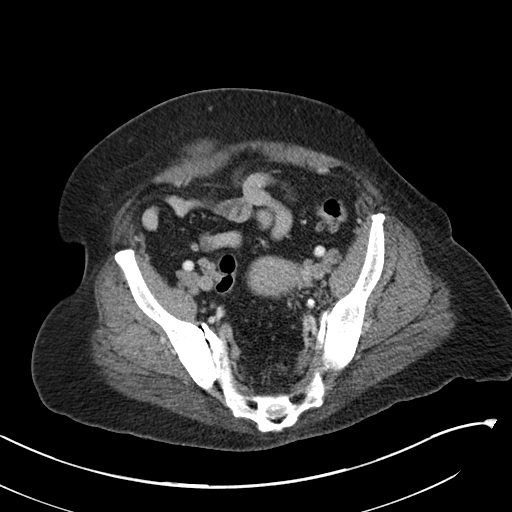
[im 38/98  soft-tissue]
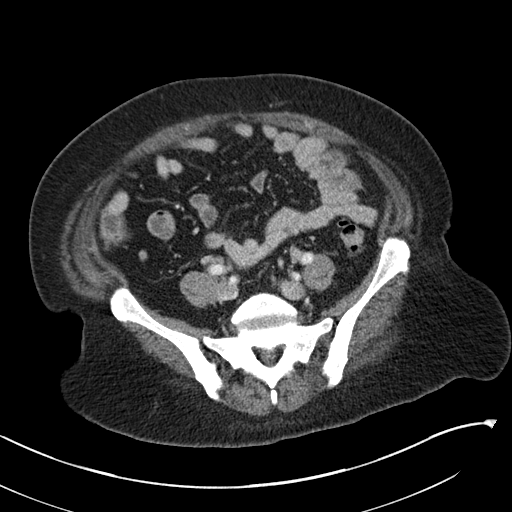
[im 45/98  soft-tissue]
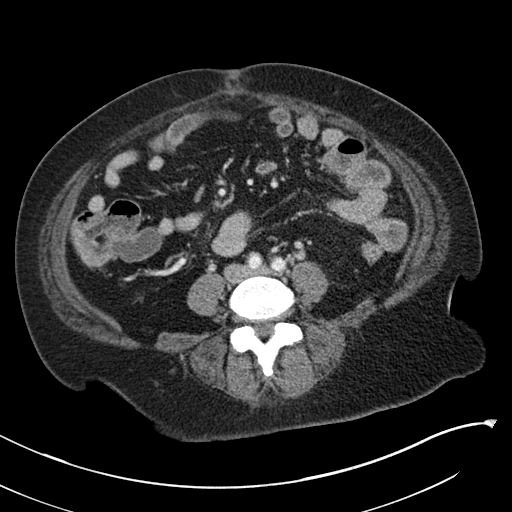
[im 53/98  soft-tissue]
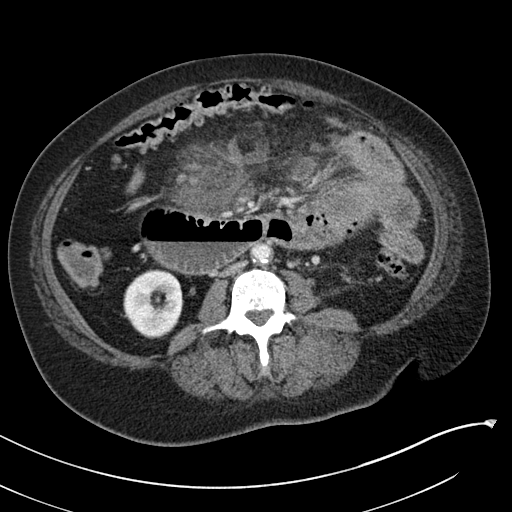
[im 60/98  soft-tissue]
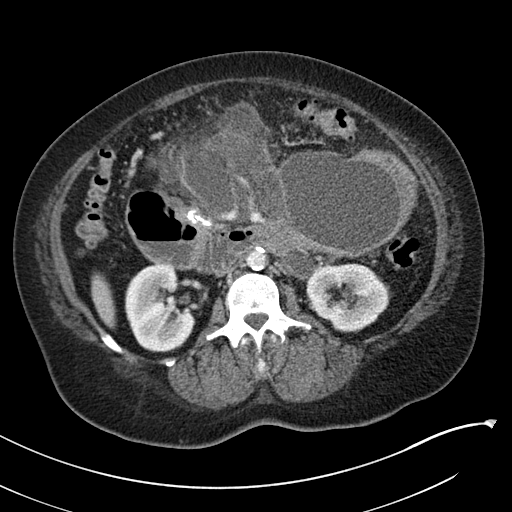
[im 68/98  soft-tissue]
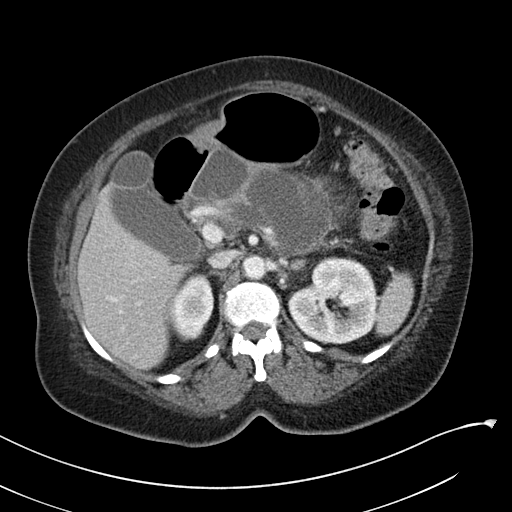
[im 68/98  bone]
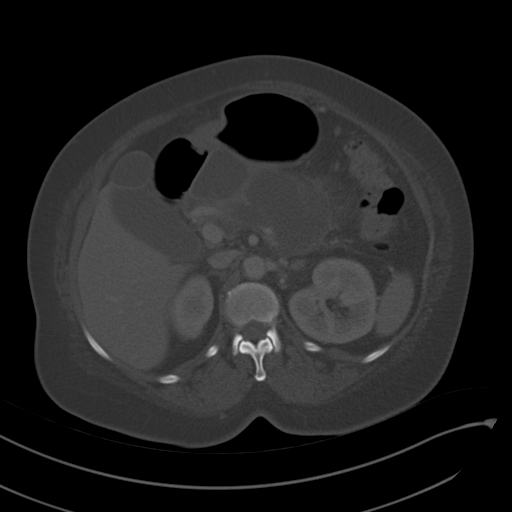
[im 75/98  soft-tissue]
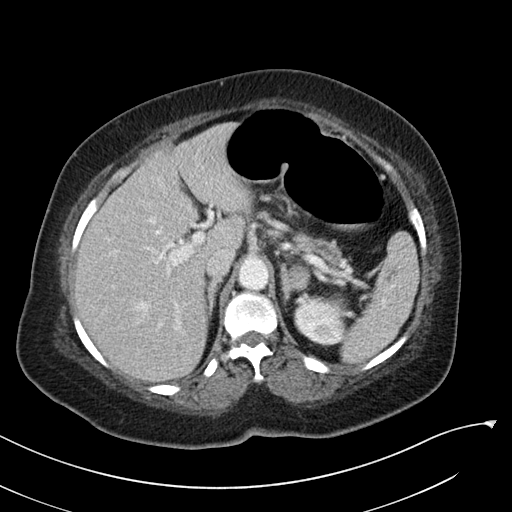
[im 83/98  soft-tissue]
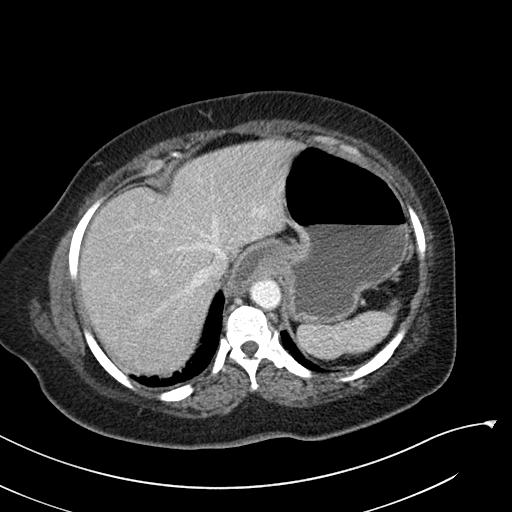
[im 90/98  soft-tissue]
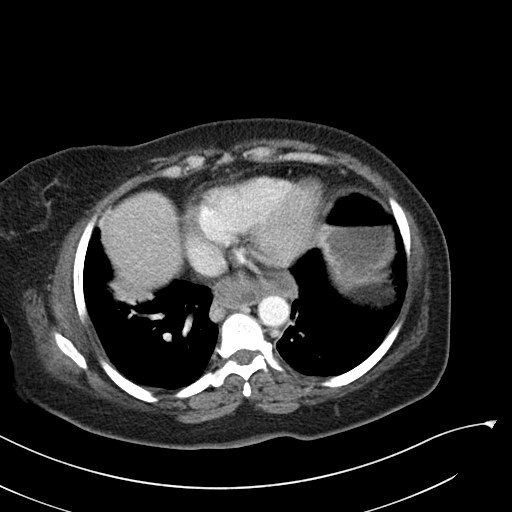

[Series 5: coronal st · coronal · 0.80mm/px · 3 of 105 slices shown]
[im 35/105  soft-tissue]
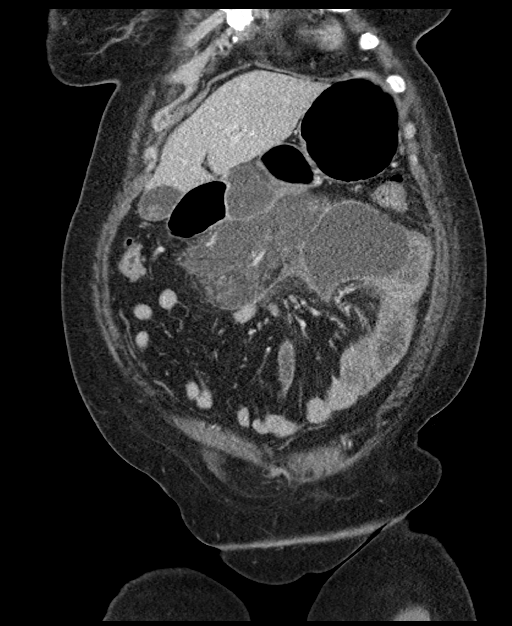
[im 47/105  soft-tissue]
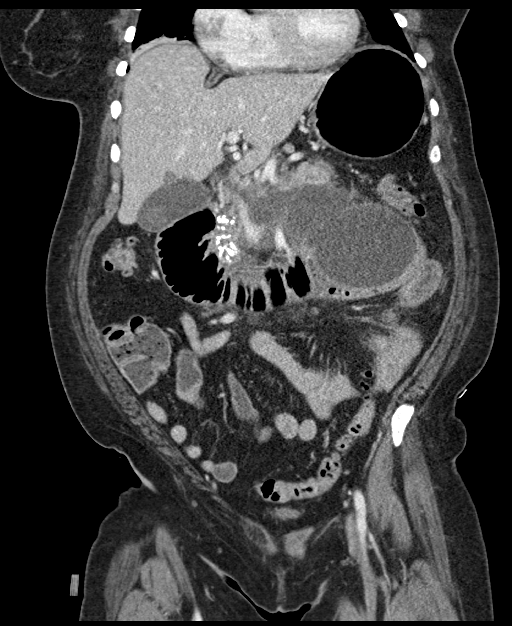
[im 58/105  soft-tissue]
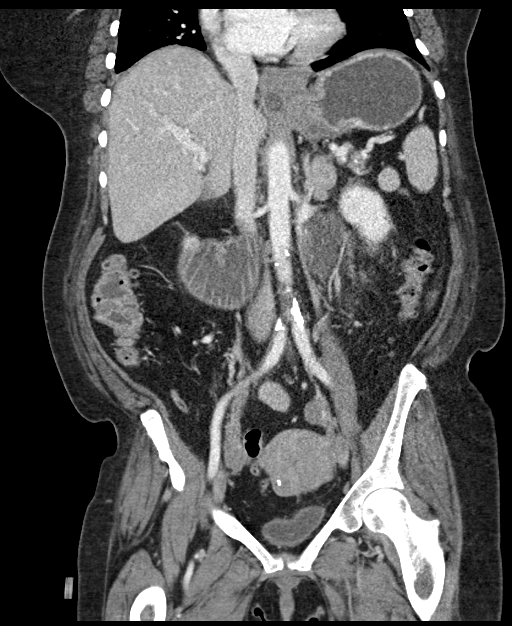

[15 of 46 positions shown; findings below may reference images not displayed]

FINDINGS: Lower chest: Multiloculated mass is identified at the esophageal
hiatus. This is unchanged compared prior exam. Favor pancreatic
pseudocyst. Mild atelectasis of right lung base is noted. The heart
size is enlarged.

Hepatobiliary: There is a small cyst at the gallbladder fossa
unchanged compared prior exam. Fatty infiltration of liver is noted.
The gallbladder is normal. The biliary tree is normal.

Pancreas: There is interval developed large pancreatic pseudocyst
inferior to the pancreas measuring at least 20.2 x 10 cm.
Calcifications are again identified in the pancreatic head.

Spleen: There is a cyst in the spleen unchanged. The spleen is
otherwise unremarkable.

Adrenals/Urinary Tract: 2.2 cm mass in the left adrenal gland is
unchanged. The right adrenal gland is normal. The bilateral kidneys
demonstrate no hydronephrosis. There are small simple cysts in both
kidneys. The bladder is partial decompressed without abnormality.

Stomach/Bowel: There is dilatation of the duodenum likely due to
focal ileus as resolve adjacent pancreatitis and pancreatic
pseudocyst. The mid to distal small bowel loops are normal in
caliber. There is diverticulosis of colon without diverticulitis.
The appendix is normal. Stomach is stable.

Vascular/Lymphatic: Aortic atherosclerosis. No enlarged abdominal or
pelvic lymph nodes.

Reproductive: Partial calcified uterine fibroid is noted. The adnexa
are normal.

Other: None.

Musculoskeletal: No acute abnormality is noted.
IMPRESSION: Interval developed large pancreatic pseudocyst inferior to the
pancreas measuring 20.2 x 10 cm. The previously noted complicated
cystic mass at the esophageal hiatus is unchanged, favor pancreatic
pseudocyst.

ADDENDUM:
Patient has a somewhat firm palpable mass located over the mid
pelvic region. This correlates with an anterior abdominal wall
hernia containing fat and fluid located at the level of the mid to
lower pelvis. There is a small defect in the [place] Ox[place] between the
rectus muscles. The hernia measures a maximum of 4.6 cm

*** End of Addendum ***

## 2022-03-15 DIAGNOSIS — Z419 Encounter for procedure for purposes other than remedying health state, unspecified: Secondary | ICD-10-CM | POA: Diagnosis not present

## 2022-04-15 DIAGNOSIS — Z419 Encounter for procedure for purposes other than remedying health state, unspecified: Secondary | ICD-10-CM | POA: Diagnosis not present

## 2022-05-16 DIAGNOSIS — Z419 Encounter for procedure for purposes other than remedying health state, unspecified: Secondary | ICD-10-CM | POA: Diagnosis not present

## 2022-06-14 DIAGNOSIS — Z419 Encounter for procedure for purposes other than remedying health state, unspecified: Secondary | ICD-10-CM | POA: Diagnosis not present

## 2022-07-15 DIAGNOSIS — Z419 Encounter for procedure for purposes other than remedying health state, unspecified: Secondary | ICD-10-CM | POA: Diagnosis not present

## 2022-08-14 DIAGNOSIS — Z419 Encounter for procedure for purposes other than remedying health state, unspecified: Secondary | ICD-10-CM | POA: Diagnosis not present

## 2022-09-14 DIAGNOSIS — Z419 Encounter for procedure for purposes other than remedying health state, unspecified: Secondary | ICD-10-CM | POA: Diagnosis not present

## 2022-10-14 DIAGNOSIS — Z419 Encounter for procedure for purposes other than remedying health state, unspecified: Secondary | ICD-10-CM | POA: Diagnosis not present

## 2022-11-14 DIAGNOSIS — Z419 Encounter for procedure for purposes other than remedying health state, unspecified: Secondary | ICD-10-CM | POA: Diagnosis not present

## 2022-12-15 DIAGNOSIS — Z419 Encounter for procedure for purposes other than remedying health state, unspecified: Secondary | ICD-10-CM | POA: Diagnosis not present

## 2023-01-14 DIAGNOSIS — Z419 Encounter for procedure for purposes other than remedying health state, unspecified: Secondary | ICD-10-CM | POA: Diagnosis not present

## 2023-02-14 DIAGNOSIS — Z419 Encounter for procedure for purposes other than remedying health state, unspecified: Secondary | ICD-10-CM | POA: Diagnosis not present

## 2023-03-16 DIAGNOSIS — Z419 Encounter for procedure for purposes other than remedying health state, unspecified: Secondary | ICD-10-CM | POA: Diagnosis not present

## 2023-04-16 DIAGNOSIS — Z419 Encounter for procedure for purposes other than remedying health state, unspecified: Secondary | ICD-10-CM | POA: Diagnosis not present

## 2023-05-17 DIAGNOSIS — Z419 Encounter for procedure for purposes other than remedying health state, unspecified: Secondary | ICD-10-CM | POA: Diagnosis not present

## 2023-06-14 DIAGNOSIS — Z419 Encounter for procedure for purposes other than remedying health state, unspecified: Secondary | ICD-10-CM | POA: Diagnosis not present

## 2023-07-26 DIAGNOSIS — Z419 Encounter for procedure for purposes other than remedying health state, unspecified: Secondary | ICD-10-CM | POA: Diagnosis not present

## 2023-08-25 DIAGNOSIS — Z419 Encounter for procedure for purposes other than remedying health state, unspecified: Secondary | ICD-10-CM | POA: Diagnosis not present

## 2023-09-25 DIAGNOSIS — Z419 Encounter for procedure for purposes other than remedying health state, unspecified: Secondary | ICD-10-CM | POA: Diagnosis not present

## 2023-10-25 DIAGNOSIS — Z419 Encounter for procedure for purposes other than remedying health state, unspecified: Secondary | ICD-10-CM | POA: Diagnosis not present
